# Patient Record
Sex: Female | Born: 1935 | Race: White | Hispanic: No | Marital: Married | Smoking: Never smoker
Health system: Southern US, Community
[De-identification: ages and names within clinical notes are randomized; demographics above are authoritative.]

## PROBLEM LIST (undated history)

## (undated) DIAGNOSIS — I350 Nonrheumatic aortic (valve) stenosis: Secondary | ICD-10-CM

## (undated) DIAGNOSIS — M1990 Unspecified osteoarthritis, unspecified site: Secondary | ICD-10-CM

## (undated) DIAGNOSIS — R519 Headache, unspecified: Secondary | ICD-10-CM

## (undated) DIAGNOSIS — C679 Malignant neoplasm of bladder, unspecified: Secondary | ICD-10-CM

## (undated) DIAGNOSIS — G479 Sleep disorder, unspecified: Secondary | ICD-10-CM

## (undated) DIAGNOSIS — R51 Headache: Secondary | ICD-10-CM

## (undated) DIAGNOSIS — E785 Hyperlipidemia, unspecified: Secondary | ICD-10-CM

## (undated) DIAGNOSIS — I4891 Unspecified atrial fibrillation: Secondary | ICD-10-CM

## (undated) DIAGNOSIS — I5030 Unspecified diastolic (congestive) heart failure: Secondary | ICD-10-CM

## (undated) DIAGNOSIS — Z9289 Personal history of other medical treatment: Secondary | ICD-10-CM

## (undated) DIAGNOSIS — I872 Venous insufficiency (chronic) (peripheral): Secondary | ICD-10-CM

## (undated) DIAGNOSIS — I341 Nonrheumatic mitral (valve) prolapse: Secondary | ICD-10-CM

## (undated) DIAGNOSIS — I2510 Atherosclerotic heart disease of native coronary artery without angina pectoris: Secondary | ICD-10-CM

## (undated) DIAGNOSIS — M25511 Pain in right shoulder: Secondary | ICD-10-CM

## (undated) DIAGNOSIS — K219 Gastro-esophageal reflux disease without esophagitis: Secondary | ICD-10-CM

## (undated) HISTORY — DX: Atherosclerotic heart disease of native coronary artery without angina pectoris: I25.10

## (undated) HISTORY — DX: Venous insufficiency (chronic) (peripheral): I87.2

## (undated) HISTORY — PX: CARDIOVERSION: SHX1299

## (undated) HISTORY — PX: CATARACT EXTRACTION: SUR2

## (undated) HISTORY — DX: Hyperlipidemia, unspecified: E78.5

## (undated) HISTORY — PX: BACK SURGERY: SHX140

---

## 1999-03-07 ENCOUNTER — Ambulatory Visit (HOSPITAL_COMMUNITY): Admission: RE | Admit: 1999-03-07 | Discharge: 1999-03-07 | Payer: Self-pay | Admitting: Cardiology

## 1999-09-04 ENCOUNTER — Encounter: Payer: Self-pay | Admitting: Cardiology

## 1999-09-04 ENCOUNTER — Ambulatory Visit (HOSPITAL_COMMUNITY): Admission: RE | Admit: 1999-09-04 | Discharge: 1999-09-04 | Payer: Self-pay | Admitting: Cardiology

## 2000-11-04 ENCOUNTER — Encounter: Payer: Self-pay | Admitting: Family Medicine

## 2000-11-04 ENCOUNTER — Ambulatory Visit (HOSPITAL_COMMUNITY): Admission: RE | Admit: 2000-11-04 | Discharge: 2000-11-04 | Payer: Self-pay | Admitting: Family Medicine

## 2001-02-22 ENCOUNTER — Other Ambulatory Visit: Admission: RE | Admit: 2001-02-22 | Discharge: 2001-02-22 | Payer: Self-pay | Admitting: Family Medicine

## 2001-04-15 ENCOUNTER — Ambulatory Visit (HOSPITAL_COMMUNITY): Admission: RE | Admit: 2001-04-15 | Discharge: 2001-04-15 | Payer: Self-pay | Admitting: Family Medicine

## 2001-04-15 ENCOUNTER — Encounter: Payer: Self-pay | Admitting: Family Medicine

## 2001-04-22 ENCOUNTER — Ambulatory Visit (HOSPITAL_COMMUNITY): Admission: RE | Admit: 2001-04-22 | Discharge: 2001-04-22 | Payer: Self-pay | Admitting: Cardiology

## 2001-04-22 ENCOUNTER — Encounter: Payer: Self-pay | Admitting: Cardiology

## 2001-11-08 ENCOUNTER — Ambulatory Visit (HOSPITAL_COMMUNITY): Admission: RE | Admit: 2001-11-08 | Discharge: 2001-11-08 | Payer: Self-pay | Admitting: Family Medicine

## 2001-11-08 ENCOUNTER — Encounter: Payer: Self-pay | Admitting: Family Medicine

## 2002-02-21 ENCOUNTER — Ambulatory Visit (HOSPITAL_COMMUNITY): Admission: RE | Admit: 2002-02-21 | Discharge: 2002-02-21 | Payer: Self-pay | Admitting: Family Medicine

## 2002-02-21 ENCOUNTER — Encounter: Payer: Self-pay | Admitting: Family Medicine

## 2002-05-06 ENCOUNTER — Encounter: Payer: Self-pay | Admitting: Internal Medicine

## 2002-05-06 ENCOUNTER — Observation Stay (HOSPITAL_COMMUNITY): Admission: EM | Admit: 2002-05-06 | Discharge: 2002-05-07 | Payer: Self-pay | Admitting: Cardiology

## 2002-09-18 ENCOUNTER — Observation Stay (HOSPITAL_COMMUNITY): Admission: EM | Admit: 2002-09-18 | Discharge: 2002-09-20 | Payer: Self-pay | Admitting: Emergency Medicine

## 2002-09-18 ENCOUNTER — Encounter: Payer: Self-pay | Admitting: Emergency Medicine

## 2002-10-26 ENCOUNTER — Ambulatory Visit (HOSPITAL_COMMUNITY): Admission: RE | Admit: 2002-10-26 | Discharge: 2002-10-26 | Payer: Self-pay | Admitting: Family Medicine

## 2002-10-26 ENCOUNTER — Encounter: Payer: Self-pay | Admitting: Family Medicine

## 2002-10-31 ENCOUNTER — Ambulatory Visit (HOSPITAL_COMMUNITY): Admission: RE | Admit: 2002-10-31 | Discharge: 2002-10-31 | Payer: Self-pay | Admitting: Family Medicine

## 2002-10-31 ENCOUNTER — Encounter: Payer: Self-pay | Admitting: Family Medicine

## 2003-03-27 ENCOUNTER — Emergency Department (HOSPITAL_COMMUNITY): Admission: EM | Admit: 2003-03-27 | Discharge: 2003-03-27 | Payer: Self-pay | Admitting: *Deleted

## 2003-03-27 ENCOUNTER — Encounter: Payer: Self-pay | Admitting: *Deleted

## 2003-05-19 ENCOUNTER — Encounter: Payer: Self-pay | Admitting: Cardiology

## 2003-10-11 ENCOUNTER — Ambulatory Visit (HOSPITAL_COMMUNITY): Admission: RE | Admit: 2003-10-11 | Discharge: 2003-10-11 | Payer: Self-pay | Admitting: Specialist

## 2003-10-18 ENCOUNTER — Ambulatory Visit (HOSPITAL_COMMUNITY): Admission: RE | Admit: 2003-10-18 | Discharge: 2003-10-18 | Payer: Self-pay | Admitting: Family Medicine

## 2003-10-30 ENCOUNTER — Ambulatory Visit (HOSPITAL_COMMUNITY): Admission: RE | Admit: 2003-10-30 | Discharge: 2003-10-30 | Payer: Self-pay | Admitting: Family Medicine

## 2003-11-15 ENCOUNTER — Ambulatory Visit (HOSPITAL_COMMUNITY): Admission: RE | Admit: 2003-11-15 | Discharge: 2003-11-15 | Payer: Self-pay | Admitting: Specialist

## 2004-05-24 ENCOUNTER — Ambulatory Visit: Payer: Self-pay | Admitting: Cardiovascular Disease

## 2004-06-18 ENCOUNTER — Ambulatory Visit: Payer: Self-pay | Admitting: Cardiology

## 2004-06-27 ENCOUNTER — Ambulatory Visit: Payer: Self-pay | Admitting: Cardiology

## 2004-07-02 ENCOUNTER — Ambulatory Visit: Payer: Self-pay | Admitting: Cardiology

## 2004-07-23 ENCOUNTER — Ambulatory Visit: Payer: Self-pay | Admitting: *Deleted

## 2004-08-01 ENCOUNTER — Ambulatory Visit: Payer: Self-pay | Admitting: Internal Medicine

## 2004-08-15 ENCOUNTER — Ambulatory Visit: Payer: Self-pay | Admitting: Internal Medicine

## 2004-08-29 ENCOUNTER — Ambulatory Visit: Payer: Self-pay | Admitting: Cardiology

## 2004-09-19 ENCOUNTER — Ambulatory Visit: Payer: Self-pay | Admitting: Cardiology

## 2004-10-03 ENCOUNTER — Ambulatory Visit: Payer: Self-pay | Admitting: Cardiology

## 2004-10-15 ENCOUNTER — Ambulatory Visit: Payer: Self-pay

## 2004-10-17 ENCOUNTER — Ambulatory Visit: Payer: Self-pay | Admitting: Internal Medicine

## 2004-10-23 ENCOUNTER — Ambulatory Visit: Payer: Self-pay | Admitting: Cardiology

## 2004-10-31 ENCOUNTER — Ambulatory Visit: Payer: Self-pay

## 2004-11-07 ENCOUNTER — Ambulatory Visit (HOSPITAL_COMMUNITY): Admission: RE | Admit: 2004-11-07 | Discharge: 2004-11-07 | Payer: Self-pay | Admitting: Family Medicine

## 2004-11-14 ENCOUNTER — Ambulatory Visit: Payer: Self-pay | Admitting: Cardiology

## 2004-11-19 ENCOUNTER — Ambulatory Visit: Payer: Self-pay | Admitting: Cardiology

## 2004-11-20 ENCOUNTER — Ambulatory Visit: Payer: Self-pay | Admitting: Orthopedic Surgery

## 2004-12-12 ENCOUNTER — Ambulatory Visit: Payer: Self-pay | Admitting: Cardiology

## 2005-01-09 ENCOUNTER — Ambulatory Visit: Payer: Self-pay | Admitting: Cardiology

## 2005-01-23 ENCOUNTER — Ambulatory Visit: Payer: Self-pay | Admitting: Internal Medicine

## 2005-02-20 ENCOUNTER — Ambulatory Visit: Payer: Self-pay | Admitting: Internal Medicine

## 2005-03-13 ENCOUNTER — Ambulatory Visit: Payer: Self-pay | Admitting: Cardiology

## 2005-03-20 ENCOUNTER — Ambulatory Visit: Payer: Self-pay | Admitting: Cardiology

## 2005-03-27 ENCOUNTER — Ambulatory Visit: Payer: Self-pay | Admitting: Cardiology

## 2005-04-02 ENCOUNTER — Ambulatory Visit (HOSPITAL_COMMUNITY): Admission: RE | Admit: 2005-04-02 | Discharge: 2005-04-02 | Payer: Self-pay | Admitting: Family Medicine

## 2005-04-10 ENCOUNTER — Ambulatory Visit: Payer: Self-pay | Admitting: Cardiology

## 2005-05-01 ENCOUNTER — Ambulatory Visit: Payer: Self-pay | Admitting: Cardiology

## 2005-05-29 ENCOUNTER — Ambulatory Visit: Payer: Self-pay | Admitting: Cardiology

## 2005-06-26 ENCOUNTER — Ambulatory Visit: Payer: Self-pay | Admitting: Cardiology

## 2005-07-09 ENCOUNTER — Ambulatory Visit: Payer: Self-pay | Admitting: Internal Medicine

## 2005-07-24 ENCOUNTER — Ambulatory Visit: Payer: Self-pay | Admitting: Cardiology

## 2005-08-06 ENCOUNTER — Ambulatory Visit: Payer: Self-pay | Admitting: Cardiology

## 2005-09-03 ENCOUNTER — Ambulatory Visit: Payer: Self-pay | Admitting: Internal Medicine

## 2005-10-01 ENCOUNTER — Ambulatory Visit: Payer: Self-pay | Admitting: Cardiology

## 2005-10-22 ENCOUNTER — Ambulatory Visit: Payer: Self-pay | Admitting: Cardiology

## 2005-11-05 ENCOUNTER — Ambulatory Visit: Payer: Self-pay | Admitting: *Deleted

## 2005-11-26 ENCOUNTER — Ambulatory Visit: Payer: Self-pay | Admitting: Cardiology

## 2005-12-24 ENCOUNTER — Ambulatory Visit: Payer: Self-pay | Admitting: Cardiology

## 2006-01-20 ENCOUNTER — Ambulatory Visit: Payer: Self-pay | Admitting: Internal Medicine

## 2006-01-20 ENCOUNTER — Ambulatory Visit: Payer: Self-pay

## 2006-02-09 ENCOUNTER — Ambulatory Visit: Payer: Self-pay | Admitting: Cardiology

## 2006-03-10 ENCOUNTER — Ambulatory Visit: Payer: Self-pay | Admitting: Cardiology

## 2006-04-06 ENCOUNTER — Ambulatory Visit (HOSPITAL_COMMUNITY): Admission: RE | Admit: 2006-04-06 | Discharge: 2006-04-06 | Payer: Self-pay | Admitting: Family Medicine

## 2006-04-07 ENCOUNTER — Ambulatory Visit: Payer: Self-pay | Admitting: Cardiology

## 2006-04-24 ENCOUNTER — Ambulatory Visit: Payer: Self-pay | Admitting: Cardiovascular Disease

## 2006-05-15 ENCOUNTER — Ambulatory Visit: Payer: Self-pay | Admitting: Cardiology

## 2006-05-20 ENCOUNTER — Ambulatory Visit: Payer: Self-pay | Admitting: Cardiology

## 2006-06-17 ENCOUNTER — Ambulatory Visit: Payer: Self-pay | Admitting: Cardiovascular Disease

## 2006-07-17 ENCOUNTER — Ambulatory Visit: Payer: Self-pay | Admitting: Cardiology

## 2006-08-14 ENCOUNTER — Ambulatory Visit: Payer: Self-pay | Admitting: Internal Medicine

## 2006-09-04 ENCOUNTER — Ambulatory Visit: Payer: Self-pay | Admitting: Cardiology

## 2006-10-02 ENCOUNTER — Ambulatory Visit: Payer: Self-pay | Admitting: Cardiovascular Disease

## 2006-11-02 ENCOUNTER — Ambulatory Visit: Payer: Self-pay | Admitting: Cardiology

## 2006-11-02 LAB — CONVERTED CEMR LAB
BUN: 25 mg/dL — ABNORMAL HIGH (ref 6–23)
Basophils Relative: 0.6 % (ref 0.0–1.0)
CO2: 31 meq/L (ref 19–32)
Creatinine, Ser: 1 mg/dL (ref 0.4–1.2)
Eosinophils Relative: 1.9 % (ref 0.0–5.0)
GFR calc Af Amer: 69 mL/min
GFR calc non Af Amer: 57 mL/min
Glucose, Bld: 70 mg/dL (ref 70–99)
MCV: 87.4 fL (ref 78.0–100.0)
Monocytes Absolute: 0.6 10*3/uL (ref 0.2–0.7)
Neutrophils Relative %: 56.9 % (ref 43.0–77.0)
Platelets: 218 10*3/uL (ref 150–400)
Potassium: 4.7 meq/L (ref 3.5–5.1)
RBC: 3.98 M/uL (ref 3.87–5.11)
RDW: 14.9 % — ABNORMAL HIGH (ref 11.5–14.6)
Sodium: 143 meq/L (ref 135–145)

## 2006-11-16 ENCOUNTER — Encounter: Payer: Self-pay | Admitting: Cardiology

## 2006-11-16 ENCOUNTER — Ambulatory Visit: Payer: Self-pay

## 2006-11-16 ENCOUNTER — Ambulatory Visit: Payer: Self-pay | Admitting: Cardiology

## 2006-11-16 LAB — CONVERTED CEMR LAB
BUN: 37 mg/dL — ABNORMAL HIGH (ref 6–23)
CO2: 33 meq/L — ABNORMAL HIGH (ref 19–32)
Creatinine, Ser: 1.2 mg/dL (ref 0.4–1.2)
GFR calc Af Amer: 56 mL/min
Glucose, Bld: 77 mg/dL (ref 70–99)

## 2006-12-01 ENCOUNTER — Ambulatory Visit: Payer: Self-pay | Admitting: Cardiology

## 2006-12-28 ENCOUNTER — Ambulatory Visit: Payer: Self-pay | Admitting: Internal Medicine

## 2007-01-19 ENCOUNTER — Ambulatory Visit: Payer: Self-pay | Admitting: Cardiology

## 2007-02-09 ENCOUNTER — Ambulatory Visit: Payer: Self-pay | Admitting: Internal Medicine

## 2007-02-23 ENCOUNTER — Ambulatory Visit: Payer: Self-pay | Admitting: Cardiology

## 2007-03-16 ENCOUNTER — Ambulatory Visit: Payer: Self-pay | Admitting: Cardiology

## 2007-04-06 ENCOUNTER — Ambulatory Visit: Payer: Self-pay | Admitting: Cardiology

## 2007-04-08 ENCOUNTER — Ambulatory Visit (HOSPITAL_COMMUNITY): Admission: RE | Admit: 2007-04-08 | Discharge: 2007-04-08 | Payer: Self-pay | Admitting: Family Medicine

## 2007-04-21 ENCOUNTER — Ambulatory Visit: Payer: Self-pay | Admitting: Cardiology

## 2007-04-21 LAB — CONVERTED CEMR LAB
Basophils Absolute: 0 10*3/uL (ref 0.0–0.1)
Basophils Relative: 0.1 % (ref 0.0–1.0)
Calcium: 8.9 mg/dL (ref 8.4–10.5)
Chloride: 102 meq/L (ref 96–112)
Creatinine, Ser: 1.1 mg/dL (ref 0.4–1.2)
Eosinophils Relative: 1.1 % (ref 0.0–5.0)
GFR calc Af Amer: 62 mL/min
HCT: 38.6 % (ref 36.0–46.0)
Monocytes Absolute: 0.7 10*3/uL (ref 0.2–0.7)
Monocytes Relative: 9 % (ref 3.0–11.0)
Neutro Abs: 5.2 10*3/uL (ref 1.4–7.7)
Potassium: 4.4 meq/L (ref 3.5–5.1)
RBC: 4.22 M/uL (ref 3.87–5.11)
WBC: 7.9 10*3/uL (ref 4.5–10.5)

## 2007-04-28 ENCOUNTER — Ambulatory Visit: Payer: Self-pay | Admitting: Internal Medicine

## 2007-05-12 ENCOUNTER — Ambulatory Visit: Payer: Self-pay | Admitting: Cardiology

## 2007-05-26 ENCOUNTER — Ambulatory Visit: Payer: Self-pay | Admitting: Cardiology

## 2007-06-23 ENCOUNTER — Ambulatory Visit: Payer: Self-pay | Admitting: Cardiology

## 2007-07-06 ENCOUNTER — Ambulatory Visit: Payer: Self-pay | Admitting: Internal Medicine

## 2007-07-28 ENCOUNTER — Ambulatory Visit: Payer: Self-pay | Admitting: Cardiovascular Disease

## 2007-08-12 ENCOUNTER — Ambulatory Visit: Payer: Self-pay | Admitting: Cardiology

## 2007-09-02 ENCOUNTER — Ambulatory Visit: Payer: Self-pay | Admitting: Cardiology

## 2007-09-30 ENCOUNTER — Ambulatory Visit: Payer: Self-pay | Admitting: Cardiology

## 2007-10-28 ENCOUNTER — Ambulatory Visit: Payer: Self-pay | Admitting: Internal Medicine

## 2007-10-28 ENCOUNTER — Ambulatory Visit: Payer: Self-pay | Admitting: Cardiology

## 2007-10-28 LAB — CONVERTED CEMR LAB
Basophils Relative: 0 % (ref 0.0–1.0)
Calcium: 9.2 mg/dL (ref 8.4–10.5)
Chloride: 102 meq/L (ref 96–112)
Creatinine, Ser: 1.5 mg/dL — ABNORMAL HIGH (ref 0.4–1.2)
Eosinophils Relative: 1.8 % (ref 0.0–5.0)
GFR calc Af Amer: 43 mL/min
GFR calc non Af Amer: 36 mL/min
Glucose, Bld: 103 mg/dL — ABNORMAL HIGH (ref 70–99)
HCT: 36.3 % (ref 36.0–46.0)
Hemoglobin: 11.8 g/dL — ABNORMAL LOW (ref 12.0–15.0)
MCHC: 32.5 g/dL (ref 30.0–36.0)
MCV: 89.7 fL (ref 78.0–100.0)
Monocytes Relative: 9.9 % (ref 3.0–12.0)
Neutro Abs: 2.7 10*3/uL (ref 1.4–7.7)
Neutrophils Relative %: 52.1 % (ref 43.0–77.0)
RBC: 4.05 M/uL (ref 3.87–5.11)
RDW: 13.2 % (ref 11.5–14.6)

## 2007-11-11 ENCOUNTER — Ambulatory Visit: Payer: Self-pay | Admitting: Internal Medicine

## 2007-11-25 ENCOUNTER — Ambulatory Visit: Payer: Self-pay | Admitting: Internal Medicine

## 2007-12-16 ENCOUNTER — Ambulatory Visit: Payer: Self-pay | Admitting: Cardiology

## 2008-01-13 ENCOUNTER — Ambulatory Visit: Payer: Self-pay | Admitting: Cardiology

## 2008-02-10 ENCOUNTER — Ambulatory Visit: Payer: Self-pay | Admitting: Internal Medicine

## 2008-03-02 ENCOUNTER — Ambulatory Visit: Payer: Self-pay | Admitting: Internal Medicine

## 2008-03-16 ENCOUNTER — Ambulatory Visit: Payer: Self-pay | Admitting: Cardiology

## 2008-03-31 ENCOUNTER — Ambulatory Visit (HOSPITAL_COMMUNITY): Admission: RE | Admit: 2008-03-31 | Discharge: 2008-03-31 | Payer: Self-pay | Admitting: Family Medicine

## 2008-04-06 ENCOUNTER — Ambulatory Visit: Payer: Self-pay | Admitting: Cardiology

## 2008-04-11 ENCOUNTER — Ambulatory Visit (HOSPITAL_COMMUNITY): Admission: RE | Admit: 2008-04-11 | Discharge: 2008-04-11 | Payer: Self-pay | Admitting: Family Medicine

## 2008-04-27 ENCOUNTER — Ambulatory Visit: Payer: Self-pay | Admitting: Cardiology

## 2008-05-25 ENCOUNTER — Ambulatory Visit: Payer: Self-pay | Admitting: Cardiology

## 2008-06-16 ENCOUNTER — Ambulatory Visit: Payer: Self-pay | Admitting: Internal Medicine

## 2008-07-17 ENCOUNTER — Ambulatory Visit: Payer: Self-pay | Admitting: Cardiology

## 2008-08-08 ENCOUNTER — Ambulatory Visit (HOSPITAL_COMMUNITY): Admission: RE | Admit: 2008-08-08 | Discharge: 2008-08-08 | Payer: Self-pay | Admitting: Family Medicine

## 2008-08-14 ENCOUNTER — Ambulatory Visit: Payer: Self-pay | Admitting: Cardiology

## 2008-09-20 ENCOUNTER — Encounter (INDEPENDENT_AMBULATORY_CARE_PROVIDER_SITE_OTHER): Payer: Self-pay | Admitting: *Deleted

## 2008-09-21 ENCOUNTER — Encounter: Payer: Self-pay | Admitting: Cardiology

## 2008-09-21 ENCOUNTER — Ambulatory Visit: Payer: Self-pay | Admitting: Cardiovascular Disease

## 2008-09-21 ENCOUNTER — Ambulatory Visit: Payer: Self-pay | Admitting: Cardiology

## 2008-10-12 ENCOUNTER — Ambulatory Visit: Payer: Self-pay | Admitting: Cardiology

## 2008-10-12 LAB — CONVERTED CEMR LAB
ALT: 14 units/L (ref 0–35)
AST: 19 units/L (ref 0–37)
Albumin: 3.5 g/dL (ref 3.5–5.2)
Alkaline Phosphatase: 75 units/L (ref 39–117)
BUN: 41 mg/dL — ABNORMAL HIGH (ref 6–23)
Basophils Absolute: 0 10*3/uL (ref 0.0–0.1)
Basophils Relative: 0.1 % (ref 0.0–3.0)
CO2: 32 meq/L (ref 19–32)
Calcium: 9.2 mg/dL (ref 8.4–10.5)
Chloride: 102 meq/L (ref 96–112)
GFR calc non Af Amer: 38.63 mL/min (ref >60)
Glucose, Bld: 106 mg/dL — ABNORMAL HIGH (ref 70–99)
HDL: 56.5 mg/dL (ref >39.00)
Monocytes Relative: 13.4 % — ABNORMAL HIGH (ref 3.0–12.0)
Neutro Abs: 2.3 10*3/uL (ref 1.4–7.7)
Platelets: 162 10*3/uL (ref 150.0–400.0)
Triglycerides: 78 mg/dL (ref 0.0–149.0)
WBC: 4.5 10*3/uL (ref 4.5–10.5)

## 2008-11-06 ENCOUNTER — Ambulatory Visit: Payer: Self-pay | Admitting: Cardiovascular Disease

## 2008-11-20 ENCOUNTER — Ambulatory Visit: Payer: Self-pay | Admitting: Cardiology

## 2008-12-19 ENCOUNTER — Encounter: Payer: Self-pay | Admitting: *Deleted

## 2008-12-20 ENCOUNTER — Ambulatory Visit: Payer: Self-pay | Admitting: Cardiology

## 2008-12-20 LAB — CONVERTED CEMR LAB
POC INR: 1.9
Protime: 1.9

## 2009-01-10 ENCOUNTER — Ambulatory Visit: Payer: Self-pay | Admitting: Internal Medicine

## 2009-01-24 ENCOUNTER — Encounter: Payer: Self-pay | Admitting: *Deleted

## 2009-01-31 ENCOUNTER — Ambulatory Visit: Payer: Self-pay | Admitting: Internal Medicine

## 2009-01-31 LAB — CONVERTED CEMR LAB: POC INR: 1.7

## 2009-02-14 ENCOUNTER — Ambulatory Visit: Payer: Self-pay | Admitting: Cardiology

## 2009-02-14 LAB — CONVERTED CEMR LAB: Prothrombin Time: 18.1 s

## 2009-03-14 ENCOUNTER — Ambulatory Visit: Payer: Self-pay | Admitting: Internal Medicine

## 2009-04-11 ENCOUNTER — Ambulatory Visit: Payer: Self-pay | Admitting: Internal Medicine

## 2009-04-11 LAB — CONVERTED CEMR LAB: POC INR: 2.2

## 2009-04-13 ENCOUNTER — Ambulatory Visit (HOSPITAL_COMMUNITY): Admission: RE | Admit: 2009-04-13 | Discharge: 2009-04-13 | Payer: Self-pay | Admitting: Family Medicine

## 2009-05-09 ENCOUNTER — Ambulatory Visit: Payer: Self-pay | Admitting: Internal Medicine

## 2009-05-29 ENCOUNTER — Telehealth: Payer: Self-pay | Admitting: Cardiology

## 2009-06-06 ENCOUNTER — Ambulatory Visit: Payer: Self-pay | Admitting: Cardiovascular Disease

## 2009-06-06 LAB — CONVERTED CEMR LAB: POC INR: 1.8

## 2009-06-08 ENCOUNTER — Telehealth: Payer: Self-pay | Admitting: Cardiology

## 2009-06-26 ENCOUNTER — Telehealth (INDEPENDENT_AMBULATORY_CARE_PROVIDER_SITE_OTHER): Payer: Self-pay | Admitting: *Deleted

## 2009-07-04 ENCOUNTER — Encounter (INDEPENDENT_AMBULATORY_CARE_PROVIDER_SITE_OTHER): Payer: Self-pay | Admitting: Cardiology

## 2009-07-04 ENCOUNTER — Ambulatory Visit: Payer: Self-pay | Admitting: Cardiology

## 2009-07-04 LAB — CONVERTED CEMR LAB: POC INR: 1.8

## 2009-07-25 ENCOUNTER — Encounter (INDEPENDENT_AMBULATORY_CARE_PROVIDER_SITE_OTHER): Payer: Self-pay | Admitting: Cardiology

## 2009-07-25 ENCOUNTER — Ambulatory Visit: Payer: Self-pay | Admitting: Cardiovascular Disease

## 2009-07-25 LAB — CONVERTED CEMR LAB: POC INR: 2.1

## 2009-08-22 ENCOUNTER — Ambulatory Visit: Payer: Self-pay | Admitting: Internal Medicine

## 2009-08-22 LAB — CONVERTED CEMR LAB: POC INR: 2.3

## 2009-09-20 DIAGNOSIS — I5031 Acute diastolic (congestive) heart failure: Secondary | ICD-10-CM | POA: Insufficient documentation

## 2009-09-20 DIAGNOSIS — I2510 Atherosclerotic heart disease of native coronary artery without angina pectoris: Secondary | ICD-10-CM | POA: Insufficient documentation

## 2009-09-20 DIAGNOSIS — E785 Hyperlipidemia, unspecified: Secondary | ICD-10-CM

## 2009-09-20 DIAGNOSIS — I10 Essential (primary) hypertension: Secondary | ICD-10-CM

## 2009-09-20 DIAGNOSIS — I4891 Unspecified atrial fibrillation: Secondary | ICD-10-CM

## 2009-09-20 DIAGNOSIS — I872 Venous insufficiency (chronic) (peripheral): Secondary | ICD-10-CM | POA: Insufficient documentation

## 2009-09-21 ENCOUNTER — Ambulatory Visit: Payer: Self-pay | Admitting: Cardiology

## 2009-10-19 ENCOUNTER — Ambulatory Visit: Payer: Self-pay | Admitting: Internal Medicine

## 2009-10-19 LAB — CONVERTED CEMR LAB: POC INR: 2

## 2009-10-22 ENCOUNTER — Ambulatory Visit: Payer: Self-pay | Admitting: Cardiology

## 2009-10-26 LAB — CONVERTED CEMR LAB
Albumin: 3.7 g/dL (ref 3.5–5.2)
Calcium: 9.2 mg/dL (ref 8.4–10.5)
Cholesterol: 123 mg/dL (ref 0–200)
Eosinophils Relative: 2.7 % (ref 0.0–5.0)
GFR calc non Af Amer: 46.03 mL/min (ref >60)
Glucose, Bld: 109 mg/dL — ABNORMAL HIGH (ref 70–99)
HCT: 35 % — ABNORMAL LOW (ref 36.0–46.0)
HDL: 61.7 mg/dL (ref >39.00)
Hemoglobin: 12 g/dL (ref 12.0–15.0)
LDL Cholesterol: 50 mg/dL (ref 0–99)
Monocytes Absolute: 0.4 10*3/uL (ref 0.1–1.0)
Monocytes Relative: 10.6 % (ref 3.0–12.0)
Neutrophils Relative %: 50.8 % (ref 43.0–77.0)
Platelets: 163 10*3/uL (ref 150.0–400.0)
Potassium: 4 meq/L (ref 3.5–5.1)
RBC: 3.42 M/uL — ABNORMAL LOW (ref 3.87–5.11)
RDW: 14.6 % (ref 11.5–14.6)
Triglycerides: 58 mg/dL (ref 0.0–149.0)
VLDL: 11.6 mg/dL (ref 0.0–40.0)

## 2009-11-16 ENCOUNTER — Ambulatory Visit: Payer: Self-pay | Admitting: Cardiology

## 2009-11-16 LAB — CONVERTED CEMR LAB: POC INR: 2.6

## 2009-11-26 ENCOUNTER — Telehealth: Payer: Self-pay | Admitting: Cardiology

## 2009-12-14 ENCOUNTER — Ambulatory Visit: Payer: Self-pay | Admitting: Cardiology

## 2009-12-14 LAB — CONVERTED CEMR LAB: POC INR: 2.7

## 2010-01-11 ENCOUNTER — Ambulatory Visit: Payer: Self-pay | Admitting: Cardiology

## 2010-01-11 LAB — CONVERTED CEMR LAB: POC INR: 2.4

## 2010-02-08 ENCOUNTER — Ambulatory Visit: Payer: Self-pay | Admitting: Cardiology

## 2010-02-08 LAB — CONVERTED CEMR LAB: POC INR: 2.2

## 2010-03-08 ENCOUNTER — Ambulatory Visit: Payer: Self-pay | Admitting: Cardiology

## 2010-03-26 ENCOUNTER — Encounter: Payer: Self-pay | Admitting: Cardiology

## 2010-04-08 ENCOUNTER — Encounter: Payer: Self-pay | Admitting: Cardiology

## 2010-04-10 ENCOUNTER — Ambulatory Visit: Payer: Self-pay | Admitting: Cardiology

## 2010-04-11 LAB — CONVERTED CEMR LAB: TSH: 2.53 microintl units/mL (ref 0.35–5.50)

## 2010-04-15 ENCOUNTER — Ambulatory Visit (HOSPITAL_COMMUNITY): Admission: RE | Admit: 2010-04-15 | Discharge: 2010-04-15 | Payer: Self-pay | Admitting: Family Medicine

## 2010-04-23 ENCOUNTER — Ambulatory Visit (HOSPITAL_COMMUNITY): Admission: RE | Admit: 2010-04-23 | Discharge: 2010-04-23 | Payer: Self-pay | Admitting: Family Medicine

## 2010-04-29 ENCOUNTER — Ambulatory Visit (HOSPITAL_COMMUNITY): Admission: RE | Admit: 2010-04-29 | Discharge: 2010-04-29 | Payer: Self-pay | Admitting: Family Medicine

## 2010-05-10 ENCOUNTER — Ambulatory Visit: Payer: Self-pay | Admitting: Cardiovascular Disease

## 2010-05-10 ENCOUNTER — Encounter: Payer: Self-pay | Admitting: Cardiology

## 2010-05-10 ENCOUNTER — Ambulatory Visit: Payer: Self-pay

## 2010-05-10 ENCOUNTER — Ambulatory Visit: Payer: Self-pay | Admitting: Internal Medicine

## 2010-05-10 ENCOUNTER — Ambulatory Visit (HOSPITAL_COMMUNITY): Admission: RE | Admit: 2010-05-10 | Discharge: 2010-05-10 | Payer: Self-pay | Admitting: Cardiology

## 2010-05-10 LAB — CONVERTED CEMR LAB: POC INR: 2.4

## 2010-06-07 ENCOUNTER — Ambulatory Visit: Payer: Self-pay | Admitting: Cardiology

## 2010-07-05 ENCOUNTER — Ambulatory Visit: Payer: Self-pay | Admitting: Cardiology

## 2010-07-25 ENCOUNTER — Telehealth: Payer: Self-pay | Admitting: Cardiovascular Disease

## 2010-08-02 ENCOUNTER — Ambulatory Visit: Admission: RE | Admit: 2010-08-02 | Discharge: 2010-08-02 | Payer: Self-pay | Source: Home / Self Care

## 2010-08-18 LAB — CONVERTED CEMR LAB: POC INR: 2.4

## 2010-08-22 NOTE — Letter (Signed)
Summary: Handout Printed  Printed Handout:  - Coumadin Instructions-w/out Meds 

## 2010-08-22 NOTE — Progress Notes (Signed)
Summary: PT CALLING ABOUT LAB WORK   Phone Note Call from Patient Call back at Home Phone [phone]   Caller: Patient Summary of Call: Pt calling regarding lab work. Initial call taken by: Judie Grieve,  Nov 26, 2009 1:13 PM  Follow-up for Phone Call        I spoke with patient and she would like her recent lab work faxed to Dr. Megan Mans.  I will fax that toda

## 2010-08-22 NOTE — Medication Information (Signed)
Summary: [email or web address]  Anticoagulant Therapy  Managed by: Shelby Dubin, PharmD, BCPS, CPP Referring MD: Charlies Constable MD Supervising MD: Clifton James MD, Cristal Deer Indication 1: Atrial Fibrillation (ICD-427.31) Lab Used: [facility] Site: [facility] INR POC 2.1 INR RANGE 2 - 3  Dietary changes: no    Health status changes: no    Bleeding/hemorrhagic complications: no    Recent/future hospitalizations: no    Any changes in medication regimen? no    Recent/future dental: no  Any missed doses?: no       Is patient compliant with meds? yes       Current Medications (verified): 1)  Metoprolol Succinate 50 Mg Xr24h-Tab (Metoprolol Succinate) .... Take 1  Tab Daily and An Extra Half At Night If Needed 2)  Diltiazem Hcl 120 Mg Tabs (Diltiazem Hcl) .... One Tab A Day 3)  Furosemide 80 Mg Tabs (Furosemide) .... Take One Tablet By Mouth Daily. 4)  Aspirin 81 Mg Tbec (Aspirin) .... Take One Tablet By Mouth Daily 5)  Enalapril Maleate 10 Mg Tabs (Enalapril Maleate) 6)  Crestor 20 Mg Tabs (Rosuvastatin Calcium) .... Take One Tablet By Mouth Daily. 7)  Niaspan 500 Mg Cr-Tabs (Niacin (Antihyperlipidemic)) .Marland Kitchen.. 1 Tab Daily 8)  Allopurinol 100 Mg Tabs (Allopurinol) .Marland Kitchen.. 1 By Mouth Daily 9)  Calcium With Vitamin D 600mg  .... One By Mouth Daily 10)  Coumadin 5 Mg Tabs (Warfarin Sodium) .... Take As Directed By Coumadin Clinic.  Allergies (verified): 1)  ! Pcn 2)  ! Amoxicillin 3)  ! Sulfa  Anticoagulation Management History:      The patient is taking warfarin and comes in today for a routine follow up visit.  Positive risk factors for bleeding include an age of 61 years or older.  The bleeding index is 'intermediate risk'.  Positive CHADS2 values include Age > 25 years old.  The start date was 06/03/2002.  Anticoagulation responsible provider: Clifton James MD, Cristal Deer.  INR POC: 2.1.  Cuvette Lot#: [id number].  Exp: 08/2010.    Anticoagulation Management Assessment/Plan:      The patient's  current anticoagulation dose is Coumadin 5 mg tabs: Take as directed by coumadin clinic..  The target INR is 2.0-3.0.  The next INR is due 08/22/2009.  Anticoagulation instructions were given to patient.  Results were reviewed/authorized by Shelby Dubin, PharmD, BCPS, CPP.  She was notified by Shelby Dubin PharmD, BCPS, CPP.         Prior Anticoagulation Instructions: INR 1.8  Take 1 tab today, then resume normal dosing of 0.5 tab daily except on Tuesdays and Saturdays.  Recheck in 3 weeks.    Current Anticoagulation Instructions: INR 2.1  Continue taking 0.5 tab daily except 1 tab each Tuesday and Saturday.  Recheck in 4 weeks.

## 2010-08-22 NOTE — Medication Information (Signed)
Summary: rov/cb  Anticoagulant Therapy  Managed by: Bethena Midget, RN, BSN Referring MD: Charlies Constable MD PCP: Soyla Murphy Supervising MD: Juanda Chance MD, Elayna Tobler Indication 1: Atrial Fibrillation (ICD-427.31) Lab Used: [facility] Site: [facility] INR POC 2.6 INR RANGE 2 - 3  Dietary changes: no    Health status changes: no    Bleeding/hemorrhagic complications: no    Recent/future hospitalizations: no    Any changes in medication regimen? no    Recent/future dental: no  Any missed doses?: no       Is patient compliant with meds? yes       Allergies: 1)  ! Pcn 2)  ! Amoxicillin 3)  ! Sulfa  Anticoagulation Management History:      The patient is taking warfarin and comes in today for a routine follow up visit.  Positive risk factors for bleeding include an age of 75 years or older.  The bleeding index is 'intermediate risk'.  Positive CHADS2 values include History of CHF, History of HTN, and Age > 76 years old.  The start date was 06/03/2002.  Anticoagulation responsible provider: Juanda Chance MD, Smitty Cords.  INR POC: 2.6.  Cuvette Lot#: [id number].  Exp: 12/2010.    Anticoagulation Management Assessment/Plan:      The patient's current anticoagulation dose is Coumadin 5 mg tabs: Take as directed by coumadin clinic..  The target INR is 2.0-3.0.  The next INR is due 12/14/2009.  Anticoagulation instructions were given to patient.  Results were reviewed/authorized by Bethena Midget, RN, BSN.  She was notified by Bethena Midget, RN, BSN.         Prior Anticoagulation Instructions: INR 2.0. Take 0.5 tablet daily except 1 tablet on Tues and Sat.  Current Anticoagulation Instructions: INR 2.6 Continue 2.5mg s daily except 5mg s on Tuesdays and Saturdays. Reheck in 4 weeks.

## 2010-08-22 NOTE — Assessment & Plan Note (Signed)
Summary: f70m  Medications Added ATENOLOL 100 MG TABS (ATENOLOL) Take one tablet by mouth daily CALCIUM 600+D PLUS MINERALS 600-400 MG-UNIT TABS (CALCIUM CARBONATE-VIT D-MIN)  CALCIUM 600+D PLUS MINERALS 600-400 MG-UNIT TABS (CALCIUM CARBONATE-VIT D-MIN) Take 1 tablet by mouth once a day        Visit Type:  6 months follow up Primary Provider:  Butch Penny  CC:  palpitations and chest pains.  History of Present Illness: The patient is 77 years and returned for management of atrial fibrillation. She has chronic atrial fibrillation which is managed with rate control medications and Coumadin. She also has a history of diastolic CHF. She's had nonobstructive CAD a catheterization in the past.  She says she's been doing fairly well although she does say she has some feeling of palpitations and rapid heartbeat from time to time. Her other problems include hypertension and hyperlipidemia.  Current Medications (verified): 1)  Metoprolol Succinate 50 Mg Xr24h-Tab (Metoprolol Succinate) .... Take 1  Tab Daily and An Extra Half At Night If Needed 2)  Diltiazem Hcl 120 Mg Tabs (Diltiazem Hcl) .... One Tab A Day 3)  Furosemide 80 Mg Tabs (Furosemide) .... Take One Tablet By Mouth Daily. 4)  Atenolol 100 Mg Tabs (Atenolol) .... Take One Tablet By Mouth Daily 5)  Enalapril Maleate 10 Mg Tabs (Enalapril Maleate) 6)  Crestor 20 Mg Tabs (Rosuvastatin Calcium) .... Take One Tablet By Mouth Daily. 7)  Niaspan 500 Mg Cr-Tabs (Niacin (Antihyperlipidemic)) .Marland Kitchen.. 1 Tab Daily 8)  Allopurinol 100 Mg Tabs (Allopurinol) .Marland Kitchen.. 1 By Mouth Daily 9)  Calcium 600+d Plus Minerals 600-400 Mg-Unit Tabs (Calcium Carbonate-Vit D-Min) 10)  Coumadin 5 Mg Tabs (Warfarin Sodium) .... Take As Directed By Coumadin Clinic. 11)  Calcium 600+d Plus Minerals 600-400 Mg-Unit Tabs (Calcium Carbonate-Vit D-Min) .... Take 1 Tablet By Mouth Once A Day  Allergies: 1)  ! Pcn 2)  ! Amoxicillin 3)  ! Sulfa  Past History:  Past  Medical History: Reviewed history from 09/20/2009 and no changes required. Current Problems:  HYPERLIPIDEMIA (ICD-272.4) HYPERTENSION (ICD-401.9) CAD (ICD-414.00) VENOUS INSUFFICIENCY, LEGS (ICD-459.81) ACUTE DIASTOLIC HEART FAILURE (ICD-428.31) ATRIAL FIBRILLATION, CHRONIC (ICD-427.31)  Review of Systems       ROS is negative except as outlined in HPI.   Vital Signs:  Patient profile:   77 year old female Height:      63 inches Weight:      149 pounds BMI:     26.49 Pulse rate:   69 / minute Pulse rhythm:   irregular Resp:     18 per minute BP sitting:   130 / 70  (right arm) Cuff size:   large  Vitals Entered By: Vikki Ports (April 10, 2010 10:53 AM)  Physical Exam  Additional Exam:  Gen. Well-nourished, in no distress   Neck: No JVD, thyroid not enlarged, no carotid bruits Lungs: No tachypnea, clear without rales, rhonchi or wheezes Cardiovascular: Rhythm irregular, PMI not displaced,  heart sounds  normal, no murmurs or gallops, trace to 1+  peripheral edema, pulses normal in all 4 extremities. Abdomen: BS normal, abdomen soft and non-tender without masses or organomegaly, no hepatosplenomegaly. MS: No deformities, no cyanosis or clubbing   Neuro:  No focal sns   Skin:  no lesions    Impression & Recommendations:  Problem # 1:  ATRIAL FIBRILLATION, CHRONIC (ICD-427.31)  She has chronic atrial fibrillation managed with rate control and Coumadin. She gets her Coumadin checks here. His wound appears stable. Her updated medication list  for this problem includes:    Metoprolol Succinate 50 Mg Xr24h-tab (Metoprolol succinate) .Marland Kitchen... Take 1  tab daily and an extra half at night if needed    Atenolol 100 Mg Tabs (Atenolol) .Marland Kitchen... Take one tablet by mouth daily    Coumadin 5 Mg Tabs (Warfarin sodium) .Marland Kitchen... Take as directed by coumadin clinic.  Orders: TLB-TSH (Thyroid Stimulating Hormone) (84443-TSH) Echocardiogram (Echo)  Problem # 2:  HYPERTENSION  (ICD-401.9)  Her blood pressure is well controlled on current medications. Her updated medication list for this problem includes:    Metoprolol Succinate 50 Mg Xr24h-tab (Metoprolol succinate) .Marland Kitchen... Take 1  tab daily and an extra half at night if needed    Diltiazem Hcl 120 Mg Tabs (Diltiazem hcl) ..... One tab a day    Furosemide 80 Mg Tabs (Furosemide) .Marland Kitchen... Take one tablet by mouth daily.    Atenolol 100 Mg Tabs (Atenolol) .Marland Kitchen... Take one tablet by mouth daily    Enalapril Maleate 10 Mg Tabs (Enalapril maleate)  Orders: TLB-TSH (Thyroid Stimulating Hormone) (84443-TSH) EKG w/ Interpretation (93000)  Problem # 3:  ACUTE DIASTOLIC HEART FAILURE (ICD-428.31)  She appears euvolemic today. We will continue current therapy.   We will get laboratory studies that have been obtained recently by her primary care physician. Her updated medication list for this problem includes:    Metoprolol Succinate 50 Mg Xr24h-tab (Metoprolol succinate) .Marland Kitchen... Take 1  tab daily and an extra half at night if needed    Diltiazem Hcl 120 Mg Tabs (Diltiazem hcl) ..... One tab a day    Furosemide 80 Mg Tabs (Furosemide) .Marland Kitchen... Take one tablet by mouth daily.    Atenolol 100 Mg Tabs (Atenolol) .Marland Kitchen... Take one tablet by mouth daily    Enalapril Maleate 10 Mg Tabs (Enalapril maleate)    Coumadin 5 Mg Tabs (Warfarin sodium) .Marland Kitchen... Take as directed by coumadin clinic.  Orders: Echocardiogram (Echo)  Patient Instructions: 1)  Your physician recommends that you have lab work today: tsh (428.0;427.31) 2)  Your physician wants you to follow-up in: 6 months with Dr. Clifton James.   You will receive a reminder letter in the mail two months in advance. If you don't receive a letter, please call our office to schedule the follow-up appointment. 3)  Your physician has requested that you have an echocardiogram.  Echocardiography is a painless test that uses sound waves to create images of your heart. It provides your doctor with  information about the size and shape of your heart and how well your heart's chambers and valves are working.  This procedure takes approximately one hour. There are no restrictions for this procedure. 4)  Your physician recommends that you continue on your current medications as directed. Please refer to the Current Medication list given to you today.

## 2010-08-22 NOTE — Medication Information (Signed)
Summary: rov/tm  Anticoagulant Therapy  Managed by: Eda Keys, PharmD Referring MD: Charlies Constable MD PCP: Soyla Murphy Supervising MD: Myrtis Ser MD, Tinnie Gens Indication 1: Atrial Fibrillation (ICD-427.31) Lab Used: [facility] Site: [facility] INR POC 2.7 INR RANGE 2 - 3  Dietary changes: yes       Details: less salads than normal, but pt has continued eating plenty of cabbage, slaw, etc.  Health status changes: no    Bleeding/hemorrhagic complications: no    Recent/future hospitalizations: no    Any changes in medication regimen? no    Recent/future dental: no  Any missed doses?: no       Is patient compliant with meds? yes       Allergies: 1)  ! Pcn 2)  ! Amoxicillin 3)  ! Sulfa  Anticoagulation Management History:      The patient is taking warfarin and comes in today for a routine follow up visit.  Positive risk factors for bleeding include an age of 75 years or older.  The bleeding index is 'intermediate risk'.  Positive CHADS2 values include History of CHF, History of HTN, and Age > 29 years old.  The start date was 06/03/2002.  Anticoagulation responsible provider: Myrtis Ser MD, Tinnie Gens.  INR POC: 2.7.  Cuvette Lot#: [id number].  Exp: 02/2011.    Anticoagulation Management Assessment/Plan:      The patient's current anticoagulation dose is Coumadin 5 mg tabs: Take as directed by coumadin clinic..  The target INR is 2.0-3.0.  The next INR is due 01/11/2010.  Anticoagulation instructions were given to patient.  Results were reviewed/authorized by Eda Keys, PharmD.  She was notified by Eda Keys.         Prior Anticoagulation Instructions: INR 2.6 Continue 2.5mg s daily except 5mg s on Tuesdays and Saturdays. Reheck in 4 weeks.   Current Anticoagulation Instructions: INR 2.7  Continue taking 1 tablet on Tuesday and Saturday and 1/2 tablet all other days.  Return to clinic in 4 weeks.

## 2010-08-22 NOTE — Medication Information (Signed)
Summary: Tracy [name]  Anticoagulant Therapy  Managed by: Lyna Poser, PharmD Referring MD: Charlies Constable MD PCP: Soyla Murphy Supervising MD: Juanda Chance MD,Bruce Indication 1: Atrial Fibrillation (ICD-427.31) Lab Used: [facility] Site: [address] INR POC 2.4 INR RANGE 2 - 3  Dietary changes: no    Health status changes: no    Bleeding/hemorrhagic complications: no    Recent/future hospitalizations: no    Any changes in medication regimen? no    Recent/future dental: no  Any missed doses?: no         Allergies: 1)  ! Pcn 2)  ! Amoxicillin 3)  ! Sulfa  Anticoagulation Management History:      The patient is taking warfarin and comes in today for a routine follow up visit.  Positive risk factors for bleeding include an age of 75 years or older.  The bleeding index is 'intermediate risk'.  Positive CHADS2 values include History of CHF, History of HTN, and Age > 20 years old.  The start date was 06/03/2002.  Anticoagulation responsible provider: Juanda Chance MD,Bruce.  INR POC: 2.4.  Cuvette Lot#: [id number].  Exp: 04/2011.    Anticoagulation Management Assessment/Plan:      The patient's current anticoagulation dose is Coumadin 5 mg tabs: Take as directed by coumadin clinic..  The target INR is 2.0-3.0.  The next INR is due 05/10/2010.  Anticoagulation instructions were given to patient.  Results were reviewed/authorized by Lyna Poser, PharmD.         Prior Anticoagulation Instructions: INR 2.2  Continue taking 1/2 tablet (2.5mg ) every day except take 1 tablet (5mg ) on Tuesdays and Saturday.  Recheck in 4 weeks.   Current Anticoagulation Instructions: INR 2.4 No changes today. Keep taking a half tablet everyday except on tuesday and saturday. On tuesday and saturday take a whole tablet. Recheck in 4 weeks.

## 2010-08-22 NOTE — Medication Information (Signed)
Summary: rov/eac  Anticoagulant Therapy  Managed by: Cloyde Reams, RN, BSN Referring MD: Charlies Constable MD PCP: Soyla Murphy Supervising MD: Riley Kill MD, Maisie Fus Indication 1: Atrial Fibrillation (ICD-427.31) Lab Used: [facility] Site: [facility] INR POC 2.4 INR RANGE 2 - 3  Dietary changes: no    Health status changes: no    Bleeding/hemorrhagic complications: no    Recent/future hospitalizations: no    Any changes in medication regimen? no    Recent/future dental: no  Any missed doses?: no       Is patient compliant with meds? yes       Allergies: 1)  ! Pcn 2)  ! Amoxicillin 3)  ! Sulfa  Anticoagulation Management History:      The patient is taking warfarin and comes in today for a routine follow up visit.  Positive risk factors for bleeding include an age of 75 years or older.  The bleeding index is 'intermediate risk'.  Positive CHADS2 values include History of CHF, History of HTN, and Age > 64 years old.  The start date was 06/03/2002.  Anticoagulation responsible provider: Riley Kill MD, Maisie Fus.  INR POC: 2.4.  Cuvette Lot#: [id number].  Exp: 03/2011.    Anticoagulation Management Assessment/Plan:      The patient's current anticoagulation dose is Coumadin 5 mg tabs: Take as directed by coumadin clinic..  The target INR is 2.0-3.0.  The next INR is due 02/08/2010.  Anticoagulation instructions were given to patient.  Results were reviewed/authorized by Cloyde Reams, RN, BSN.  She was notified by Cloyde Reams RN.         Prior Anticoagulation Instructions: INR 2.7  Continue taking 1 tablet on Tuesday and Saturday and 1/2 tablet all other days.  Return to clinic in 4 weeks.    Current Anticoagulation Instructions: INR 2.4  Continue on same dosage 1/2 tablet daily except 1 tablet on Tuesdays and Saturdays.  Recheck in 4 weeks.

## 2010-08-22 NOTE — Progress Notes (Signed)
Summary: RX SENT IN TODAY  Phone Note Refill Request Message from:  Pharmacy on July 25, 2010 8:35 AM  Refills Requested: Medication #1:  ENALAPRIL MALEATE 10 MG TABS [place] [phone]  Initial call taken by: Judie Grieve,  July 25, 2010 8:36 AM    Prescriptions: ENALAPRIL MALEATE 10 MG TABS (ENALAPRIL MALEATE)   #30 x 11   Entered by:   Danielle Rankin, CMA   Authorized by:   Verne Carrow, MD   Signed by:   Danielle Rankin, CMA on 07/25/2010   Method used:   [facility]ed to ...       [facility]       [address]       [place], [place]  [address]       Ph: [phone] or [phone]       Fax: [phone]   RxID:   [id number]

## 2010-08-22 NOTE — Medication Information (Signed)
Summary: rov/mw  Anticoagulant Therapy  Managed by: Bethena Midget, RN, BSN Referring MD: Charlies Constable MD PCP: Butch Penny Supervising MD: Daleen Squibb MD, Maisie Fus Indication 1: Atrial Fibrillation (ICD-427.31) Lab Used: [facility] Site: [facility] INR POC 2.9 INR RANGE 2 - 3  Dietary changes: no    Health status changes: no    Bleeding/hemorrhagic complications: no    Recent/future hospitalizations: no    Any changes in medication regimen? no    Recent/future dental: no  Any missed doses?: no       Is patient compliant with meds? yes       Allergies: 1)  ! Pcn 2)  ! Amoxicillin 3)  ! Sulfa  Anticoagulation Management History:      The patient is taking warfarin and comes in today for a routine follow up visit.  Positive risk factors for bleeding include an age of 71 years or older.  The bleeding index is 'intermediate risk'.  Positive CHADS2 values include History of CHF, History of HTN, and Age > 39 years old.  The start date was 06/03/2002.  Anticoagulation responsible provider: Daleen Squibb MD, Maisie Fus.  INR POC: 2.9.  Cuvette Lot#: [id number].  Exp: 08/2011.    Anticoagulation Management Assessment/Plan:      The patient's current anticoagulation dose is Coumadin 5 mg tabs: Take as directed by coumadin clinic..  The target INR is 2.0-3.0.  The next INR is due 08/02/2010.  Anticoagulation instructions were given to patient.  Results were reviewed/authorized by Bethena Midget, RN, BSN.  She was notified by Bethena Midget, RN, BSN.         Prior Anticoagulation Instructions: INR 2.2 Continue taking 1 tablet on tuesday and saturday. And a half tablet all other days. Recheck in 4 weeks.  Current Anticoagulation Instructions: INR 2.9 Continue 2.5mg s everyday except 5mg s on Tuesdays and Saturdays. Recheck in 4 weeks.

## 2010-08-22 NOTE — Medication Information (Signed)
Summary: rov.mp  Anticoagulant Therapy  Managed by: Cloyde Reams, RN, BSN Referring MD: Charlies Constable MD Supervising MD: Gala Romney MD, Reuel Boom Indication 1: Atrial Fibrillation (ICD-427.31) Lab Used: [facility] Site: [facility] INR POC 2.3 INR RANGE 2 - 3  Dietary changes: no    Health status changes: no    Bleeding/hemorrhagic complications: no    Recent/future hospitalizations: no    Any changes in medication regimen? no    Recent/future dental: no  Any missed doses?: no       Is patient compliant with meds? yes       Allergies (verified): 1)  ! Pcn 2)  ! Amoxicillin 3)  ! Sulfa  Anticoagulation Management History:      The patient is taking warfarin and comes in today for a routine follow up visit.  Positive risk factors for bleeding include an age of 75 years or older.  The bleeding index is 'intermediate risk'.  Positive CHADS2 values include Age > 47 years old.  The start date was 06/03/2002.  Anticoagulation responsible provider: Bensimhon MD, Reuel Boom.  INR POC: 2.3.  Cuvette Lot#: [id number].  Exp: 10/2010.    Anticoagulation Management Assessment/Plan:      The patient's current anticoagulation dose is Coumadin 5 mg tabs: Take as directed by coumadin clinic..  The target INR is 2.0-3.0.  The next INR is due 09/19/2009.  Anticoagulation instructions were given to patient.  Results were reviewed/authorized by Cloyde Reams, RN, BSN.  She was notified by Cloyde Reams RN.         Prior Anticoagulation Instructions: INR 2.1  Continue taking 0.5 tab daily except 1 tab each Tuesday and Saturday.  Recheck in 4 weeks.    Current Anticoagulation Instructions: INR 2.3  Continue on same dosage 1/2 tablet daily except 1 tablet on Tuesdays and Saturdays.  Recheck in 4 weeks.

## 2010-08-22 NOTE — Medication Information (Signed)
Summary: rov/tm  Anticoagulant Therapy  Managed by: Bethena Midget, RN, BSN Referring MD: Charlies Constable MD PCP: Butch Penny Supervising MD: Antoine Poche MD, Fayrene Fearing Indication 1: Atrial Fibrillation (ICD-427.31) Lab Used: [facility] Site: [facility] INR POC 2.2 INR RANGE 2 - 3  Dietary changes: no    Health status changes: no    Bleeding/hemorrhagic complications: no    Recent/future hospitalizations: no    Any changes in medication regimen? no    Recent/future dental: no  Any missed doses?: no       Is patient compliant with meds? yes       Allergies: 1)  ! Pcn 2)  ! Amoxicillin 3)  ! Sulfa  Anticoagulation Management History:      The patient is taking warfarin and comes in today for a routine follow up visit.  Positive risk factors for bleeding include an age of 75 years or older.  The bleeding index is 'intermediate risk'.  Positive CHADS2 values include History of CHF, History of HTN, and Age > 35 years old.  The start date was 06/03/2002.  Anticoagulation responsible provider: Antoine Poche MD, Fayrene Fearing.  INR POC: 2.2.  Cuvette Lot#: [id number].  Exp: 08/2011.    Anticoagulation Management Assessment/Plan:      The patient's current anticoagulation dose is Coumadin 5 mg tabs: Take as directed by coumadin clinic..  The target INR is 2.0-3.0.  The next INR is due 08/30/2010.  Anticoagulation instructions were given to patient.  Results were reviewed/authorized by Bethena Midget, RN, BSN.  She was notified by Linward Headland, PharmD candidate.         Prior Anticoagulation Instructions: INR 2.9 Continue 2.5mg s everyday except 5mg s on Tuesdays and Saturdays. Recheck in 4 weeks.   Current Anticoagulation Instructions: INR 2.2 (INR goal:  2-3)  Take 1/2 tablet everyday except 1 tablet on Tuesdays and Fridays.

## 2010-08-22 NOTE — Medication Information (Signed)
Summary: rov/ewj  Anticoagulant Therapy  Managed by: Elaina Pattee, PharmD Referring MD: Charlies Constable MD PCP: Soyla Murphy Supervising MD: Tenny Craw MD, Gunnar Fusi Indication 1: Atrial Fibrillation (ICD-427.31) Lab Used: [facility] Site: [facility] INR POC 2.0 INR RANGE 2 - 3  Dietary changes: no    Health status changes: no    Bleeding/hemorrhagic complications: no    Recent/future hospitalizations: no    Any changes in medication regimen? no    Recent/future dental: no  Any missed doses?: no       Is patient compliant with meds? yes       Allergies: 1)  ! Pcn 2)  ! Amoxicillin 3)  ! Sulfa  Anticoagulation Management History:      The patient is taking warfarin and comes in today for a routine follow up visit.  Positive risk factors for bleeding include an age of 17 years or older.  The bleeding index is 'intermediate risk'.  Positive CHADS2 values include History of CHF, History of HTN, and Age > 58 years old.  The start date was 06/03/2002.  Anticoagulation responsible provider: Tenny Craw MD, Gunnar Fusi.  INR POC: 2.0.  Cuvette Lot#: [id number].  Exp: 11/2010.    Anticoagulation Management Assessment/Plan:      The patient's current anticoagulation dose is Coumadin 5 mg tabs: Take as directed by coumadin clinic..  The target INR is 2.0-3.0.  The next INR is due 11/16/2009.  Anticoagulation instructions were given to patient.  Results were reviewed/authorized by Elaina Pattee, PharmD.  She was notified by Elaina Pattee, PharmD.         Prior Anticoagulation Instructions: INR 2.9  Continue on same dosage 1/2 tablet daily except 1 tablet on Tuesdays and Saturdays.  Recheck in 4 weeks.    Current Anticoagulation Instructions: INR 2.0. Take 0.5 tablet daily except 1 tablet on Tues and Sat.

## 2010-08-22 NOTE — Medication Information (Signed)
Summary: ccr/saf  Anticoagulant Therapy  Managed by: Weston Brass, PharmD Referring MD: Charlies Constable MD PCP: Soyla Murphy Supervising MD: Riley Kill MD, Maisie Fus Indication 1: Atrial Fibrillation (ICD-427.31) Lab Used: [facility] Site: [facility] INR POC 2.2 INR RANGE 2 - 3  Dietary changes: no    Health status changes: yes       Details: Pt fell out of her door and landed on her driveway.  She did not hit her head.  Both ankles were badly bruised, sore, and had some swelling.  XRays showed no breaks or cracks.  She is able to walk but says they are sore and ache.  Bleeding/hemorrhagic complications: no    Recent/future hospitalizations: no    Any changes in medication regimen? yes       Details: Pt takes a Tylenol every few days to help with her ankle pain.   Recent/future dental: no  Any missed doses?: no       Is patient compliant with meds? yes       Allergies: 1)  ! Pcn 2)  ! Amoxicillin 3)  ! Sulfa  Anticoagulation Management History:      The patient is taking warfarin and comes in today for a routine follow up visit.  Positive risk factors for bleeding include an age of 75 years or older.  The bleeding index is 'intermediate risk'.  Positive CHADS2 values include History of CHF, History of HTN, and Age > 42 years old.  The start date was 06/03/2002.  Anticoagulation responsible Niya Behler: Riley Kill MD, Maisie Fus.  INR POC: 2.2.  Cuvette Lot#: [id number].  Exp: 04/2011.    Anticoagulation Management Assessment/Plan:      The patient's current anticoagulation dose is Coumadin 5 mg tabs: Take as directed by coumadin clinic..  The target INR is 2.0-3.0.  The next INR is due 04/05/2010.  Anticoagulation instructions were given to patient.  Results were reviewed/authorized by Weston Brass, PharmD.  She was notified by Gweneth Fritter, PharmD Candidate.         Prior Anticoagulation Instructions: INR 2.2 Continue 2.5mg s everyday except 5mg s on Tuesdays and Saturdays. Recheck in 4 weeks.    Current Anticoagulation Instructions: INR 2.2  Continue taking 1/2 tablet (2.5mg ) every day except take 1 tablet (5mg ) on Tuesdays and Saturday.  Recheck in 4 weeks.

## 2010-08-22 NOTE — Medication Information (Signed)
Summary: rov/ewj  Anticoagulant Therapy  Managed by: Bethena Midget, RN, BSN Referring MD: Charlies Constable MD PCP: Soyla Murphy Supervising MD: Daleen Squibb MD, Maisie Fus Indication 1: Atrial Fibrillation (ICD-427.31) Lab Used: [facility] Site: [facility] INR POC 2.2 INR RANGE 2 - 3  Dietary changes: no    Health status changes: no    Bleeding/hemorrhagic complications: no    Recent/future hospitalizations: no    Any changes in medication regimen? no    Recent/future dental: no  Any missed doses?: no       Is patient compliant with meds? yes       Allergies: 1)  ! Pcn 2)  ! Amoxicillin 3)  ! Sulfa  Anticoagulation Management History:      The patient is taking warfarin and comes in today for a routine follow up visit.  Positive risk factors for bleeding include an age of 75 years or older.  The bleeding index is 'intermediate risk'.  Positive CHADS2 values include History of CHF, History of HTN, and Age > 60 years old.  The start date was 06/03/2002.  Anticoagulation responsible provider: Daleen Squibb MD, Maisie Fus.  INR POC: 2.2.  Cuvette Lot#: [id number].  Exp: 04/2011.    Anticoagulation Management Assessment/Plan:      The patient's current anticoagulation dose is Coumadin 5 mg tabs: Take as directed by coumadin clinic..  The target INR is 2.0-3.0.  The next INR is due 03/08/2010.  Anticoagulation instructions were given to patient.  Results were reviewed/authorized by Bethena Midget, RN, BSN.  She was notified by Bethena Midget, RN, BSN.         Prior Anticoagulation Instructions: INR 2.4  Continue on same dosage 1/2 tablet daily except 1 tablet on Tuesdays and Saturdays.  Recheck in 4 weeks.    Current Anticoagulation Instructions: INR 2.2 Continue 2.5mg s everyday except 5mg s on Tuesdays and Saturdays. Recheck in 4 weeks.

## 2010-08-22 NOTE — Medication Information (Signed)
Summary: rov/mw  Anticoagulant Therapy  Managed by: Cloyde Reams, RN, BSN Referring MD: Charlies Constable MD PCP: Butch Penny Supervising MD: Juanda Chance MD,Bruce Indication 1: Atrial Fibrillation (ICD-427.31) Lab Used: [facility] Site: [address] INR POC 2.4 INR RANGE 2 - 3  Dietary changes: no    Health status changes: no    Bleeding/hemorrhagic complications: no    Recent/future hospitalizations: no    Any changes in medication regimen? no    Recent/future dental: no  Any missed doses?: no       Is patient compliant with meds? yes       Allergies: 1)  ! Pcn 2)  ! Amoxicillin 3)  ! Sulfa  Anticoagulation Management History:      The patient is taking warfarin and comes in today for a routine follow up visit.  Positive risk factors for bleeding include an age of 11 years or older.  The bleeding index is 'intermediate risk'.  Positive CHADS2 values include History of CHF, History of HTN, and Age > 68 years old.  The start date was 06/03/2002.  Anticoagulation responsible Mayerly Kaman: Juanda Chance MD,Bruce.  INR POC: 2.4.  Cuvette Lot#: [id number].  Exp: 06/2011.    Anticoagulation Management Assessment/Plan:      The patient's current anticoagulation dose is Coumadin 5 mg tabs: Take as directed by coumadin clinic..  The target INR is 2.0-3.0.  The next INR is due 06/07/2010.  Anticoagulation instructions were given to patient.  Results were reviewed/authorized by Cloyde Reams, RN, BSN.  She was notified by Cloyde Reams RN.         Prior Anticoagulation Instructions: INR 2.4 No changes today. Keep taking a half tablet everyday except on tuesday and saturday. On tuesday and saturday take a whole tablet. Recheck in 4 weeks.   Current Anticoagulation Instructions: INR 2.4  Continue on same dosage 1/2 tablet daily except 1 tablet on Tuesdays and Saturdays.  Recheck in 4 weeks.

## 2010-08-22 NOTE — Medication Information (Signed)
Summary: coumadin f/u sl  Anticoagulant Therapy  Managed by: Cloyde Reams, RN, BSN Referring MD: Charlies Constable MD Supervising MD: Gala Romney MD, Reuel Boom Indication 1: Atrial Fibrillation (ICD-427.31) Lab Used: [facility] Site: [facility] INR POC 2.9 INR RANGE 2 - 3  Dietary changes: no    Health status changes: no    Bleeding/hemorrhagic complications: no    Recent/future hospitalizations: no    Any changes in medication regimen? no    Recent/future dental: no  Any missed doses?: no       Is patient compliant with meds? yes       Allergies (verified): 1)  ! Pcn 2)  ! Amoxicillin 3)  ! Sulfa  Anticoagulation Management History:      The patient is taking warfarin and comes in today for a routine follow up visit.  Positive risk factors for bleeding include an age of 31 years or older.  The bleeding index is 'intermediate risk'.  Positive CHADS2 values include History of CHF, History of HTN, and Age > 86 years old.  The start date was 06/03/2002.  Anticoagulation responsible provider: Bensimhon MD, Reuel Boom.  INR POC: 2.9.  Cuvette Lot#: [id number].  Exp: 10/2010.    Anticoagulation Management Assessment/Plan:      The patient's current anticoagulation dose is Coumadin 5 mg tabs: Take as directed by coumadin clinic..  The target INR is 2.0-3.0.  The next INR is due 10/19/2009.  Anticoagulation instructions were given to patient.  Results were reviewed/authorized by Cloyde Reams, RN, BSN.  She was notified by Cloyde Reams RN.         Prior Anticoagulation Instructions: INR 2.3  Continue on same dosage 1/2 tablet daily except 1 tablet on Tuesdays and Saturdays.  Recheck in 4 weeks.    Current Anticoagulation Instructions: INR 2.9  Continue on same dosage 1/2 tablet daily except 1 tablet on Tuesdays and Saturdays.  Recheck in 4 weeks.

## 2010-08-22 NOTE — Miscellaneous (Signed)
  Clinical Lists Changes 

## 2010-08-22 NOTE — Medication Information (Signed)
Summary: rov/ewj  Anticoagulant Therapy  Managed by: Lyna Poser, PharmD Referring MD: Charlies Constable MD PCP: Butch Penny Supervising MD: Antoine Poche MD, Fayrene Fearing Indication 1: Atrial Fibrillation (ICD-427.31) Lab [facility]  Site: [facility] INR POC 2.2 INR RANGE 2 - 3  Dietary changes: no    Health status changes: no    Bleeding/hemorrhagic complications: no    Recent/future hospitalizations: no    Any changes in medication regimen? no    Recent/future dental: no  Any missed doses?: no       Is patient compliant with meds? yes       Allergies: 1)  ! Pcn 2)  ! Amoxicillin 3)  ! Sulfa  Anticoagulation Management History:      The patient is taking warfarin and comes in today for a routine follow up visit.  Positive risk factors for bleeding include an age of 50 years or older.  The bleeding index is 'intermediate risk'.  Positive CHADS2 values include History of CHF, History of HTN, and Age > 87 years old.  The start date was 06/03/2002.  Anticoagulation responsible provider: Antoine Poche MD, Fayrene Fearing.  INR POC: 2.2.  Cuvette Lot#: [id number].  Exp: 06/2011.    Anticoagulation Management Assessment/Plan:      The patient's current anticoagulation dose is Coumadin 5 mg tabs: Take as directed by coumadin clinic..  The target INR is 2.0-3.0.  The next INR is due 07/05/2010.  Anticoagulation instructions were given to patient.  Results were reviewed/authorized by Lyna Poser, PharmD.         Prior Anticoagulation Instructions: INR 2.4  Continue on same dosage 1/2 tablet daily except 1 tablet on Tuesdays and Saturdays.  Recheck in 4 weeks.    Current Anticoagulation Instructions: INR 2.2 Continue taking 1 tablet on tuesday and saturday. And a half tablet all other days. Recheck in 4 weeks.

## 2010-08-22 NOTE — Assessment & Plan Note (Signed)
Summary: 6 month rov  Medications Added ASPIRIN EC 325 MG TBEC (ASPIRIN) Take one tablet by mouth daily      Allergies Added:   Visit Type:  Follow-up Primary Provider:  [facility]  CC:  Chest pain / palpatations.  History of Present Illness: The patient is 77 years old and return for management of atrial fibrillation and diastolic heart failure. She has chronic atrial fibrillation. His nonobstructive coronary disease and she has had diastolic heart failure. She's been doing fairly well but says he has some symptoms of palpitations and some edema.  Her other problems include hypertension hyperlipidemia and GERD.  She does indicate that she is under a great deal of stress at home.  Current Medications (verified): 1)  Metoprolol Succinate 50 Mg Xr24h-Tab (Metoprolol Succinate) .... Take 1  Tab Daily and An Extra Half At Night If Needed 2)  Diltiazem Hcl 120 Mg Tabs (Diltiazem Hcl) .... One Tab A Day 3)  Furosemide 80 Mg Tabs (Furosemide) .... Take One Tablet By Mouth Daily. 4)  Aspirin Ec 325 Mg Tbec (Aspirin) .... Take One Tablet By Mouth Daily 5)  Enalapril Maleate 10 Mg Tabs (Enalapril Maleate) 6)  Crestor 20 Mg Tabs (Rosuvastatin Calcium) .... Take One Tablet By Mouth Daily. 7)  Niaspan 500 Mg Cr-Tabs (Niacin (Antihyperlipidemic)) .Marland Kitchen.. 1 Tab Daily 8)  Allopurinol 100 Mg Tabs (Allopurinol) .Marland Kitchen.. 1 By Mouth Daily 9)  Calcium With Vitamin D 600mg  .... One By Mouth Daily 10)  Coumadin 5 Mg Tabs (Warfarin Sodium) .... Take As Directed By Coumadin Clinic.  Allergies (verified): 1)  ! Pcn 2)  ! Amoxicillin 3)  ! Sulfa  Past History:  Past Medical History: Reviewed history from 09/20/2009 and no changes required. Current Problems:  HYPERLIPIDEMIA (ICD-272.4) HYPERTENSION (ICD-401.9) CAD (ICD-414.00) VENOUS INSUFFICIENCY, LEGS (ICD-459.81) ACUTE DIASTOLIC HEART FAILURE (ICD-428.31) ATRIAL FIBRILLATION, CHRONIC (ICD-427.31)  Review of Systems       ROS is negative except as  outlined in HPI.   Vital Signs:  Patient profile:   77 year old female Height:      63 inches Weight:      153 pounds BMI:     27.20 Pulse rate:   54 / minute BP sitting:   124 / 52  (left arm) Cuff size:   regular  Vitals Entered By: Hardin Negus, RMA (September 21, 2009 4:11 PM)  Physical Exam  Additional Exam:  Gen. Well-nourished, in no distress   Neck: JVD 1-2 cm above the clavicle, thyroid not enlarged, no carotid bruits Lungs: No tachypnea, clear without rales, rhonchi or wheezes Cardiovascular: Rhythm irregular, PMI not displaced,  heart sounds  normal, no murmurs or gallops, trace to 1+ bilateral peripheral edema, pulses normal in all 4 extremities. Abdomen: BS normal, abdomen soft and non-tender without masses or organomegaly, no hepatosplenomegaly. MS: No deformities, no cyanosis or clubbing   Neuro:  No focal sns   Skin:  no lesions    Impression & Recommendations:  Problem # 1:  ATRIAL FIBRILLATION, CHRONIC (ICD-427.31) Her atrial fibrillation is chronic. Her rate control appears good and we will continue her current medications plus Coumadin. Her updated medication list for this problem includes:    Metoprolol Succinate 50 Mg Xr24h-tab (Metoprolol succinate) .Marland Kitchen... Take 1  tab daily and an extra half at night if needed    Aspirin Ec 325 Mg Tbec (Aspirin) .Marland Kitchen... Take one tablet by mouth daily    Coumadin 5 Mg Tabs (Warfarin sodium) .Marland Kitchen... Take as directed by coumadin  clinic.  Orders: EKG w/ Interpretation ([id number])  Problem # 2:  ACUTE DIASTOLIC HEART FAILURE (ICD-428.31) She has diastolic heart failure. Her jugular venous pulses just at the clavicle and she has trace edema. I think she is fairly well compensated and we will leave her medications the same. Her updated medication list for this problem includes:    Metoprolol Succinate 50 Mg Xr24h-tab (Metoprolol succinate) .Marland Kitchen... Take 1  tab daily and an extra half at night if needed    Diltiazem Hcl 120 Mg Tabs  (Diltiazem hcl) ..... One tab a day    Furosemide 80 Mg Tabs (Furosemide) .Marland Kitchen... Take one tablet by mouth daily.    Aspirin Ec 325 Mg Tbec (Aspirin) .Marland Kitchen... Take one tablet by mouth daily    Enalapril Maleate 10 Mg Tabs (Enalapril maleate)    Coumadin 5 Mg Tabs (Warfarin sodium) .Marland Kitchen... Take as directed by coumadin clinic.  Problem # 3:  HYPERTENSION (ICD-401.9) This appears well controlled on current medications. Her updated medication list for this problem includes:    Metoprolol Succinate 50 Mg Xr24h-tab (Metoprolol succinate) .Marland Kitchen... Take 1  tab daily and an extra half at night if needed    Diltiazem Hcl 120 Mg Tabs (Diltiazem hcl) ..... One tab a day    Furosemide 80 Mg Tabs (Furosemide) .Marland Kitchen... Take one tablet by mouth daily.    Aspirin Ec 325 Mg Tbec (Aspirin) .Marland Kitchen... Take one tablet by mouth daily    Enalapril Maleate 10 Mg Tabs (Enalapril maleate)  Patient Instructions: 1)  Your physician wants you to follow-up in: 6 months.  You will receive a reminder letter in the mail two months in advance. If you don't receive a letter, please call our office to schedule the follow-up appointment. 2)  Your physician recommends that you return for FASTING lab work at your next coumadin check: lipid/liver/cbc/bmet (427.31;428.33;402.10)

## 2010-08-28 NOTE — Consult Note (Signed)
Summary: Card Office Note  Card Office Note   Imported By: Marylou Mccoy 08/22/2010 15:39:06  _____________________________________________________________________  External Attachment:    Type:   Image     Comment:   External Document

## 2010-08-30 ENCOUNTER — Encounter (INDEPENDENT_AMBULATORY_CARE_PROVIDER_SITE_OTHER): Payer: Medicare Other

## 2010-08-30 ENCOUNTER — Encounter: Payer: Self-pay | Admitting: Cardiology

## 2010-08-30 DIAGNOSIS — Z7901 Long term (current) use of anticoagulants: Secondary | ICD-10-CM

## 2010-08-30 DIAGNOSIS — I4891 Unspecified atrial fibrillation: Secondary | ICD-10-CM

## 2010-08-30 LAB — CONVERTED CEMR LAB: POC INR: 2.3

## 2010-09-05 NOTE — Medication Information (Signed)
Summary: Coumadin Clinic  Anticoagulant Therapy  Managed by: Georgina Pillion, PharmD Referring MD: Charlies Constable MD PCP: Butch Penny Supervising MD: Daleen Squibb MD, Maisie Fus Indication 1: Atrial Fibrillation (ICD-427.31) Lab Used: [facility] Site: [facility] INR POC 2.3 INR RANGE 2 - 3  Dietary changes: no    Health status changes: no    Bleeding/hemorrhagic complications: no    Recent/future hospitalizations: no    Any changes in medication regimen? no    Recent/future dental: no  Any missed doses?: no       Is patient compliant with meds? yes       Allergies: 1)  ! Pcn 2)  ! Amoxicillin 3)  ! Sulfa  Anticoagulation Management History:      Positive risk factors for bleeding include an age of 75 years or older.  The bleeding index is 'intermediate risk'.  Positive CHADS2 values include History of CHF, History of HTN, and Age > 67 years old.  The start date was 06/03/2002.  Anticoagulation responsible provider: Daleen Squibb MD, Maisie Fus.  INR POC: 2.3.  Cuvette Lot#: [id number].  Exp: 07/2011.    Anticoagulation Management Assessment/Plan:      The patient's current anticoagulation dose is Coumadin 5 mg tabs: Take as directed by coumadin clinic..  The target INR is 2.0-3.0.  The next INR is due 09/27/2010.  Anticoagulation instructions were given to patient.  Results were reviewed/authorized by Georgina Pillion, PharmD.  She was notified by Georgina Pillion PharmD.         Prior Anticoagulation Instructions: INR 2.2 (INR goal:  2-3)  Take 1/2 tablet everyday except 1 tablet on Tuesdays and Fridays.    Current Anticoagulation Instructions: Continue current regimen of 1/2 tablet (2.5 mg) daily except for 1 tablet (5 mg) on Tuesdays and Saturdays  INR 2.3

## 2010-09-17 ENCOUNTER — Encounter: Payer: Self-pay | Admitting: Cardiovascular Disease

## 2010-09-17 DIAGNOSIS — I4891 Unspecified atrial fibrillation: Secondary | ICD-10-CM

## 2010-10-04 ENCOUNTER — Ambulatory Visit (INDEPENDENT_AMBULATORY_CARE_PROVIDER_SITE_OTHER): Payer: Medicare Other | Admitting: Cardiovascular Disease

## 2010-10-04 ENCOUNTER — Encounter (INDEPENDENT_AMBULATORY_CARE_PROVIDER_SITE_OTHER): Payer: Medicare Other

## 2010-10-04 ENCOUNTER — Encounter: Payer: Self-pay | Admitting: Cardiology

## 2010-10-04 ENCOUNTER — Encounter: Payer: Self-pay | Admitting: Cardiovascular Disease

## 2010-10-04 DIAGNOSIS — I4891 Unspecified atrial fibrillation: Secondary | ICD-10-CM

## 2010-10-04 DIAGNOSIS — E782 Mixed hyperlipidemia: Secondary | ICD-10-CM | POA: Insufficient documentation

## 2010-10-04 DIAGNOSIS — I2510 Atherosclerotic heart disease of native coronary artery without angina pectoris: Secondary | ICD-10-CM

## 2010-10-04 DIAGNOSIS — I10 Essential (primary) hypertension: Secondary | ICD-10-CM

## 2010-10-04 DIAGNOSIS — I080 Rheumatic disorders of both mitral and aortic valves: Secondary | ICD-10-CM

## 2010-10-04 DIAGNOSIS — Z7901 Long term (current) use of anticoagulants: Secondary | ICD-10-CM

## 2010-10-04 LAB — CONVERTED CEMR LAB: POC INR: 2.4

## 2010-10-08 NOTE — Medication Information (Signed)
Summary: Coumadin Clinic  Anticoagulant Therapy  Managed by: Cloyde Reams, RN, BSN Referring MD: Charlies Constable MD PCP: Butch Penny Supervising MD: Daleen Squibb MD, Maisie Fus Indication 1: Atrial Fibrillation (ICD-427.31) Lab Used: [facility] Site: [facility] INR POC 2.4 INR RANGE 2 - 3  Dietary changes: no    Health status changes: no    Bleeding/hemorrhagic complications: no    Recent/future hospitalizations: no    Any changes in medication regimen? no    Recent/future dental: no  Any missed doses?: no       Is patient compliant with meds? yes       Allergies: 1)  ! Pcn 2)  ! Amoxicillin 3)  ! Sulfa  Anticoagulation Management History:      The patient is taking warfarin and comes in today for a routine follow up visit.  Positive risk factors for bleeding include an age of 54 years or older.  The bleeding index is 'intermediate risk'.  Positive CHADS2 values include History of CHF, History of HTN, and Age > 3 years old.  The start date was 06/03/2002.  Anticoagulation responsible provider: Daleen Squibb MD, Maisie Fus.  INR POC: 2.4.  Cuvette Lot#: [id number].  Exp: 09/2011.    Anticoagulation Management Assessment/Plan:      The patient's current anticoagulation dose is Coumadin 5 mg tabs: Take as directed by coumadin clinic..  The target INR is 2.0-3.0.  The next INR is due 11/01/2010.  Anticoagulation instructions were given to patient.  Results were reviewed/authorized by Cloyde Reams, RN, BSN.  She was notified by Cloyde Reams RN.         Prior Anticoagulation Instructions: Continue current regimen of 1/2 tablet (2.5 mg) daily except for 1 tablet (5 mg) on Tuesdays and Saturdays  INR 2.3  Current Anticoagulation Instructions: INR 2.4  Continue on same dosage 1/2 tablet daily except 1 tablet on Tuesdays and Saturdays.  Recheck in 4 weeks.

## 2010-10-08 NOTE — Assessment & Plan Note (Signed)
Summary: f66m per check out 04/10/10 former pt of brodie   Visit Type:  Follow-up Primary Provider:  Butch Penny  CC:  dizziness.  History of Present Illness: 77 yo WF with history of CAD, chronic atrial fibrillation managed with rate control medications and Coumadin, diastolic CHF, HTN and hyperlipidemia here today for planned cardiac follow up. She's had non-obstructive CAD by catheterization in the past.  She is here today for follow up. She has been doing well. She has had no chest pain or SOB. She tells me that she has "dizzy spells" but no syncope. These occur several times per week with standing. She is on chronic coumadin therapy and was in the coumadin clinic today. She has some LE edema, better on Lasix. Echo October 2011 with normal LV function, moderate AI, MR, TR.     Problems Prior to Update: 1)  Hyperlipidemia  (ICD-272.4) 2)  Hypertension  (ICD-401.9) 3)  Cad  (ICD-414.00) 4)  Venous Insufficiency, Legs  (ICD-459.81) 5)  Acute Diastolic Heart Failure  (ICD-428.31) 6)  Atrial Fibrillation, Chronic  (ICD-427.31)  Current Medications (verified): 1)  Metoprolol Tartrate 50 Mg Tabs (Metoprolol Tartrate) .... Take One Tablet By Mouth in The Morning and 1/2 Tablet in The Evening If Needed 2)  Diltiazem Hcl 120 Mg Tabs (Diltiazem Hcl) .... One Tab A Day 3)  Furosemide 80 Mg Tabs (Furosemide) .... Take One Tablet By Mouth Daily. 4)  Enalapril Maleate 10 Mg Tabs (Enalapril Maleate) 5)  Crestor 20 Mg Tabs (Rosuvastatin Calcium) .... Take One Tablet By Mouth Daily. 6)  Niaspan 500 Mg Cr-Tabs (Niacin (Antihyperlipidemic)) .Marland Kitchen.. 1 Tab Daily 7)  Allopurinol 100 Mg Tabs (Allopurinol) .Marland Kitchen.. 1 By Mouth Daily 8)  Coumadin 5 Mg Tabs (Warfarin Sodium) .... Take As Directed By Coumadin Clinic. 9)  Calcium 600+d Plus Minerals 600-400 Mg-Unit Tabs (Calcium Carbonate-Vit D-Min) .... Take 1 Tablet By Mouth Once A Day 10)  Aspirin 81 Mg Tbec (Aspirin) .... Take One Tablet By Mouth  Daily  Allergies: 1)  ! Pcn 2)  ! Amoxicillin 3)  ! Sulfa  Past History:  Past Medical History: Reviewed history from 09/20/2009 and no changes required. Current Problems:  HYPERLIPIDEMIA (ICD-272.4) HYPERTENSION (ICD-401.9) CAD (ICD-414.00) VENOUS INSUFFICIENCY, LEGS (ICD-459.81) ACUTE DIASTOLIC HEART FAILURE (ICD-428.31) ATRIAL FIBRILLATION, CHRONIC (ICD-427.31)  Past Surgical History: Back surgery x 2 Hernia repair  Family History: Mother-deceased, natural causes Father killed at age 28.  Social History: The patient does not smoke, she does not drink any alcohol. Married, 2 children 1 grandchild Worked in Personnel officer, retired.  Review of Systems       The patient complains of dizziness.  The patient denies fatigue, malaise, fever, weight gain/loss, vision loss, decreased hearing, hoarseness, chest pain, palpitations, shortness of breath, prolonged cough, wheezing, sleep apnea, coughing up blood, abdominal pain, blood in stool, nausea, vomiting, diarrhea, heartburn, incontinence, blood in urine, muscle weakness, joint pain, leg swelling, rash, skin lesions, headache, fainting, depression, anxiety, enlarged lymph nodes, easy bruising or bleeding, and environmental allergies.    Vital Signs:  Patient profile:   77 year old female Height:      63 inches Weight:      152 pounds Pulse rate:   62 / minute Resp:     14 per minute BP sitting:   110 / 52  (left arm)  Vitals Entered By: Ellender Hose RN (October 04, 2010 11:28 AM)  Physical Exam  General:  General: Well developed, well nourished, NAD HEENT: OP clear,  mucus membranes moist SKIN: warm, dry Neuro: No focal deficits Musculoskeletal: Muscle strength 5/5 all ext Psychiatric: Mood and affect normal Neck: No JVD, no carotid bruits, no thyromegaly, no lymphadenopathy. Lungs:Clear bilaterally, no wheezes, rhonci, crackles CV: Irregular. Soft systolic murmur. No  gallops rubs Abdomen: soft, NT, ND, BS  present Extremities: Trace lower ext  edema, pulses 1-2+.    EKG  Procedure date:  10/04/2010  Findings:      Atrial fibrillation, rate 69 bpm.   Impression & Recommendations:  Problem # 1:  ATRIAL FIBRILLATION, CHRONIC (ICD-427.31) Rate well controlled. She is on chronic anticoagulation with coumadin. No changes today.   The following medications were removed from the medication list:    Atenolol 100 Mg Tabs (Atenolol) .Marland Kitchen... Take one tablet by mouth daily Her updated medication list for this problem includes:    Metoprolol Tartrate 50 Mg Tabs (Metoprolol tartrate) .Marland Kitchen... Take one tablet by mouth in the morning and 1/2 tablet in the evening if needed    Coumadin 5 Mg Tabs (Warfarin sodium) .Marland Kitchen... Take as directed by coumadin clinic.    Aspirin 81 Mg Tbec (Aspirin) .Marland Kitchen... Take one tablet by mouth daily  Problem # 2:  CAD (ICD-414.00) Stable. No changes.   The following medications were removed from the medication list:    Atenolol 100 Mg Tabs (Atenolol) .Marland Kitchen... Take one tablet by mouth daily Her updated medication list for this problem includes:    Metoprolol Tartrate 50 Mg Tabs (Metoprolol tartrate) .Marland Kitchen... Take one tablet by mouth in the morning and 1/2 tablet in the evening if needed    Diltiazem Hcl 120 Mg Tabs (Diltiazem hcl) ..... One tab a day    Enalapril Maleate 10 Mg Tabs (Enalapril maleate)    Coumadin 5 Mg Tabs (Warfarin sodium) .Marland Kitchen... Take as directed by coumadin clinic.    Aspirin 81 Mg Tbec (Aspirin) .Marland Kitchen... Take one tablet by mouth daily  Problem # 3:  HYPERTENSION (ICD-401.9) BP well controlled. No changes.   The following medications were removed from the medication list:    Atenolol 100 Mg Tabs (Atenolol) .Marland Kitchen... Take one tablet by mouth daily Her updated medication list for this problem includes:    Metoprolol Tartrate 50 Mg Tabs (Metoprolol tartrate) .Marland Kitchen... Take one tablet by mouth in the morning and 1/2 tablet in the evening if needed    Diltiazem Hcl 120 Mg Tabs  (Diltiazem hcl) ..... One tab a day    Furosemide 80 Mg Tabs (Furosemide) .Marland Kitchen... Take one tablet by mouth daily.    Enalapril Maleate 10 Mg Tabs (Enalapril maleate)    Aspirin 81 Mg Tbec (Aspirin) .Marland Kitchen... Take one tablet by mouth daily  Problem # 4:  MITRAL REGURG W/ AORTIC INSUFF, RHEUM/NON-RHEUM (ICD-396.3) Moderate MR/AI/TR by echo 10/11. Will need repeat echo one year.   Patient Instructions: 1)  Your physician recommends that you schedule a follow-up appointment in: 6 months.

## 2010-10-14 ENCOUNTER — Other Ambulatory Visit (HOSPITAL_COMMUNITY): Payer: Self-pay | Admitting: Family Medicine

## 2010-10-14 ENCOUNTER — Ambulatory Visit (HOSPITAL_COMMUNITY)
Admission: RE | Admit: 2010-10-14 | Discharge: 2010-10-14 | Disposition: A | Discharge status: Home / Self Care | Payer: Medicare Other | Source: Ambulatory Visit | Attending: Family Medicine | Admitting: Family Medicine

## 2010-10-14 DIAGNOSIS — M79609 Pain in unspecified limb: Secondary | ICD-10-CM | POA: Insufficient documentation

## 2010-10-14 DIAGNOSIS — M545 Low back pain, unspecified: Secondary | ICD-10-CM | POA: Insufficient documentation

## 2010-10-14 DIAGNOSIS — M5137 Other intervertebral disc degeneration, lumbosacral region: Secondary | ICD-10-CM | POA: Insufficient documentation

## 2010-10-14 DIAGNOSIS — M79604 Pain in right leg: Secondary | ICD-10-CM

## 2010-10-14 DIAGNOSIS — M51379 Other intervertebral disc degeneration, lumbosacral region without mention of lumbar back pain or lower extremity pain: Secondary | ICD-10-CM | POA: Insufficient documentation

## 2010-10-23 ENCOUNTER — Other Ambulatory Visit: Payer: Self-pay | Admitting: Pharmacist

## 2010-10-23 ENCOUNTER — Telehealth: Payer: Self-pay | Admitting: Cardiology

## 2010-10-23 MED ORDER — WARFARIN SODIUM 5 MG PO TABS: ORAL_TABLET | ORAL | Status: DC

## 2010-10-23 NOTE — Telephone Encounter (Signed)
Pt needs refill

## 2010-11-01 ENCOUNTER — Other Ambulatory Visit: Payer: Self-pay | Admitting: *Deleted

## 2010-11-01 ENCOUNTER — Ambulatory Visit (INDEPENDENT_AMBULATORY_CARE_PROVIDER_SITE_OTHER): Payer: Medicare Other | Admitting: *Deleted

## 2010-11-01 DIAGNOSIS — Z7901 Long term (current) use of anticoagulants: Secondary | ICD-10-CM | POA: Insufficient documentation

## 2010-11-01 DIAGNOSIS — I4891 Unspecified atrial fibrillation: Secondary | ICD-10-CM

## 2010-11-01 MED ORDER — DILTIAZEM HCL ER COATED BEADS 120 MG PO CP24: 120.0000 mg | ORAL_CAPSULE | Freq: Every day | ORAL | Status: DC

## 2010-11-13 ENCOUNTER — Telehealth: Payer: Self-pay | Admitting: Cardiovascular Disease

## 2010-11-13 NOTE — Telephone Encounter (Signed)
FAXED PRES X 2 FOR GENERIC LASIX 80 MG NO RESPONSE.

## 2010-11-14 MED ORDER — FUROSEMIDE 80 MG PO TABS: 80.0000 mg | ORAL_TABLET | Freq: Every day | ORAL | Status: DC

## 2010-11-19 ENCOUNTER — Ambulatory Visit (INDEPENDENT_AMBULATORY_CARE_PROVIDER_SITE_OTHER): Payer: Medicare Other | Admitting: *Deleted

## 2010-11-19 DIAGNOSIS — I4891 Unspecified atrial fibrillation: Secondary | ICD-10-CM

## 2010-11-22 ENCOUNTER — Encounter: Payer: Medicare Other | Admitting: *Deleted

## 2010-12-03 NOTE — Assessment & Plan Note (Signed)
[name]                            CARDIOLOGY OFFICE NOTE   Tracy [name], Tracy [name]                    MRN:          [id number]  DATE:09/21/2008                            DOB:          [birth year]    PRIMARY CARE PHYSICIAN:  Angus G. McInnis, MD   CLINICAL HISTORY:  Tracy [name] is 77 years old and returned for  followup management of atrial fibrillation and diastolic heart failure.  She goes by Tracy [name].  She had been doing well.  She does have occasional  palpitations.  She did not have much swelling.  He has had some slight  dizziness.   Her past medical history is significant for hypertension,  hyperlipidemia, and GERD.  She has had nonobstructive disease at  catheterization.   Her current medication include:  1. Nexium.  2. Diltiazem 120 mg daily.  3. Metoprolol 50 mg one-half tablet daily.  4. Furosemide 80 mg daily.  5. Aspirin.  6. Enalapril 10 mg daily.  7. Crestor 20 mg daily.  8. Warfarin.  9. Niaspan.  10.Allopurinol.   On examination, the blood pressure is 93/51, the pulse 56 and irregular.  Venous pulsation was visible just below the clavicle.  The carotid  pulses were full.  The chest was clear.  The cardiac rhythm is  irregular.  He had no murmurs or gallops.  The abdomen was soft without  organomegaly.  Peripheral pulses were full.  There was trace peripheral  edema.   Electrocardiogram showed atrial fibrillation and was otherwise normal.   IMPRESSION:  1. Chronic atrial fibrillation.  2. Diastolic heart failure, now euvolemic.  3. Venous insufficiency of the lower extremities.  4. Nonobstructive coronary disease at catheterization.  5. Hypertension.  6. Hyperlipidemia.   RECOMMENDATIONS:  I think Tracy [name] is doing fairly well.  We had our  nurse talk to her today about the engage trial to see if she would be  interested.  We will have her come back in 3 weeks for the next Coumadin  check, for a lipid, liver,  BMP, and CBC.  Blood pressure is low, so  we will cut back on her Lotensin today from 10 to 5.  We will plan to  see her back in followup in 6 months.     Bruce Elvera Lennox Juanda Chance, MD, [name]  Electronically Signed    BRB/MedQ  DD: 09/21/2008  DT: 09/22/2008  Job #: [id number]

## 2010-12-03 NOTE — Assessment & Plan Note (Signed)
[facility]                            CARDIOLOGY OFFICE NOTE   Tracy [name], Tracy [name]                    MRN:          [id number]  DATE:10/28/2007                            DOB:          [birth year]    PRIMARY CARE PHYSICIAN:  Angus G. McInnis, MD.   CLINICAL HISTORY:  Tracy [name] is 77 years old and returns for followup  and management of her atrial fibrillation and diastolic heart failure.  She says she has been doing reasonably well. She has not had any  increase in swelling recently. She does state she had some increase in  palpitations that she notices when she lies down. She has been under a  great deal of stress because her husband has dementia and he is not  willing to have it evaluated and she has a difficult time managing him.  She has 2 children, one of whom works in the [employer] here in  [place] and another who works in the school system in [place].   PAST MEDICAL HISTORY:  Significant for hypertension, hyperlipidemia and  GERD. She also has documented nonobstructive disease at catheterization.   CURRENT MEDICATIONS:  1. Nexium.  2. Diltiazem 120 mg daily.  3. Metoprolol 50 mg 1-1/2 tablets daily.  4. Furosemide 80 mg daily.  5. Aspirin 81 mg daily.  6. Enalapril 10 mg daily.  7. Crestor 20 mg daily.  8. Warfarin as directed.   PHYSICAL EXAMINATION:  VITAL SIGNS:  The blood pressure is 114/59.  NECK:  There was no vein distention. Carotid pulses were full without  bruits.  CHEST:  Clear.  HEART:  Rhythm was irregular. There was a short systolic murmur at the  left sternal edge.  ABDOMEN:  Soft with normal bowel sounds. There was no  hepatosplenomegaly.  EXTREMITIES:  There was trace to 1+ edema bilaterally and there were  varicosities bilaterally.   IMPRESSION:  1. Chronic atrial fibrillation with some increased symptoms of      palpitations.  2. Diastolic heart failure but pretty well compensated.  3. Venous  insufficiency of the lower extremities.  4. Nonobstructive coronary disease at catheterization.  5. Hypertension.  6. Hyperlipidemia.   RECOMMENDATIONS:  Overall, I think Tracy [name] is fairly well. She does  have edema in the lower extremities but I do not see any neck vein  distention so I think that much of this is related to venous  insufficiency. This has not gotten any worse. She was having some  increased palpitations and she was taking metoprolol 50 mg 1-1/2 tablets  daily. This is not the long acting form so will plan to adjust this and  have her take 50 mg in the morning and 1/2 of 50 mg in the afternoon or  evening and hopefully this will improve her palpitations. Will get a BNP  and a CBC as well as an INR today and I will see her back in 6 months.     Bruce Elvera Lennox Juanda Chance, MD, [facility]  Electronically Signed    BRB/MedQ  DD: 10/28/2007  DT: 10/28/2007  Job #: [id number]

## 2010-12-03 NOTE — Assessment & Plan Note (Signed)
[facility]                            CARDIOLOGY OFFICE NOTE   TKEYA, STENCIL                    MRN:          [id number]  DATE:04/06/2008                            DOB:          02-02-1928    PRIMARY CARE PHYSICIAN:  Angus G. McInnis, MD   CLINICAL HISTORY:  Ms. Chunn is 77 years old and returned for  followup management of her atrial fibrillation and diastolic heart  failure.  She asked that I call her Corrie Dandy.  She said she has been doing  fairly well.  She does have intermittent palpitations, but she says she  has not had much in the way of swelling recently.  She has had no chest  pain.   PAST MEDICAL HISTORY:  Significant for hypertension, hyperlipidemia, and  GERD.  She has had nonobstructive disease with catheterization.   CURRENT MEDICATIONS:  1. Nexium.  2. Diltiazem 120 mg daily.  3. Metoprolol 50 mg one half tablet daily.  4. Furosemide 80 mg daily.  5. Aspirin.  6. Enalapril 10 mg daily.  7. Crestor 20 mg daily.  8. Warfarin.  9. Niaspan 500 mg daily.  10.Allopurinol, which was recently started by Dr. Renard Matter.   SOCIAL HISTORY:  She lives with her husband who has problems with early  dementia.  She has a daughter who works in the dialysis center here in  [place] and another daughter who works in the school system in  [place].   PHYSICAL EXAMINATION:  VITAL SIGNS:  The blood pressure is 120/61 and  pulse 60 and irregular.  NECK:  There was no venous distention.  The carotid pulses were full  without bruits.  CHEST:  Clear.  CARDIAC:  Rhythm was regular.  I could hear no murmurs or gallops.  ABDOMEN:  Soft with normal bowel sounds.  No hepatosplenomegaly.  EXTREMITIES:  Peripheral pulses were full, has no peripheral edema.  Trace edema in the lower extremities.   An EKG showed atrial fibrillation with a rate of 60.  There were mild  nonspecific ST-T changes.   IMPRESSION:  1. Chronic atrial fibrillation.  2.  Diastolic heart failure, now appears to be euvolemic.  3. Venous insufficiency of lower extremities.  4. Nonobstructive coronary artery disease at catheterization.  5. Hypertension.  6. Hyperlipidemia.   RECOMMENDATIONS:  I think Ms. Bearse is doing well.  She brought a  cholesterol panel in which was quite good.  She did have a BMP recently  by Dr. Renard Matter, and we will get that today.  I will see her back in 6  months.      Bruce Elvera Lennox Juanda Chance, MD, [facility]  Electronically Signed    BRB/MedQ  DD: 04/06/2008  DT: 04/06/2008  Job #: [id number]   cc:   Angus G. Renard Matter, MD

## 2010-12-03 NOTE — Assessment & Plan Note (Signed)
[facility]                            CARDIOLOGY OFFICE NOTE   Tracy [name], Tracy [name]                    MRN:          [id number]  DATE:04/21/2007                            DOB:          11/09/1927    PRIMARY CARE PHYSICIAN:  Dr. Ishmael Holter. [name].   CLINICAL HISTORY:  Tracy [name] is 77 years old and has chronic atrial  fibrillation, nonobstructive coronary artery disease, and congestive  heart failure related to diastolic dysfunction.  She has been doing  reasonably well over the past year, although she does have daily  palpitations.  She says she has not had much swelling, and not had too  much shortness of breath.   She is under a great deal of stress because her husband has severe  memory problems, and she has to deal with helping to take care of him.  She does have 2 daughters; one of whom works in [employer] in dialysis  unit.   PAST MEDICAL HISTORY:  1. Hypertension.  2. Hyperlipidemia.  3. Gastroesophageal reflux disease.   CURRENT MEDICATIONS:  1. Nexium.  2. Diltiazem.  3. Metoprolol.  4. Furosemide.  5. Aspirin.  6. Enalapril.  7. Lescol.  8. Coumadin.   PHYSICAL EXAMINATION:  The blood pressure was 133/73 and the pulse 74  and irregular.  There venous pulsation was visible distally at the clavicle.  The  carotid pulses were full and there were no bruits.  The chest was clear without rales or rhonchi.  Cardiac rhythm was irregular.  I could hear no murmurs or gallops.  The  heart sounds were normal.  The abdomen was soft with normal bowel sounds.  There was no  hepatosplenomegaly.  Peripheral pulses were full and there was no peripheral edema.   An electrocardiogram showed atrial fibrillation with a rate of 58.  The  ECG was otherwise normal.   IMPRESSION:  1. Chronic atrial fibrillation.  2. Congestive heart failure related to diastolic dysfunction, now      fairly well compensated.  3. Nonobstructive coronary  disease.  4. Hypertension.  5. Hyperlipidemia.  6. Venous insufficiency of the lower extremities.  7. Situational stress.   RECOMMENDATIONS:  I think Tracy [name] is doing reasonably well.  We  will get a BMP and CBC and BNP on her today.  If these look all right,  we will not change her therapy.  I will see her back in followup in 6  months.     Bruce Elvera Lennox Juanda Chance, MD, [facility]  Electronically Signed    BRB/MedQ  DD: 04/21/2007  DT: 04/21/2007  Job #: [id number]

## 2010-12-06 ENCOUNTER — Ambulatory Visit (INDEPENDENT_AMBULATORY_CARE_PROVIDER_SITE_OTHER): Payer: Medicare Other | Admitting: *Deleted

## 2010-12-06 DIAGNOSIS — I4891 Unspecified atrial fibrillation: Secondary | ICD-10-CM

## 2010-12-06 NOTE — Group Therapy Note (Signed)
   NAME:  Tracy [name], Tracy [name]                        ACCOUNT NO.:  [id number]   MEDICAL RECORD NO.:  [id number]                   PATIENT TYPE:  OBV   LOCATION:  A223                                 FACILITY:  [facility]   PHYSICIAN:  Angus G. Renard Matter, M.D.              DATE OF BIRTH:  Apr 13, 1928   DATE OF PROCEDURE:  DATE OF DISCHARGE:                                   PROGRESS NOTE   SUBJECTIVE:  This patient was admitted with anterior chest pain.  Electrocardiogram showed sinus rhythm with a first degree AV block.  Her  cardiac enzymes remained normal and she is relatively free of pain.   OBJECTIVE:  Blood pressure 132/51, respirations 20, pulse 68, temperature  97.2.  HEART:  Regular rhythm.  LUNGS:  Clear to P&A.  ABDOMEN:  No palpable  organs or masses.   ASSESSMENT:  The patient was admitted with precordial chest pain.   PLAN:  Plan to continue current regimen.  Will obtain a cardiology consult.                                               Angus G. Renard Matter, M.D.    AGM/MEDQ  D:  09/19/2002  T:  09/19/2002  Job:  [id number]

## 2010-12-06 NOTE — Cardiovascular Report (Signed)
NAME:  Tracy [name], Tracy [name]                          ACCOUNT NO.:  [id number]   MEDICAL RECORD NO.:  [id number]                   PATIENT TYPE:  INP   LOCATION:  3737                                 FACILITY:  [facility]   PHYSICIAN:  Jonelle Sidle, M.D. [facility]        DATE OF BIRTH:  1928-06-23   DATE OF PROCEDURE:  05/06/2002  DATE OF DISCHARGE:  05/07/2002                              CARDIAC CATHETERIZATION   INDICATION:  The patient is a 77 year old woman with a history of  hypertension, dyslipidemia, and atrial fibrillation status post previous  cardioversion who presents now with chest discomfort.  She underwent cardiac  catheterization approximately 10 years ago which revealed no significant  flow-limiting coronary atherosclerosis and has no subsequently history of  myocardial infarction.  She is referred now for repeat coronary angiography  to define the coronary anatomy.   PROCEDURES:  1. Left heart catheterization.  2. Selective coronary angiography.  3. Left ventriculography.   CARDIOLOGIST:  Jonelle Sidle, M.D.   ACCESS AND EQUIPMENT:  The area about the right femoral artery was  anesthetized with 1% lidocaine, and a 6-French sheath was placed in the  right femoral artery with use of modified Seldinger technique. Standard  preformed 6-French JL4 and JR4 catheters were used for selective coronary  angiography, and a 6-French angled pigtail catheter was used for left heart  catheterization and left ventriculography.  All exchanges were made over a  wire, and the patient tolerated the procedure well without any immediate  complications.   HEMODYNAMICS:  Left ventricle 170/17 mmHg, aorta 170/69 mmHg.   ANGIOGRAPHIC FINDINGS:  1. Left main coronary artery  was free of significant flow-limiting coronary     atherosclerosis.  There is some calcification distally.  2. Left anterior descending is a large caliber vessel with two diagonal     branches and a large septal  perforator.  There is proximal calcification     noted with only 20% stenosis followed by a 40% proximal stenosis just     after the takeoff of the septal perforator.  In the mid and distal     vessel, there are scattered 20% stenoses.  3. The circumflex coronary artery is a large vessel that is essentially a     large obtuse marginal branch with a small A-V groove portion of the     circumflex.  There are minor luminal irregularities noted within this     system.  4. The right coronary artery is a dominant vessel that has 20 to 30%     stenoses in the proximal to mid portion.   LEFT VENTRICULOGRAPHY:  Left ventriculography was performed in the RAO  projection and revealed an ejection fraction estimated at 60 to 65% with no  focal wall motion abnormalities and no significant mitral regurgitation.   DIAGNOSES:  1. Minor coronary atherosclerosis without low-limiting lesions.  2. Left ventricular ejection fraction of 60 to 65%.  RECOMMENDATIONS:  Continue aggressive risk factor modification.  Will change  Zantac to a proton pump inhibitor and observe the patient overnight.                                               Jonelle Sidle, M.D. [facility]    SGM/MEDQ  D:  05/06/2002  T:  05/08/2002  Job:  [id number]   cc:   Angus G. Renard Matter, M.D.   Thomas C. Wall, M.D. [facility]

## 2010-12-06 NOTE — H&P (Signed)
   NAME:  Tracy [name], Tracy [name]                        ACCOUNT NO.:  [id number]   MEDICAL RECORD NO.:  [id number]                   PATIENT TYPE:  OBV   LOCATION:  A223                                 FACILITY:  [facility]   PHYSICIAN:  Edward L. Juanetta Gosling, M.D.             DATE OF BIRTH:  September 03, 1927   DATE OF ADMISSION:  09/18/2002  DATE OF DISCHARGE:                                HISTORY & PHYSICAL   CONTINUATION:   SOCIAL HISTORY:  The patient does not smoke, she does not drink any alcohol.   FAMILY HISTORY:  There is apparently a family history of coronary disease,  although I do not know all of the details of that.   PHYSICAL EXAMINATION:  VITAL SIGNS:  Blood pressure 170/56, temperature  97.4, respirations 18, heart rate 76.  HEENT:  Her pupils are reactive to light and accommodation.  Nose and throat  are clear.  Mucous membranes are slightly dry.  CHEST:  Her chest is clear.  She does not have any chest wall tenderness.  HEART:  Regular and without gallop.  ABDOMEN:  Soft without masses.  EXTREMITIES:  Chronic venous changes and varicose veins.  Good pulses in the  extremities.   LABORATORY DATA:  EKG does not show acute changes.  Her white blood count  6900, hemoglobin 13.3, platelets 237.  Her prothrombin time is 27.8 seconds  with an INR of 3.2.  Her chemistry profile is normal.  Glucose 133, BUN 24  which is slightly high, creatinine 0.9.  Her cardiac enzymes show a CK of  57, MB of 1.8, troponin of 0.01.   ASSESSMENT:  She has chest pain which is atypical, but she is going to be  observed.  Will have serial cardiac enzymes and EKGs.  Follow up per Dr.  Renard Matter who is her regular physician.                                               Edward L. Juanetta Gosling, M.D.    ELH/MEDQ  D:  09/18/2002  T:  09/18/2002  Job:  [id number]

## 2010-12-06 NOTE — Procedures (Signed)
   NAME:  Tracy [name], Tracy [name]                        ACCOUNT NO.:  [id number]   MEDICAL RECORD NO.:  [id number]                   PATIENT TYPE:  OBV   LOCATION:  A223                                 FACILITY:  [facility]   PHYSICIAN:  Edward L. Juanetta Gosling, M.D.             DATE OF BIRTH:  Jun 12, 1928   DATE OF PROCEDURE:  09/18/2002  DATE OF DISCHARGE:                                EKG INTERPRETATION   EKG INTERPRETATION:  The rhythm is sinus rhythm with a rate in the 70s.  There is first-degree AV block.  PVCs are seen.  There are nonspecific ST-T  wave changes.  Abnormal electrocardiogram.                                               Oneal Deputy. Juanetta Gosling, M.D.    ELH/MEDQ  D:  09/18/2002  T:  09/18/2002  Job:  [id number]

## 2010-12-06 NOTE — Procedures (Signed)
   NAME:  VAUDIE, ENGEBRETSEN                          ACCOUNT NO.:  [id number]   MEDICAL RECORD NO.:  [id number]                   PATIENT TYPE:  INP   LOCATION:  3737                                 FACILITY:  [facility]   PHYSICIAN:  Edward L. Juanetta Gosling, M.D.             DATE OF BIRTH:  09-05-1927   DATE OF PROCEDURE:  05/06/2002  DATE OF DISCHARGE:  05/07/2002                                EKG INTERPRETATION   TIME/DATE:  1610, May 06, 2002.   DESCRIPTION OF PROCEDURE:  The rhythm is a sinus rhythm with a rate of 70.  There is a PVC.  There is slow R-wave progression across the precordium, and  this may indicate previous anterior infarction.  There are ST and T-wave  changes diffusely but most point lateral, which may indicate ischemia.   IMPRESSION:  Abnormal electrocardiogram.                                               Edward L. Juanetta Gosling, M.D.    ELH/MEDQ  D:  05/09/2002  T:  05/09/2002  Job:  [id number]

## 2010-12-06 NOTE — Consult Note (Signed)
NAME:  Tracy [name], Tracy [name]                        ACCOUNT NO.:  [id number]   MEDICAL RECORD NO.:  [id number]                   PATIENT TYPE:  OBV   LOCATION:  A223                                 FACILITY:  [facility]   PHYSICIAN:  Vida Roller, M.D.                DATE OF BIRTH:  1928-06-25   DATE OF CONSULTATION:  09/19/2002  DATE OF DISCHARGE:                                   CONSULTATION   REFERRING PHYSICIAN:  Oneal Deputy. Juanetta Gosling, M.D.   HISTORY OF PRESENT ILLNESS:  The patient is a 77 year old white female with  a history of hypertension, dyslipidemia, and atrial fibrillation who  presented for evaluation of substernal chest discomfort.  She is followed by  my colleague, Charlies Constable, M.D. [facility], in the clinic.  Recently had a heart  catheterization in October of last year which showed non-obstructive disease  with normal left ventricular function.  She states at that time that she had  an episode of discomfort in her chest and that is why they did the heart  catheterization and this discomfort that she describes today is similar to  her previous discomfort.  She was evaluated in the emergency department.  Was given some sublingual nitroglycerin with resolution of the discomfort.  Had no EKG changes.  There was no evidence of myocardial infarction on  serial cardiac enzymes.  She is currently pain-free.   PAST MEDICAL HISTORY:  1. Atrial fibrillation status post cardioversion during the hospitalization     in which she had the heart catheterization but she is currently on     Coumadin for this.  2. She also has gastroesophageal reflux disease.  3. Hypertension.  4. Hyperlipidemia.  5. History of a mitral valve prolapse on echocardiogram.   MEDICATIONS:  1. Nexium.  2. Coumadin adjusted in the [facility] office.  3. Amiodarone 200 mg daily.  4. Hydrochlorothiazide 12.5 mg daily for her high blood pressure.  5. She is on no medication for her hyperlipidemia.   SOCIAL  HISTORY:  She does not smoke.  She does not drink alcohol.  She lives  alone in [place], [place].   FAMILY HISTORY:  There is no significant coronary disease history.  Her  mother and father are both passed away of unknown causes.  She has no  siblings.   REVIEW OF SYSTEMS:  Noncontributory.   PAST SURGICAL HISTORY:  None.   PHYSICAL EXAMINATION:  VITAL SIGNS:  Blood pressure 170/56, respiratory rate  18, heart rate 76 and sinus.  She is afebrile.  HEENT:  Unremarkable.  NECK:  Supple with no jugular venous distention or carotid bruits.  There is  no lymphadenopathy.  Thyroid is normal size in the midline.  CHEST:  Clear to auscultation.  HEART:  Nondisplaced point of maximal impulse with no lifts or thrills.  First and second heart sounds are normal.  There is no mid systolic click.  There is no murmur noted.  ABDOMEN:  Soft, nontender.  Normoactive bowel sounds.  No  hepatosplenomegaly.  EXTREMITIES:  Lower extremities are without clubbing, cyanosis, edema.  2+  pulses throughout without bruits.  There is no ecchymoses seen.  NEUROLOGIC:  Nonfocal.  MUSCULOSKELETAL:  Unremarkable.   LABORATORIES:  Her electrocardiogram is normal sinus rhythm at a rate of 61  with a first degree AV block with a PR interval of 218 milliseconds.  The  remainder of her electrocardiogram is within normal limits with no Q-waves  and no evidence of ischemia.  Her white blood cell count is 6.9 with an H&H  of 13 and 39, platelet count of 279,000.  Her PT is 28.8 with an INR of 3.2.  Her chemistry panel is completely normal except a glucose of 133.  Cardiac  enzymes x3 are negative with CKs that are not above 60.   ASSESSMENT:  1. Chest discomfort which is atypical for coronary disease.  She has had a     recent heart catheterization with non-obstructive disease.  Has not had a     myocardial infarction.  The likelihood that this chest discomfort is     related to coronary disease is low.   However, with her known coronary     disease it is reasonable to consider progressive increase in her coronary     artery stenosis and we will recommend an Adenosine Cardiolite to assess     whether or not there is flow limiting stenosis.  2. History of atrial fibrillation.  She is currently in sinus rhythm and     this is unlikely to be the cause of her symptoms.  She could have     paroxysmal atrial fibrillation causing pain.  She is on amiodarone and     anticoagulation.  3. She has a history of hyperlipidemia.  She was previously on Lescol and     now is no longer on an antilipid drug.  It might be reasonable to restart     this as she does have non-obstructive coronary disease on previous heart     catheterization.  4. The mitral valve prolapse appears to be not an issue.  Might have been a     history from a previous echocardiogram.                                               Vida Roller, M.D.    JH/MEDQ  D:  09/19/2002  T:  09/19/2002  Job:  [id number]

## 2010-12-06 NOTE — Procedures (Signed)
   NAME:  Tracy [name], Tracy [name]                        ACCOUNT NO.:  [id number]   MEDICAL RECORD NO.:  [id number]                   PATIENT TYPE:  OBV   LOCATION:  A223                                 FACILITY:  [facility]   PHYSICIAN:  Vida Roller, M.D.                DATE OF BIRTH:  March 25, 1928   DATE OF PROCEDURE:  09/20/2002  DATE OF DISCHARGE:                                    STRESS TEST   ADENOSINE CARDIOLITE STUDY:   INDICATIONS:  This patient is a 77 year old female with history of  hypertension, dyslipidemia, and atrial fibrillation who had a heart  catheterization showing nonobstructive coronary disease with normal LV  function.  She presents to the hospital with recurrent atypical chest  discomfort.  She has ruled out for acute myocardial infarction on serial  enzymes.   BASELINE DATA:  EKG: Sinus rhythm at 62 beats/minute with nonspecific ST  abnormalities. Blood pressure 158/l90.   DESCRIPTION OF PROCEDURE:  37 mg of adenosine was infused over 4-minute  protocol.  Cardiolite was injected at 3 minutes.  The patient reported  facial flushing and fullness in chest that resolved in recovery.  No  ischemic changes noted on EKG.  A few PVCs and ventricular trigeminy noted  on EKG.  The final images and results are pending MD review.     Amy Mercy Riding, P.A. [facility]                     Vida Roller, M.D.    AB/MEDQ  D:  09/20/2002  T:  09/20/2002  Job:  [id number]

## 2010-12-06 NOTE — Group Therapy Note (Signed)
NAME:  Tracy [name], Tracy [name]                        ACCOUNT NO.:  [id number]   MEDICAL RECORD NO.:  [id number]                   PATIENT TYPE:  EMS   LOCATION:  ED                                   FACILITY:  [facility]   PHYSICIAN:  Mila Homer. Sudie Bailey, M.D.           DATE OF BIRTH:  [birth year]   DATE OF PROCEDURE:  DATE OF DISCHARGE:                                   PROGRESS NOTE   SUBJECTIVE:  The patient developed fairly severe anterior chest pain last  night that persisted throughout the night. It affects an area of the  sternum, extending from the epigastrium, up about 2 inches from the sternum.  She noted no acid brash in the mouth. She is taking a number of medicines  including Nexium 40 mg q.d. Cardiac catheterization less than a year ago,  done May 06, 2002 through Dr. Diona Browner showed a 20% stenosis proximally  in the LAD, a 40% proximal stenosis, and several 20% mid and distal  stenoses. The right coronary artery had a 20% to 30% stenoses in the  proximal and mid portion. Her left ventricular ejection fraction was 60% to  65%. She had a Cardiolite stress in the spring of 2004, which was  essentially normal, low risk for coronary disease.   The patient was put on Nexium 40 mg q.d. after that chest pain. She is also  now taking Maalox when she has pains like and has helped in the past but she  was out of Maalox last night. This pain was also worse than most pain that  she has had.   OBJECTIVE:  VITAL SIGNS: Her pulse is 75, respiratory rate 23, blood  pressure 143/59. O2 sat 99% on room air.  GENERAL: She is oriented and alert. No acute distress that I saw. Well  developed but thin.  CARDIAC: The heart had a regular rhythm with a rate of about 70. Heart  sounds distant.  LUNGS: Clear throughout. Moving air well. She area of pain she describes,  she showed me is extending from the epigastrium, up a couple of inches.  Superiorly through the sternum.  ABDOMEN: Soft without  hepatosplenomegaly or mass. There is no tenderness  today.  EXTREMITIES: There is no edema of the ankles.   Her workup included EKG, CBC, B-met, cardiac enzymes, portable chest x-ray,  PT and INR.   EKG was essentially normal.   INR was 2.8. Cardiac enzymes were negative.   ASSESSMENT:  Reflux esophagitis.    PLAN:  Recommend that she be discharged from the emergency room and obtain a  bottle of Maalox to have at home and use a couple of tablespoons when she  has pains like this. If the pain does not clear up with Maalox, can always  come to the emergency room. Full discussion.  Mila Homer. Sudie Bailey, M.D.    SDK/MEDQ  D:  03/27/2003  T:  03/27/2003  Job:  [id number]   cc:   Angus G. Renard Matter, M.D.  [address]  [place]  [place] [address]  Fax: [phone]

## 2010-12-06 NOTE — Assessment & Plan Note (Signed)
[facility]                              CARDIOLOGY OFFICE NOTE   Tracy [name], Tracy [name]                    MRN:          [id number]  DATE:02/09/2006                            DOB:          1927/08/10    PRIMARY CARE PHYSICIAN:  Foster Simpson, M.D.   HISTORY:  Tracy [name] is a 77 year old and has chronic atrial fibrillation  and mild congestive heart failure related to diastolic dysfunction.  She has  been doing fair since her last visit.  She says she has dizziness every day,  which she describes as lightheadedness and she is still bothered by  palpitations and some swelling in her legs.  I think the swelling in her  legs might be slightly better.  She has had no chest pain.   PAST MEDICAL HISTORY:  Significant for nonobstructive coronary disease and  catheterization, hypertension, hyperlipidemia and GERD.   CURRENT MEDICATIONS:  Coumadin, Lescol, Nexium, aspirin, enalapril,  Diltiazem, metoprolol and Lasix.   PHYSICAL EXAMINATION:  VITAL SIGNS:  Blood pressure is 131/71.  Pulse 59 and  regular.  NECK:  There is no venous distention.  The venous pulsation is visible 3 cm  above the clavicle.  The carotid pulses are full without bruits.  CHEST:  Clear without rales or rhonchi.  CARDIAC:  Rhythm was irregular.  No significant murmur.  ABDOMEN:  Soft without hepatosplenomegaly.  There was 1+ edema of the lower  extremities and there were varicosities on both sides.   An electrocardiogram showed atrial fibrillation with a rate of 60.  The ECG  was otherwise normal.   IMPRESSION:  1.  Chronic atrial fibrillation.  2.  Congestive heart failure related to diastolic dysfunction.  Fairly well      compensated.  3.  Nonobstructive coronary disease with catheterization.  4.  Hypertension.  5.  Hyperlipidemia.  6.  Venous insufficiency of lower extremities.  7.  Dizziness.   RECOMMENDATIONS:  I think Tracy [name] is doing reasonably well.  She  has  only mild volume overload, and with her renal insufficiency, I think we will  leave her diuretic dose the same.  Her potassium on a recent BMP was 5.1 and  we will repeat that today and see if this is  something that would require her to restrict her diet.  I will plan to see  her back in followup in six months.                               Bruce Elvera Lennox Juanda Chance, MD, [facility]    BRB/MedQ  DD:  02/09/2006  DT:  02/10/2006  Job #:  [id number]

## 2010-12-06 NOTE — Discharge Summary (Signed)
NAME:  Tracy [name], Tracy [name]                          ACCOUNT NO.:  [id number]   MEDICAL RECORD NO.:  [id number]                   PATIENT TYPE:  INP   LOCATION:  3737                                 FACILITY:  [facility]   PHYSICIAN:  Jesse Sans. Wall, M.D. [facility]            DATE OF BIRTH:  08-31-1927   DATE OF ADMISSION:  05/06/2002  DATE OF DISCHARGE:  05/07/2002                           DISCHARGE SUMMARY - REFERRING   PROCEDURES:  1. Cardiac catheterization.  2. Coronary arteriogram.  3. Left ventriculogram.   HOSPITAL COURSE:  The patient is a 77 year old female with a history of  cardiac catheterization but no previous intervention.  She has a history of  paroxysmal atrial fibrillation and is status post a recurrent cardioversion  x2.  She had previously been on Coumadin but this was discontinued.  Additionally she has a history of mitral valve prolapse.  She had onset of  substernal chest pain on the night before admission at approximately 10 p.m.  It was up to a 5/10 and it was associated with shortness of breath and  nausea, but no vomiting or diaphoresis.  There was no presyncope as well.  She tried Zantac which was no help.  The pain continued all night and then  at approximately 7 a.m. she went to [facility] and received  nitroglycerin and a GI cocktail, both of which gave her some relief.  She  was still complaining of chest pain.  The pain was unchanged by walking or  position.  There was no cough.  The episodes of pain prior to the one for  which she came to the emergency room were not as severe and relieved with  Zantac.  She has no exertional symptoms.  There was concern for a cardiac  etiology of her pain and with ongoing discomfort, she was taken to the cath  lab for further evaluation and treatment.   The patient had a cardiac catheterization which showed a calcified left main  but no stenosis and an LAD with a proximal 20 and 40% stenosis and a 20% mid  lesion.   The circumflex had a 30% proximal stenosis and the RCA had a 20-30%  proximal stenosis as well.  Her EF was 60-65% with no wall motion  abnormalities and no MR.  The films were reviewed by Dr. Diona Browner, who felt  that risk factor modification was indicated and the symptoms were likely GI  etiology.  Her Zantac was changed to a proton pump inhibitor and she was  observed overnight.   The next day her right groin was without hematoma.  She had no further  symptoms.  She was ambulating without shortness of breath or chest pain.  She was considered stable for discharge on May 07, 2002.   LABORATORY DATA:  Hemoglobin 12.5, hematocrit 36.5, wbc's 4.8, platelets  226.  Sodium 138, potassium 3.1, chloride 101, CO2 30, BUN 14, creatinine  1.1, glucose 98.  Serial CK-MB negative for MI.  Digoxin level less than  0.2.   Chest x-ray:  Borderline cardiomegaly with stable changes of COPD.   CONDITION ON DISCHARGE:  Stable.   DISCHARGE DIAGNOSES:  1. Chest pain, no critical coronary artery disease by cath, probably     secondary to gastrointestinal etiology.  2. Hypertension.  3. Hyperlipidemia.  4. Noncritical coronary artery disease by cath this admission.  5. History of mitral valve prolapse.  6. History of atrial fibrillation, status post cardioversion x2.  7. Remote history of Coumadin use (discontinue).  8. History of varicose veins.  9. History of allergies to amoxicillin, codeine, penicillin, and Trovan.  10.      Status post back surgery x3 and hernia repair.   DISCHARGE INSTRUCTIONS:   ACTIVITY:  Her activity level is to include no driving, sexual, or strenuous  activities for two days.   FOLLOW UP:  She is to call the office for problems with the cath site.  She  is to follow up with Dr. Renard Matter and call for an appointment.  She is to  follow up with Dr. Juanda Chance and the office will call.   DIET:  She is to stick to a diet that is low in fat.   DISCHARGE MEDICATIONS:  1.  Amiodarone 200 mg q.d.  2. Lescol 20 mg q.d.  3. HCTZ 25 mg one half tab q.d.  4. Coated aspirin 325 mg q.d.  5. She is not to take Zantac for now.  6. Nexium 40 mg q.d.       Lavella Hammock, P.A. [facility]                  Thomas C. Daleen Squibb, M.D. [facility]    RG/MEDQ  D:  05/07/2002  T:  05/08/2002  Job:  [id number]   cc:   Angus G. Renard Matter, M.D.   Charlies Constable, MD [facility]  [address]  [place]  [place] [address]

## 2010-12-06 NOTE — Procedures (Signed)
   NAME:  Tracy [name], Tracy [name]                        ACCOUNT NO.:  [id number]   MEDICAL RECORD NO.:  [id number]                   PATIENT TYPE:  OBV   LOCATION:  A223                                 FACILITY:  [facility]   PHYSICIAN:  Edward L. Juanetta Gosling, M.D.             DATE OF BIRTH:  1928-05-07   DATE OF PROCEDURE:  09/18/2002  DATE OF DISCHARGE:                                EKG INTERPRETATION   EKG INTERPRETATION:  The rhythm is sinus rhythm with a rate in the 60s.  There are PVCs.  There is first-degree AV block.  T wave abnormalities are  seen which are nonspecific.                                               Edward L. Juanetta Gosling, M.D.    ELH/MEDQ  D:  09/18/2002  T:  09/18/2002  Job:  [id number]

## 2010-12-06 NOTE — H&P (Signed)
   NAME:  Tracy [name], Tracy [name]                        ACCOUNT NO.:  [id number]   MEDICAL RECORD NO.:  [id number]                   PATIENT TYPE:  OBV   LOCATION:  A223                                 FACILITY:  [facility]   PHYSICIAN:  Edward L. Juanetta Gosling, M.D.             DATE OF BIRTH:  1927/08/01   DATE OF ADMISSION:  09/18/2002  DATE OF DISCHARGE:                                HISTORY & PHYSICAL   INDICATIONS FOR OBSERVATION:  Chest pain.   HISTORY OF PRESENT ILLNESS:  This is a 77 year old who came in with chest  discomfort who is in the middle of her chest, starting in the night, and is  reminiscent of pain that she has had before. She has had a previous history  of mitral valve prolapse. The pain was described as a 5-7 out of 10, burning  in nature and resolved in the emergency room with Nitroglycerin times three.  Compounding her history is the fact that she also has a history of  gastroesophageal reflux disease with mitral valve prolapse. She had a  cardiac catheterization done in October of 2003 which showed a number of  lesions which were felt to be non-obstructive. She has been taking Nexium,  Coumadin (we do not know the dose of that), Amiodarone 200 mg daily, and  HCTZ 12.5 mg daily.   PAST MEDICAL HISTORY:  Positive for the history of chest pain in the past,  possible gastroesophageal reflux disease, hypertension, hyperlipidemia,  mitral valve prolapse, and atrial fibrillation times two for which she is  taking Amiodarone.   Dictation was cut off at this point...Marland KitchenMarland Kitchen                                               Edward L. Juanetta Gosling, M.D.    ELH/MEDQ  D:  09/18/2002  T:  09/18/2002  Job:  [id number]

## 2010-12-06 NOTE — Procedures (Signed)
   NAME:  Tracy [name], Tracy [name]                          ACCOUNT NO.:  [id number]   MEDICAL RECORD NO.:  [id number]                   PATIENT TYPE:  INP   LOCATION:  3737                                 FACILITY:  [facility]   PHYSICIAN:  Edward L. Juanetta Gosling, M.D.             DATE OF BIRTH:  1927-11-29   DATE OF PROCEDURE:  DATE OF DISCHARGE:  05/07/2002                                EKG INTERPRETATION   The rhythm is sinus rhythm with a rate in the 60s.  There is a single PVC  seen.  Slow R wave progression across the precordium is less noticeable than  on the previous EKG.  ST segments are unusual but not distinctly abnormal.   IMPRESSION:  Abnormal EKG.                                               Edward L. Juanetta Gosling, M.D.    ELH/MEDQ  D:  06/06/2002  T:  06/06/2002  Job:  [id number]

## 2010-12-06 NOTE — Group Therapy Note (Signed)
   NAME:  Tracy [name], Tracy [name]                        ACCOUNT NO.:  [id number]   MEDICAL RECORD NO.:  [id number]                   PATIENT TYPE:  OBV   LOCATION:  A223                                 FACILITY:  [facility]   PHYSICIAN:  Angus G. Renard Matter, M.D.              DATE OF BIRTH:  03/17/1928   DATE OF PROCEDURE:  09/20/2002  DATE OF DISCHARGE:                                   PROGRESS NOTE   SUBJECTIVE:  This patient was admitted with chest pain and is scheduled for  adenosine Cardiolite study today.  She remains relatively asymptomatic.   OBJECTIVE:  VITAL SIGNS:  Blood pressure 148/64, respirations 20, pulse 64,  temperature 98.3.  LUNGS:  Decreased breath sounds.  HEART:  Regular rhythm.  ABDOMEN:  No palpable organs or masses.   ASSESSMENT:  The patient was admitted with chest pain and is being studied  for coronary artery disease.   PLAN:  Plan to continue current regimen.                                               Angus G. Renard Matter, M.D.    AGM/MEDQ  D:  09/20/2002  T:  09/20/2002  Job:  [id number]

## 2010-12-06 NOTE — Assessment & Plan Note (Signed)
[facility]                              CARDIOLOGY OFFICE NOTE   Tracy [name], Tracy [name]                    MRN:          [id number]  DATE:05/15/2006                            DOB:          1928-03-02    PRIMARY CARE PHYSICIAN:  Dr. Butch Penny.   CLINICAL HISTORY:  Tracy [name] is 77 years old, and has chronic atrial  fibrillation and congestive heart failure related to diastolic dysfunction.  She also has nonobstructive pulmonary disease.   She has been doing fairly well since her last visit, but she does state she  has had some symptoms of palpitations and dizziness.  She does not think the  swelling has gotten any worse than since her last visit.   PAST MEDICAL HISTORY:  Significant for:  1. Hypertension.  2. Hyperlipidemia.  3. Gastroesophageal reflux disease.   CURRENT MEDICATIONS:  Coumadin, Lescol, Nexium, aspirin, enalapril,  diltiazem, metoprolol, Lasix.   PHYSICAL EXAMINATION:  The blood pressure is 134/59, with no change with  lying and sitting, and the pulse was 73.  NECK:  The venous pulsation was visible 1 cm above the clavicle.  CHEST:  Clear without rales or rhonchi.  CARDIAC:  Rhythm was irregular.  I can hear no murmurs or gallops.  ABDOMEN:  Soft with normal bowel sounds.  There was no hepatosplenomegaly.  There was trace to 1+ edema, and there were varicosities of the lower  extremities.   Electrocardiogram showed atrial fibrillation with a rate of 77 and frequent  PVCs.  ECG was otherwise normal.   IMPRESSION:  1. Chronic atrial fibrillation.  2. Congestive heart failure related to diastolic dysfunction, fairly well      compensated.  3. Nonobstructive pulmonary disease.  4. Hypertension.  5. Hyperlipidemia.  6. Venous insufficiency of the lower extremities.   RECOMMENDATIONS:  I think Tracy [name] is doing fairly well.  She does have  a lot of PVCs on her electrocardiogram, symptoms of palpitations.  We  will  get a BMP to check her  electrolytes.  Also, Gearldine Bienenstock plans to talk to her today about possibly  being a candidate for the ROCKET trial.    ______________________________  Everardo Beals. Juanda Chance, MD, [facility]    BRB/MedQ  DD: 05/15/2006  DT: 05/16/2006  Job #: [id number]

## 2010-12-06 NOTE — Assessment & Plan Note (Signed)
[facility]                            CARDIOLOGY OFFICE NOTE   Tracy [name], [name]                      MRN:          [id number]  DATE:11/02/2006                            DOB:          Mar 10, 1928    PRIMARY CARE PHYSICIAN:  Dr. Butch Penny.   CLINICAL HISTORY:  Tracy [name] [name] is 77 years old and has chronic  atrial fibrillation, congestive heart failure, diastolic dysfunction,  and non obstructive coronary disease. Since she was here last she does  have occasional symptoms of palpitations and shortness of breath, and  chest tightness. These are related mostly to stress. She says she has  been under a great deal of stress with her husband who has been  forgetful and is quite demanding. They have been married for 58 years.  She has had no change in exertionally related symptoms.   PAST MEDICAL HISTORY:  Significant for hypertension, hyperlipidemia, and  GERD.   CURRENT MEDICATIONS:  Include; Coumadin, Lescol, Nexium, aspirin,  enalapril, diltiazem, metoprolol, Lasix, and calcium.   On examination today the blood pressure was 130/60, and the pulse 70 and  irregular. The venous pulsation visible 1 cm above the clavicle. The  carotid pulses were full without bruits.  CHEST: Clear without rales or rhonchi. The cardiac rhythm was irregular.  There was a late systolic murmur at the apex. There was no gallop.  ABDOMEN: Soft with normal bowel sounds. There was no hepatosplenomegaly.  There was 1 + edema bilaterally much of which was nonpitting. There were  varicosities bilaterally.   Her electrocardiogram showed atrial fibrillation with a rate of 63 and  poor R wave progression.   IMPRESSION:  1. Chronic atrial fibrillation.  2. Congestive heart failure related to diastolic dysfunction with mild      volume overload.  3. Non obstructive coronary disease.  4. Hypertension.  5. Hyperlipidemia.  6. Venous insufficiency in lower extremities.  7.  Situational stress.   RECOMMENDATIONS:  Tracy [name] [name] appears to have some mild-to-moderate  volume overload with elevated neck veins and peripheral edema. We will  increase her Lasix from 40 mg one and a half tablets a day to 2 tablets  a day. We will get a BMP and we will have her come  back in a week or 2 for repeat BMP as well as an echocardiogram. We will  get a CBC today as well. I will plan to see her back in 6 months.     Bruce Elvera Lennox Juanda Chance, MD, [facility]  Electronically Signed    BRB/MedQ  DD: 11/02/2006  DT: 11/02/2006  Job #: [id number]   cc:   Angus G. Renard Matter, MD

## 2010-12-09 ENCOUNTER — Encounter: Payer: Medicare Other | Admitting: *Deleted

## 2011-01-03 ENCOUNTER — Ambulatory Visit (INDEPENDENT_AMBULATORY_CARE_PROVIDER_SITE_OTHER): Payer: Medicare Other | Admitting: *Deleted

## 2011-01-03 DIAGNOSIS — I4891 Unspecified atrial fibrillation: Secondary | ICD-10-CM

## 2011-01-31 ENCOUNTER — Ambulatory Visit (INDEPENDENT_AMBULATORY_CARE_PROVIDER_SITE_OTHER): Payer: Medicare Other | Admitting: *Deleted

## 2011-01-31 DIAGNOSIS — I4891 Unspecified atrial fibrillation: Secondary | ICD-10-CM

## 2011-01-31 LAB — POCT INR: INR: 2.1

## 2011-02-28 ENCOUNTER — Ambulatory Visit (INDEPENDENT_AMBULATORY_CARE_PROVIDER_SITE_OTHER): Payer: Medicare Other | Admitting: *Deleted

## 2011-02-28 DIAGNOSIS — I4891 Unspecified atrial fibrillation: Secondary | ICD-10-CM

## 2011-03-14 ENCOUNTER — Ambulatory Visit (INDEPENDENT_AMBULATORY_CARE_PROVIDER_SITE_OTHER): Payer: Medicare Other | Admitting: *Deleted

## 2011-03-14 DIAGNOSIS — I4891 Unspecified atrial fibrillation: Secondary | ICD-10-CM

## 2011-03-25 ENCOUNTER — Other Ambulatory Visit (HOSPITAL_COMMUNITY): Payer: Self-pay | Admitting: Family Medicine

## 2011-03-25 DIAGNOSIS — Z139 Encounter for screening, unspecified: Secondary | ICD-10-CM

## 2011-04-02 ENCOUNTER — Encounter: Payer: Self-pay | Admitting: *Deleted

## 2011-04-02 ENCOUNTER — Encounter: Payer: Self-pay | Admitting: Cardiovascular Disease

## 2011-04-03 ENCOUNTER — Ambulatory Visit (INDEPENDENT_AMBULATORY_CARE_PROVIDER_SITE_OTHER): Payer: Medicare Other | Admitting: Cardiovascular Disease

## 2011-04-03 ENCOUNTER — Ambulatory Visit (INDEPENDENT_AMBULATORY_CARE_PROVIDER_SITE_OTHER): Payer: Medicare Other | Admitting: *Deleted

## 2011-04-03 ENCOUNTER — Encounter: Payer: Self-pay | Admitting: Cardiovascular Disease

## 2011-04-03 VITALS — BP 131/73 | HR 82 | Resp 16 | Ht 63.0 in | Wt 149.0 lb

## 2011-04-03 DIAGNOSIS — I4891 Unspecified atrial fibrillation: Secondary | ICD-10-CM

## 2011-04-03 DIAGNOSIS — I2510 Atherosclerotic heart disease of native coronary artery without angina pectoris: Secondary | ICD-10-CM

## 2011-04-03 NOTE — Progress Notes (Signed)
History of Present Illness:77 yo WF with history of CAD, chronic atrial fibrillation managed with rate control medications and Coumadin, diastolic CHF, HTN and hyperlipidemia here today for planned cardiac follow up. She's had non-obstructive CAD by catheterization in the past.   She is here today for follow up. She has been doing well. She has had no chest pain or SOB.  She had some dizziness but better lately.  She is on chronic coumadin therapy and was in the coumadin clinic today. Echo October 2011 with normal LV function, moderate AI, MR, TR.   Primary care is Dr. Renard Matter in [place].   Past Medical History  Diagnosis Date  . Hyperlipidemia   . Hypertension   . Coronary artery disease   . Venous insufficiency   . CHF (congestive heart failure)     acute diastolic heart failure  . Arrhythmia     chronic afib    Past Surgical History  Procedure Date  . Back surgery   . Hernia repair     Current Outpatient Prescriptions  Medication Sig Dispense Refill  . allopurinol (ZYLOPRIM) 100 MG tablet Take 100 mg by mouth daily.        Marland Kitchen aspirin 81 MG tablet Take 81 mg by mouth daily.        . calcium carbonate (OS-CAL) 600 MG TABS Take 600 mg by mouth daily.        Marland Kitchen diltiazem (CARDIZEM CD) 120 MG 24 hr capsule Take 1 capsule (120 mg total) by mouth daily.  30 capsule  6  . enalapril (VASOTEC) 10 MG tablet Take 10 mg by mouth daily.        . furosemide (LASIX) 80 MG tablet Take 1 tablet (80 mg total) by mouth daily.  30 tablet  11  . metoprolol (LOPRESSOR) 50 MG tablet Take 50 mg by mouth as directed. 1 qam and 1/2 po every evening prn       . niacin (NIASPAN) 500 MG CR tablet Take 500 mg by mouth at bedtime.        . rosuvastatin (CRESTOR) 20 MG tablet Take 20 mg by mouth daily.        Marland Kitchen warfarin (COUMADIN) 5 MG tablet Take as directed by Anticoagulation clinic   30 tablet  3    Allergies  Allergen Reactions  . Amoxicillin   . Penicillins   . Sulfonamide Derivatives      History   Social History  . Marital Status: Married    Spouse Name: N/A    Number of Children: N/A  . Years of Education: N/A   Occupational History  . retired    Social History Main Topics  . Smoking status: Never Smoker   . Smokeless tobacco: Not on file  . Alcohol Use: No  . Drug Use: Not on file  . Sexually Active: Not on file   Other Topics Concern  . Not on file   Social History Narrative  . No narrative on file    No family history on file.  Review of Systems:  As stated in the HPI and otherwise negative.   BP 131/73  Pulse 82  Resp 16  Ht 5\' 3"  (1.6 m)  Wt 149 lb (67.586 kg)  BMI 26.39 kg/m2  Physical Examination: General: Well developed, well nourished, NAD HEENT: OP clear, mucus membranes moist SKIN: warm, dry. No rashes. Neuro: No focal deficits Musculoskeletal: Muscle strength 5/5 all ext Psychiatric: Mood and affect normal Neck: No JVD, no carotid  bruits, no thyromegaly, no lymphadenopathy. Lungs:Clear bilaterally, no wheezes, rhonci, crackles Cardiovascular: Regular rate and rhythm. No murmurs, gallops or rubs. Abdomen:Soft. Bowel sounds present. Non-tender.  Extremities: No lower extremity edema. Pulses are 2 + in the bilateral DP/PT.  NWG:NFAOZH fibrillation, rate 68 bpm. PVC.

## 2011-04-03 NOTE — Assessment & Plan Note (Signed)
Stable.  NO changes. 

## 2011-04-03 NOTE — Patient Instructions (Signed)
Your physician wants you to follow-up in: 6 months  You will receive a reminder letter in the mail two months in advance. If you don't receive a letter, please call our office to schedule the follow-up appointment.  Your physician recommends that you continue on your current medications as directed. Please refer to the Current Medication list given to you today.  

## 2011-04-03 NOTE — Assessment & Plan Note (Signed)
Rate controlled on beta blocker and Cardizem. She is anticoagulated on coumadin.

## 2011-04-18 ENCOUNTER — Ambulatory Visit (HOSPITAL_COMMUNITY)
Admission: RE | Admit: 2011-04-18 | Discharge: 2011-04-18 | Disposition: A | Discharge status: Home / Self Care | Payer: Medicare Other | Source: Ambulatory Visit | Attending: Family Medicine | Admitting: Family Medicine

## 2011-04-18 DIAGNOSIS — Z139 Encounter for screening, unspecified: Secondary | ICD-10-CM

## 2011-04-18 DIAGNOSIS — Z1231 Encounter for screening mammogram for malignant neoplasm of breast: Secondary | ICD-10-CM | POA: Insufficient documentation

## 2011-05-02 ENCOUNTER — Ambulatory Visit (INDEPENDENT_AMBULATORY_CARE_PROVIDER_SITE_OTHER): Payer: Medicare Other | Admitting: *Deleted

## 2011-05-02 DIAGNOSIS — I4891 Unspecified atrial fibrillation: Secondary | ICD-10-CM

## 2011-05-02 LAB — POCT INR: INR: 2.8

## 2011-05-08 ENCOUNTER — Other Ambulatory Visit: Payer: Self-pay | Admitting: Cardiovascular Disease

## 2011-05-08 MED ORDER — METOPROLOL TARTRATE 50 MG PO TABS: 50.0000 mg | ORAL_TABLET | ORAL | Status: DC

## 2011-05-19 ENCOUNTER — Other Ambulatory Visit: Payer: Self-pay | Admitting: Cardiovascular Disease

## 2011-05-27 ENCOUNTER — Other Ambulatory Visit: Payer: Self-pay | Admitting: Cardiovascular Disease

## 2011-05-30 ENCOUNTER — Ambulatory Visit (INDEPENDENT_AMBULATORY_CARE_PROVIDER_SITE_OTHER): Payer: Medicare Other | Admitting: *Deleted

## 2011-05-30 DIAGNOSIS — I4891 Unspecified atrial fibrillation: Secondary | ICD-10-CM

## 2011-05-30 DIAGNOSIS — Z7901 Long term (current) use of anticoagulants: Secondary | ICD-10-CM

## 2011-07-11 ENCOUNTER — Ambulatory Visit (INDEPENDENT_AMBULATORY_CARE_PROVIDER_SITE_OTHER): Payer: Medicare Other | Admitting: *Deleted

## 2011-07-11 DIAGNOSIS — Z7901 Long term (current) use of anticoagulants: Secondary | ICD-10-CM

## 2011-07-11 DIAGNOSIS — I4891 Unspecified atrial fibrillation: Secondary | ICD-10-CM

## 2011-08-14 ENCOUNTER — Telehealth: Payer: Self-pay | Admitting: Cardiovascular Disease

## 2011-08-14 NOTE — Telephone Encounter (Signed)
New msg: Pt daughter calling stating that pt went to PCP and has bronchitis and flu "stuff" pt now c/o dizzy spells and wants to know if MD would advise pt come in to be evaluated prior to pt March 2013 appt. Please return pt daughter call to discuss further.

## 2011-08-14 NOTE — Telephone Encounter (Signed)
Spoke with pt's daughter. She states pt had bronchitis and flu type symptoms which started 2 weeks ago. Saw primary MD week or so ago and started on antibiotic. Was having dizzy spells at that time.  Pt has improved some since antibiotic started but still with lingering cough.  Daughter describes dizzy spells which occur 6-7 times per day. Described as feeling flushed in the face and blurry vision.  No weakness, numbness, chest pain, feeling of rapid heart rate or shortness of breath.  No syncope.  Lasts about a minute.  Pt does not check heart rate or blood pressure.  I asked daughter to contact primary MD to have these symptoms evaluated. Pt's appt with Dr. Clifton James will be moved up from March to Feb. 12 at 10:15.  Daughter agreeable with this plan.

## 2011-08-22 ENCOUNTER — Ambulatory Visit (INDEPENDENT_AMBULATORY_CARE_PROVIDER_SITE_OTHER): Payer: Medicare Other | Admitting: Pharmacist

## 2011-08-22 DIAGNOSIS — Z7901 Long term (current) use of anticoagulants: Secondary | ICD-10-CM

## 2011-08-22 DIAGNOSIS — I4891 Unspecified atrial fibrillation: Secondary | ICD-10-CM

## 2011-08-22 LAB — POCT INR: INR: 3.5

## 2011-08-26 ENCOUNTER — Other Ambulatory Visit: Payer: Self-pay | Admitting: Cardiovascular Disease

## 2011-09-02 ENCOUNTER — Ambulatory Visit (INDEPENDENT_AMBULATORY_CARE_PROVIDER_SITE_OTHER): Payer: Medicare Other

## 2011-09-02 ENCOUNTER — Encounter: Payer: Self-pay | Admitting: Cardiovascular Disease

## 2011-09-02 ENCOUNTER — Ambulatory Visit (INDEPENDENT_AMBULATORY_CARE_PROVIDER_SITE_OTHER): Payer: Medicare Other | Admitting: Cardiovascular Disease

## 2011-09-02 VITALS — BP 137/72 | HR 84 | Ht 63.0 in | Wt 144.8 lb

## 2011-09-02 DIAGNOSIS — I4891 Unspecified atrial fibrillation: Secondary | ICD-10-CM

## 2011-09-02 DIAGNOSIS — E785 Hyperlipidemia, unspecified: Secondary | ICD-10-CM

## 2011-09-02 DIAGNOSIS — I10 Essential (primary) hypertension: Secondary | ICD-10-CM

## 2011-09-02 DIAGNOSIS — Z7901 Long term (current) use of anticoagulants: Secondary | ICD-10-CM

## 2011-09-02 DIAGNOSIS — I2510 Atherosclerotic heart disease of native coronary artery without angina pectoris: Secondary | ICD-10-CM

## 2011-09-02 LAB — LIPID PANEL
Cholesterol: 149 mg/dL (ref 0–200)
HDL: 80.2 mg/dL (ref >39.00)
LDL Cholesterol: 55 mg/dL (ref 0–99)
VLDL: 13.6 mg/dL (ref 0.0–40.0)

## 2011-09-02 LAB — HEPATIC FUNCTION PANEL
ALT: 19 U/L (ref 0–35)
Total Bilirubin: 1 mg/dL (ref 0.3–1.2)
Total Protein: 6.6 g/dL (ref 6.0–8.3)

## 2011-09-02 LAB — POCT INR: INR: 2.2

## 2011-09-02 NOTE — Patient Instructions (Signed)
Your physician wants you to follow-up in: 6 months  You will receive a reminder letter in the mail two months in advance. If you don't receive a letter, please call our office to schedule the follow-up appointment.  Your physician recommends that you continue on your current medications as directed. Please refer to the Current Medication list given to you today.  

## 2011-09-02 NOTE — Assessment & Plan Note (Signed)
Continue statin and will check lipids and LFTs today.

## 2011-09-02 NOTE — Assessment & Plan Note (Signed)
BP is well controlled. No changes.  

## 2011-09-02 NOTE — Progress Notes (Signed)
History of Present Illness: [name] with history of CAD, chronic atrial fibrillation managed with rate control medications and Coumadin, diastolic CHF, HTN and hyperlipidemia here today for planned cardiac follow up. She's had non-obstructive CAD by catheterization in the past. Echo October 2011 with normal LV function, moderate AI, MR, TR.   She is here today for follow up. She has had a recent episode of bronchitis. She has occasional "stinging" type chest pains that last for one second.  She has occasional palpitations but no prolonged episodes. She is on chronic coumadin therapy and was in the coumadin clinic last week. She is not having any bleeding issues   Primary care is Dr. Renard Matter in [place].    Past Medical History  Diagnosis Date  . Hyperlipidemia   . Hypertension   . Coronary artery disease   . Venous insufficiency   . CHF (congestive heart failure)     acute diastolic heart failure  . Arrhythmia     chronic afib    Past Surgical History  Procedure Date  . Back surgery   . Hernia repair     Current Outpatient Prescriptions  Medication Sig Dispense Refill  . allopurinol (ZYLOPRIM) 100 MG tablet Take 100 mg by mouth daily.        Marland Kitchen aspirin 81 MG tablet Take 81 mg by mouth daily.        . calcium carbonate (OS-CAL) 600 MG TABS Take 600 mg by mouth daily.        Marland Kitchen CARDIZEM CD 120 MG 24 hr capsule TAKE (1) CAPSULE DAILY  30 each  5  . COUMADIN 5 MG tablet TAKE AS DIRECTED.  30 each  3  . furosemide (LASIX) 80 MG tablet Take 1 tablet (80 mg total) by mouth daily.  30 tablet  11  . metoprolol (LOPRESSOR) 50 MG tablet Take 1 tablet (50 mg total) by mouth as directed. 1 qam and 1/2 po every evening prn  45 tablet  6  . niacin (NIASPAN) 500 MG CR tablet Take 500 mg by mouth at bedtime.        . rosuvastatin (CRESTOR) 20 MG tablet Take 20 mg by mouth daily.        Marland Kitchen VASOTEC 10 MG tablet TAKE 1 TABLET DAILY  30 each  6    Allergies  Allergen Reactions  . Amoxicillin    . Penicillins   . Sulfonamide Derivatives     History   Social History  . Marital Status: Married    Spouse Name: N/A    Number of Children: N/A  . Years of Education: N/A   Occupational History  . retired    Social History Main Topics  . Smoking status: Never Smoker   . Smokeless tobacco: Not on file  . Alcohol Use: No  . Drug Use: Not on file  . Sexually Active: Not on file   Other Topics Concern  . Not on file   Social History Narrative  . No narrative on file    No family history on file.  Review of Systems:  As stated in the HPI and otherwise negative.   BP 137/72  Pulse 84  Ht 5\' 3"  (1.6 m)  Wt 144 lb 12.8 oz (65.681 kg)  BMI 25.65 kg/m2  Physical Examination: General: Well developed, well nourished, NAD HEENT: OP clear, mucus membranes moist SKIN: warm, dry. No rashes. Neuro: No focal deficits Musculoskeletal: Muscle strength 5/5 all ext Psychiatric: Mood and affect normal  Neck: No JVD, no carotid bruits, no thyromegaly, no lymphadenopathy. Lungs:Clear bilaterally, no wheezes, rhonci, crackles Cardiovascular: Irregular rate and rhythm. No murmurs, gallops or rubs. Abdomen:Soft. Bowel sounds present. Non-tender.  Extremities: No lower extremity edema. Pulses are 2 + in the bilateral DP/PT.  NWG:NFAOZH fibrillation, rate 80 bpm. PACs. Non-specific ST changes.

## 2011-09-02 NOTE — Progress Notes (Signed)
Addended by: Burnett Kanaris A on: 09/02/2011 11:58 AM   Modules accepted: Orders

## 2011-09-02 NOTE — Assessment & Plan Note (Signed)
Chronic persistent atrial fibrillation. She is on coumadin therapy. Good rate control with beta blocker and Cardizem.

## 2011-09-02 NOTE — Assessment & Plan Note (Signed)
Stable. No changes. Continue current therapy.  

## 2011-09-26 ENCOUNTER — Ambulatory Visit: Payer: Medicare Other | Admitting: Cardiovascular Disease

## 2011-10-03 ENCOUNTER — Ambulatory Visit (INDEPENDENT_AMBULATORY_CARE_PROVIDER_SITE_OTHER): Payer: Medicare Other | Admitting: Pharmacist

## 2011-10-03 DIAGNOSIS — Z7901 Long term (current) use of anticoagulants: Secondary | ICD-10-CM

## 2011-10-03 DIAGNOSIS — I4891 Unspecified atrial fibrillation: Secondary | ICD-10-CM

## 2011-10-03 LAB — POCT INR: INR: 2.5

## 2011-11-14 ENCOUNTER — Ambulatory Visit (INDEPENDENT_AMBULATORY_CARE_PROVIDER_SITE_OTHER): Payer: Medicare Other | Admitting: Pharmacist

## 2011-11-14 DIAGNOSIS — I4891 Unspecified atrial fibrillation: Secondary | ICD-10-CM

## 2011-11-14 DIAGNOSIS — Z7901 Long term (current) use of anticoagulants: Secondary | ICD-10-CM

## 2011-11-14 LAB — POCT INR: INR: 2.6

## 2011-11-25 ENCOUNTER — Other Ambulatory Visit: Payer: Self-pay | Admitting: Cardiovascular Disease

## 2011-12-25 ENCOUNTER — Other Ambulatory Visit: Payer: Self-pay | Admitting: Cardiovascular Disease

## 2011-12-25 NOTE — Telephone Encounter (Signed)
..   Requested Prescriptions   Pending Prescriptions Disp Refills  . LASIX 80 MG tablet [Pharmacy Med Name: LASIX 80MG  TABLET] 30 each 5    Sig: TAKE 1 TABLET DAILY

## 2011-12-30 ENCOUNTER — Ambulatory Visit (INDEPENDENT_AMBULATORY_CARE_PROVIDER_SITE_OTHER): Payer: Medicare Other | Admitting: Pharmacist

## 2011-12-30 DIAGNOSIS — Z7901 Long term (current) use of anticoagulants: Secondary | ICD-10-CM

## 2011-12-30 DIAGNOSIS — I4891 Unspecified atrial fibrillation: Secondary | ICD-10-CM

## 2011-12-30 LAB — POCT INR: INR: 2.7

## 2012-02-06 ENCOUNTER — Ambulatory Visit (INDEPENDENT_AMBULATORY_CARE_PROVIDER_SITE_OTHER): Payer: Medicare Other | Admitting: *Deleted

## 2012-02-06 DIAGNOSIS — I4891 Unspecified atrial fibrillation: Secondary | ICD-10-CM

## 2012-02-06 DIAGNOSIS — Z7901 Long term (current) use of anticoagulants: Secondary | ICD-10-CM

## 2012-02-06 LAB — POCT INR: INR: 2.5

## 2012-03-05 ENCOUNTER — Ambulatory Visit (INDEPENDENT_AMBULATORY_CARE_PROVIDER_SITE_OTHER): Payer: Medicare Other | Admitting: Cardiovascular Disease

## 2012-03-05 ENCOUNTER — Ambulatory Visit (INDEPENDENT_AMBULATORY_CARE_PROVIDER_SITE_OTHER): Payer: Medicare Other | Admitting: Pharmacist

## 2012-03-05 ENCOUNTER — Encounter: Payer: Self-pay | Admitting: Cardiovascular Disease

## 2012-03-05 VITALS — BP 134/53 | HR 64 | Ht 63.0 in | Wt 147.0 lb

## 2012-03-05 DIAGNOSIS — I4891 Unspecified atrial fibrillation: Secondary | ICD-10-CM

## 2012-03-05 DIAGNOSIS — I2510 Atherosclerotic heart disease of native coronary artery without angina pectoris: Secondary | ICD-10-CM

## 2012-03-05 DIAGNOSIS — Z7901 Long term (current) use of anticoagulants: Secondary | ICD-10-CM

## 2012-03-05 DIAGNOSIS — I080 Rheumatic disorders of both mitral and aortic valves: Secondary | ICD-10-CM

## 2012-03-05 LAB — POCT INR: INR: 2.2

## 2012-03-05 NOTE — Assessment & Plan Note (Signed)
Mild CAD by cath 2003. No exertional chest pain. No ischemic w/u at this time.

## 2012-03-05 NOTE — Assessment & Plan Note (Signed)
Will repeat echo to assess.  

## 2012-03-05 NOTE — Patient Instructions (Addendum)

## 2012-03-05 NOTE — Progress Notes (Signed)
History of Present Illness: [name] with history of CAD, chronic atrial fibrillation managed with rate control medications and Coumadin, diastolic CHF, HTN and hyperlipidemia here today for planned cardiac follow up. She's had non-obstructive CAD by catheterization in 2003 with 40% mid LAD, plaque disease in other vessels.  Echo October 2011 with normal LV function, moderate AI, MR, TR.   She is here today for follow up. She is on chronic coumadin therapy and was in the coumadin clinic today with INR of 2.2. NO bleeding issues.  She has an occasional stinging type chest pain that occurs once per month. This is at rest. No exertional chest pain. Breathing ok. Still having some LE edema which resolves after Lasix each day.   Primary care is Dr. Renard [name] in [place].    Past Medical History  Diagnosis Date  . Hyperlipidemia   . Hypertension   . Coronary artery disease   . Venous insufficiency   . CHF (congestive heart failure)     acute diastolic heart failure  . Arrhythmia     chronic afib    Past Surgical History  Procedure Date  . Back surgery   . Hernia repair     Current Outpatient Prescriptions  Medication Sig Dispense Refill  . allopurinol (ZYLOPRIM) 100 MG tablet Take 100 mg by mouth daily.        Marland Kitchen aspirin 81 MG tablet Take 81 mg by mouth daily.        . calcium carbonate (OS-CAL) 600 MG TABS Take 600 mg by mouth daily.        Marland Kitchen CARDIZEM CD 120 MG 24 hr capsule TAKE (1) CAPSULE DAILY  30 each  6  . COUMADIN 5 MG tablet TAKE AS DIRECTED.  30 each  3  . LASIX 80 MG tablet TAKE 1 TABLET DAILY  30 each  5  . metoprolol (LOPRESSOR) 50 MG tablet Take 1 tablet (50 mg total) by mouth as directed. 1 qam and 1/2 po every evening prn  45 tablet  6  . niacin (NIASPAN) 500 MG CR tablet Take 500 mg by mouth at bedtime.        . rosuvastatin (CRESTOR) 20 MG tablet Take 20 mg by mouth daily.        Marland Kitchen VASOTEC 10 MG tablet TAKE 1 TABLET DAILY  30 each  6    Allergies  Allergen  Reactions  . Amoxicillin   . Penicillins   . Sulfonamide Derivatives     History   Social History  . Marital Status: Married    Spouse Name: N/A    Number of Children: N/A  . Years of Education: N/A   Occupational History  . retired    Social History Main Topics  . Smoking status: Never Smoker   . Smokeless tobacco: Not on file  . Alcohol Use: No  . Drug Use: Not on file  . Sexually Active: Not on file   Other Topics Concern  . Not on file   Social History Narrative  . No narrative on file    No family history on file.  Review of Systems:  As stated in the HPI and otherwise negative.   BP 134/53  Pulse 64  Ht 5\' 3"  (1.6 m)  Wt 147 lb (66.679 kg)  BMI 26.04 kg/m2  Physical Examination: General: Well developed, well nourished, NAD HEENT: OP clear, mucus membranes moist SKIN: warm, dry. No rashes. Neuro: No focal deficits Musculoskeletal: Muscle strength 5/5 all  ext Psychiatric: Mood and affect normal Neck: No JVD, no carotid bruits, no thyromegaly, no lymphadenopathy. Lungs:Clear bilaterally, no wheezes, rhonci, crackles Cardiovascular: Irregular rate and rhythm. Systolic murmur. No gallops or rubs. Abdomen:Soft. Bowel sounds present. Non-tender.  Extremities: No lower extremity edema. Pulses are 2 + in the bilateral DP/PT.

## 2012-03-05 NOTE — Assessment & Plan Note (Signed)
Rate controlled. Continue Cardizem. She is on coumadin for anti-coagulation and doing well. No changes.

## 2012-03-16 ENCOUNTER — Other Ambulatory Visit (HOSPITAL_COMMUNITY): Payer: Self-pay | Admitting: Family Medicine

## 2012-03-16 DIAGNOSIS — Z139 Encounter for screening, unspecified: Secondary | ICD-10-CM

## 2012-03-19 ENCOUNTER — Other Ambulatory Visit: Payer: Self-pay | Admitting: Cardiovascular Disease

## 2012-03-19 ENCOUNTER — Ambulatory Visit (HOSPITAL_COMMUNITY): Payer: Medicare Other | Attending: Cardiovascular Disease | Admitting: Radiology

## 2012-03-19 DIAGNOSIS — I079 Rheumatic tricuspid valve disease, unspecified: Secondary | ICD-10-CM | POA: Insufficient documentation

## 2012-03-19 DIAGNOSIS — I379 Nonrheumatic pulmonary valve disorder, unspecified: Secondary | ICD-10-CM | POA: Insufficient documentation

## 2012-03-19 DIAGNOSIS — I4891 Unspecified atrial fibrillation: Secondary | ICD-10-CM | POA: Insufficient documentation

## 2012-03-19 DIAGNOSIS — I509 Heart failure, unspecified: Secondary | ICD-10-CM | POA: Insufficient documentation

## 2012-03-19 DIAGNOSIS — I080 Rheumatic disorders of both mitral and aortic valves: Secondary | ICD-10-CM | POA: Insufficient documentation

## 2012-03-19 DIAGNOSIS — I2510 Atherosclerotic heart disease of native coronary artery without angina pectoris: Secondary | ICD-10-CM | POA: Insufficient documentation

## 2012-03-19 DIAGNOSIS — I10 Essential (primary) hypertension: Secondary | ICD-10-CM | POA: Insufficient documentation

## 2012-03-19 NOTE — Progress Notes (Signed)
Echocardiogram performed.  

## 2012-04-16 ENCOUNTER — Ambulatory Visit (INDEPENDENT_AMBULATORY_CARE_PROVIDER_SITE_OTHER): Payer: Medicare Other

## 2012-04-16 DIAGNOSIS — Z7901 Long term (current) use of anticoagulants: Secondary | ICD-10-CM

## 2012-04-16 DIAGNOSIS — I4891 Unspecified atrial fibrillation: Secondary | ICD-10-CM

## 2012-04-16 LAB — POCT INR: INR: 3.5

## 2012-04-26 ENCOUNTER — Ambulatory Visit (HOSPITAL_COMMUNITY): Payer: Medicare Other

## 2012-04-29 ENCOUNTER — Ambulatory Visit (HOSPITAL_COMMUNITY)
Admission: RE | Admit: 2012-04-29 | Discharge: 2012-04-29 | Disposition: A | Discharge status: Home / Self Care | Payer: Medicare Other | Source: Ambulatory Visit | Attending: Family Medicine | Admitting: Family Medicine

## 2012-04-29 DIAGNOSIS — Z1231 Encounter for screening mammogram for malignant neoplasm of breast: Secondary | ICD-10-CM | POA: Insufficient documentation

## 2012-04-29 DIAGNOSIS — Z139 Encounter for screening, unspecified: Secondary | ICD-10-CM

## 2012-05-14 ENCOUNTER — Ambulatory Visit (INDEPENDENT_AMBULATORY_CARE_PROVIDER_SITE_OTHER): Payer: Medicare Other

## 2012-05-14 DIAGNOSIS — Z7901 Long term (current) use of anticoagulants: Secondary | ICD-10-CM

## 2012-05-14 DIAGNOSIS — I4891 Unspecified atrial fibrillation: Secondary | ICD-10-CM

## 2012-06-01 ENCOUNTER — Other Ambulatory Visit: Payer: Self-pay | Admitting: Cardiovascular Disease

## 2012-06-11 ENCOUNTER — Ambulatory Visit (INDEPENDENT_AMBULATORY_CARE_PROVIDER_SITE_OTHER): Payer: Medicare Other | Admitting: *Deleted

## 2012-06-11 DIAGNOSIS — I4891 Unspecified atrial fibrillation: Secondary | ICD-10-CM

## 2012-06-11 DIAGNOSIS — Z7901 Long term (current) use of anticoagulants: Secondary | ICD-10-CM

## 2012-06-21 ENCOUNTER — Encounter (INDEPENDENT_AMBULATORY_CARE_PROVIDER_SITE_OTHER): Payer: Self-pay | Admitting: *Deleted

## 2012-06-22 ENCOUNTER — Other Ambulatory Visit: Payer: Self-pay | Admitting: Cardiovascular Disease

## 2012-06-22 ENCOUNTER — Encounter (INDEPENDENT_AMBULATORY_CARE_PROVIDER_SITE_OTHER): Payer: Self-pay

## 2012-07-09 ENCOUNTER — Ambulatory Visit (INDEPENDENT_AMBULATORY_CARE_PROVIDER_SITE_OTHER): Payer: Medicare Other | Admitting: Pharmacist

## 2012-07-09 DIAGNOSIS — I4891 Unspecified atrial fibrillation: Secondary | ICD-10-CM

## 2012-07-09 DIAGNOSIS — Z7901 Long term (current) use of anticoagulants: Secondary | ICD-10-CM

## 2012-08-16 ENCOUNTER — Telehealth (INDEPENDENT_AMBULATORY_CARE_PROVIDER_SITE_OTHER): Payer: Self-pay | Admitting: *Deleted

## 2012-08-16 ENCOUNTER — Other Ambulatory Visit (INDEPENDENT_AMBULATORY_CARE_PROVIDER_SITE_OTHER): Payer: Self-pay | Admitting: *Deleted

## 2012-08-16 DIAGNOSIS — Z86010 Personal history of colonic polyps: Secondary | ICD-10-CM

## 2012-08-16 DIAGNOSIS — Z1211 Encounter for screening for malignant neoplasm of colon: Secondary | ICD-10-CM

## 2012-08-16 MED ORDER — PEG-KCL-NACL-NASULF-NA ASC-C 100 G PO SOLR: 1.0000 | Freq: Once | ORAL | Status: DC

## 2012-08-16 NOTE — Telephone Encounter (Signed)
Patient needs movi prep 

## 2012-08-20 ENCOUNTER — Ambulatory Visit (INDEPENDENT_AMBULATORY_CARE_PROVIDER_SITE_OTHER): Payer: Medicare Other

## 2012-08-20 DIAGNOSIS — Z7901 Long term (current) use of anticoagulants: Secondary | ICD-10-CM

## 2012-08-20 DIAGNOSIS — I4891 Unspecified atrial fibrillation: Secondary | ICD-10-CM

## 2012-08-20 LAB — POCT INR: INR: 3.3

## 2012-09-01 ENCOUNTER — Telehealth (INDEPENDENT_AMBULATORY_CARE_PROVIDER_SITE_OTHER): Payer: Self-pay | Admitting: *Deleted

## 2012-09-01 NOTE — Telephone Encounter (Signed)
  Procedure: tcs  Reason/Indication:  Hx polyps  Has patient had this procedure before?  yes  If so, when, by whom and where?    Is there a family history of colon cancer?  , 2010 (scanned)  Who?  What age when diagnosed?    Is patient diabetic?   no      Does patient have prosthetic heart valve?  no  Do you have a pacemaker?  no  Has patient had joint replacement within last 12 months?  no  Is patient on Coumadin, Plavix and/or Aspirin? yes  Medications: asa 81 mg daily, warfarin 5 mg tues & sat and 1/2 tab the other days, allopurinol 100 mg daily, oscal 600 mg bid, diltiazem 120 mg daily, furosemide 80 mg daily, metoprolol 50 mg daily, niaspan 500 mg nightly, crestor 20 mg daily, enalapril 10 mg daily, meclizine 12.5 mg prn  Allergies: amoxicilin, pcn, sulfur  Medication Adjustment: asa 2 days, warfarin 5 days  Procedure date & time: 09/30/12 at 930

## 2012-09-01 NOTE — Telephone Encounter (Signed)
agree

## 2012-09-17 ENCOUNTER — Ambulatory Visit (INDEPENDENT_AMBULATORY_CARE_PROVIDER_SITE_OTHER): Payer: Medicare Other | Admitting: Cardiovascular Disease

## 2012-09-17 ENCOUNTER — Encounter: Payer: Self-pay | Admitting: Cardiovascular Disease

## 2012-09-17 ENCOUNTER — Ambulatory Visit (INDEPENDENT_AMBULATORY_CARE_PROVIDER_SITE_OTHER): Payer: Medicare Other | Admitting: *Deleted

## 2012-09-17 VITALS — BP 126/62 | HR 68 | Ht 63.0 in | Wt 145.0 lb

## 2012-09-17 DIAGNOSIS — I4891 Unspecified atrial fibrillation: Secondary | ICD-10-CM

## 2012-09-17 DIAGNOSIS — I2510 Atherosclerotic heart disease of native coronary artery without angina pectoris: Secondary | ICD-10-CM

## 2012-09-17 DIAGNOSIS — I350 Nonrheumatic aortic (valve) stenosis: Secondary | ICD-10-CM

## 2012-09-17 DIAGNOSIS — Z7901 Long term (current) use of anticoagulants: Secondary | ICD-10-CM

## 2012-09-17 DIAGNOSIS — I359 Nonrheumatic aortic valve disorder, unspecified: Secondary | ICD-10-CM

## 2012-09-17 LAB — LIPID PANEL
Cholesterol: 136 mg/dL (ref 0–200)
HDL: 65.4 mg/dL (ref >39.00)
LDL Cholesterol: 57 mg/dL (ref 0–99)
Total CHOL/HDL Ratio: 2
Triglycerides: 69 mg/dL (ref 0.0–149.0)

## 2012-09-17 LAB — HEPATIC FUNCTION PANEL
Albumin: 4 g/dL (ref 3.5–5.2)
Total Protein: 6.7 g/dL (ref 6.0–8.3)

## 2012-09-17 LAB — POCT INR: INR: 2.9

## 2012-09-17 NOTE — Progress Notes (Signed)
History of Present Illness: [name] with history of CAD, chronic atrial fibrillation managed with rate control medications and Coumadin, diastolic CHF, HTN and hyperlipidemia here today for planned cardiac follow up. She's had non-obstructive CAD by catheterization in 2003 with 40% mid LAD, plaque disease in other vessels. Echo August 2013 normal LV function, mild MR, Mild AI, mild AS, mild TR.    She is here today for follow up. She is on chronic coumadin therapy and is followed in our coumadin clinic.  No bleeding issues. No exertional chest pain. Breathing ok.   Primary Care Physician: Butch Penny  Last Lipid Profile:Lipid Panel     Component Value Date/Time   CHOL 149 09/02/2011 1047   TRIG 68.0 09/02/2011 1047   HDL 80.20 09/02/2011 1047   CHOLHDL 2 09/02/2011 1047   VLDL 13.6 09/02/2011 1047   LDLCALC 55 09/02/2011 1047     Past Medical History  Diagnosis Date  . Hyperlipidemia   . Hypertension   . Coronary artery disease   . Venous insufficiency   . CHF (congestive heart failure)     acute diastolic heart failure  . Arrhythmia     chronic afib    Past Surgical History  Procedure Laterality Date  . Back surgery    . Hernia repair      Current Outpatient Prescriptions  Medication Sig Dispense Refill  . allopurinol (ZYLOPRIM) 100 MG tablet Take 100 mg by mouth daily.        Marland Kitchen aspirin 81 MG tablet Take 81 mg by mouth daily.        . calcium carbonate (OS-CAL) 600 MG TABS Take 600 mg by mouth daily.        Marland Kitchen diltiazem (CARDIZEM CD) 120 MG 24 hr capsule TAKE (1) CAPSULE DAILY  30 capsule  6  . LASIX 80 MG tablet TAKE 1 TABLET DAILY  30 each  5  . LOPRESSOR 50 MG tablet TAKE 1 TABLET EVERY MORNING AND 1/2 TABLET IN THE EVENING AS NEEDED  45 each  6  . niacin (NIASPAN) 500 MG CR tablet Take 500 mg by mouth at bedtime.        . peg 3350 powder (MOVIPREP) 100 G SOLR Take 1 kit (100 g total) by mouth once.  1 kit  0  . rosuvastatin (CRESTOR) 20 MG tablet Take 20 mg by  mouth daily.        Marland Kitchen VASOTEC 10 MG tablet TAKE 1 TABLET DAILY  30 each  6  . warfarin (COUMADIN) 5 MG tablet TAKE AS DIRECTED.  30 tablet  3   No current facility-administered medications for this visit.    Allergies  Allergen Reactions  . Amoxicillin   . Penicillins   . Sulfonamide Derivatives     History   Social History  . Marital Status: Married    Spouse Name: N/A    Number of Children: N/A  . Years of Education: N/A   Occupational History  . retired    Social History Main Topics  . Smoking status: Never Smoker   . Smokeless tobacco: Not on file  . Alcohol Use: No  . Drug Use: Not on file  . Sexually Active: Not on file   Other Topics Concern  . Not on file   Social History Narrative  . No narrative on file    No family history on file.  Review of Systems:  As stated in the HPI and otherwise negative.   BP  126/62  Pulse 68  Ht 5\' 3"  (1.6 m)  Wt 145 lb (65.772 kg)  BMI 25.69 kg/m2  Physical Examination: General: Well developed, well nourished, NAD HEENT: OP clear, mucus membranes moist SKIN: warm, dry. No rashes. Neuro: No focal deficits Musculoskeletal: Muscle strength 5/5 all ext Psychiatric: Mood and affect normal Neck: No JVD, no carotid bruits, no thyromegaly, no lymphadenopathy. Lungs:Clear bilaterally, no wheezes, rhonci, crackles Cardiovascular: Regular rate and rhythm. Mild systolic murmur. No gallops or rubs. Abdomen:Soft. Bowel sounds present. Non-tender.  Extremities: No lower extremity edema. Pulses are 2 + in the bilateral DP/PT.    Echo 03/19/12: Left ventricle: The cavity size was normal. Wall thickness was normal. Systolic function was normal. The estimated ejection fraction was in the range of 55% to 60%. - Aortic valve: Calcified non coronary cusp There was mild stenosis. Mild regurgitation. Mean gradient: 13mm Hg (S). Peak gradient: 23mm Hg (S). - Mitral valve: Mild regurgitation. - Left atrium: The atrium was severely  dilated. - Right atrium: The atrium was moderately dilated. - Atrial septum: No defect or patent foramen ovale was identified. - Tricuspid valve: Moderate regurgitation. - Pulmonary arteries: PA peak pressure: 40mm Hg (S).   Assessment and Plan:   1. Atria fibrillation: Rate controlled. Continue Cardizem for rate control. Continue coumadin for anti-coagulation.   2. CAD: Mild by cath 2003. She has had no exertional chest pain. Continue current therapy. Will check lipids and LFTS today.   3. Mitral regurgitation: Mild by echo August 2013.   4. Aortic stenosis: Mild by echo August 2013. Will repeat echo in one year.

## 2012-09-17 NOTE — Patient Instructions (Addendum)
Your physician wants you to follow-up in:  6 months. You will receive a reminder letter in the mail two months in advance. If you don't receive a letter, please call our office to schedule the follow-up appointment.   

## 2012-09-20 ENCOUNTER — Encounter (HOSPITAL_COMMUNITY): Payer: Self-pay | Admitting: Pharmacy Technician

## 2012-09-30 ENCOUNTER — Encounter (HOSPITAL_COMMUNITY)
Admission: RE | Disposition: A | Discharge status: Home / Self Care | Payer: Self-pay | Source: Ambulatory Visit | Attending: Internal Medicine

## 2012-09-30 ENCOUNTER — Other Ambulatory Visit: Payer: Self-pay | Admitting: Cardiovascular Disease

## 2012-09-30 ENCOUNTER — Ambulatory Visit (HOSPITAL_COMMUNITY)
Admission: RE | Admit: 2012-09-30 | Discharge: 2012-09-30 | Disposition: A | Discharge status: Home / Self Care | Payer: Medicare Other | Source: Ambulatory Visit | Attending: Internal Medicine | Admitting: Internal Medicine

## 2012-09-30 ENCOUNTER — Encounter (HOSPITAL_COMMUNITY): Payer: Self-pay | Admitting: *Deleted

## 2012-09-30 DIAGNOSIS — K644 Residual hemorrhoidal skin tags: Secondary | ICD-10-CM

## 2012-09-30 DIAGNOSIS — K5730 Diverticulosis of large intestine without perforation or abscess without bleeding: Secondary | ICD-10-CM | POA: Insufficient documentation

## 2012-09-30 DIAGNOSIS — I4891 Unspecified atrial fibrillation: Secondary | ICD-10-CM | POA: Insufficient documentation

## 2012-09-30 DIAGNOSIS — Z860100 Personal history of colon polyps, unspecified: Secondary | ICD-10-CM | POA: Insufficient documentation

## 2012-09-30 DIAGNOSIS — Z79899 Other long term (current) drug therapy: Secondary | ICD-10-CM | POA: Insufficient documentation

## 2012-09-30 DIAGNOSIS — Z880 Allergy status to penicillin: Secondary | ICD-10-CM | POA: Insufficient documentation

## 2012-09-30 DIAGNOSIS — I872 Venous insufficiency (chronic) (peripheral): Secondary | ICD-10-CM | POA: Insufficient documentation

## 2012-09-30 DIAGNOSIS — Z7982 Long term (current) use of aspirin: Secondary | ICD-10-CM | POA: Insufficient documentation

## 2012-09-30 DIAGNOSIS — E785 Hyperlipidemia, unspecified: Secondary | ICD-10-CM | POA: Insufficient documentation

## 2012-09-30 DIAGNOSIS — I509 Heart failure, unspecified: Secondary | ICD-10-CM | POA: Insufficient documentation

## 2012-09-30 DIAGNOSIS — D126 Benign neoplasm of colon, unspecified: Secondary | ICD-10-CM

## 2012-09-30 DIAGNOSIS — Z1211 Encounter for screening for malignant neoplasm of colon: Secondary | ICD-10-CM | POA: Insufficient documentation

## 2012-09-30 DIAGNOSIS — K219 Gastro-esophageal reflux disease without esophagitis: Secondary | ICD-10-CM | POA: Insufficient documentation

## 2012-09-30 DIAGNOSIS — Z7901 Long term (current) use of anticoagulants: Secondary | ICD-10-CM | POA: Insufficient documentation

## 2012-09-30 DIAGNOSIS — I10 Essential (primary) hypertension: Secondary | ICD-10-CM | POA: Insufficient documentation

## 2012-09-30 DIAGNOSIS — Z882 Allergy status to sulfonamides status: Secondary | ICD-10-CM | POA: Insufficient documentation

## 2012-09-30 DIAGNOSIS — I2510 Atherosclerotic heart disease of native coronary artery without angina pectoris: Secondary | ICD-10-CM | POA: Insufficient documentation

## 2012-09-30 HISTORY — PX: COLONOSCOPY: SHX5424

## 2012-09-30 HISTORY — DX: Gastro-esophageal reflux disease without esophagitis: K21.9

## 2012-09-30 SURGERY — COLONOSCOPY
Anesthesia: Moderate Sedation

## 2012-09-30 MED ORDER — SODIUM CHLORIDE 0.45 % IV SOLN
INTRAVENOUS | Status: DC
  Administered 2012-09-30: 1000 mL via INTRAVENOUS

## 2012-09-30 MED ORDER — MIDAZOLAM HCL 5 MG/5ML IJ SOLN
INTRAMUSCULAR | Status: DC | PRN
  Administered 2012-09-30: 2 mg via INTRAVENOUS

## 2012-09-30 MED ORDER — MEPERIDINE HCL 50 MG/ML IJ SOLN
INTRAMUSCULAR | Status: AC
  Filled 2012-09-30: qty 1

## 2012-09-30 MED ORDER — MIDAZOLAM HCL 5 MG/5ML IJ SOLN
INTRAMUSCULAR | Status: AC
  Filled 2012-09-30: qty 10

## 2012-09-30 MED ORDER — STERILE WATER FOR IRRIGATION IR SOLN
Status: DC | PRN
  Administered 2012-09-30: 09:00:00

## 2012-09-30 MED ORDER — MEPERIDINE HCL 50 MG/ML IJ SOLN
INTRAMUSCULAR | Status: DC | PRN
  Administered 2012-09-30: 10 mg via INTRAVENOUS
  Administered 2012-09-30: 15 mg via INTRAVENOUS

## 2012-09-30 NOTE — Op Note (Signed)
COLONOSCOPY PROCEDURE REPORT  PATIENT:  Tracy [name]  MR#:  [id number] Birthdate:  [birth year], 79 y.o., female Endoscopist:  Dr. Malissa Hippo, MD Referred By:  Dr. Alice Reichert, MD Procedure Date: 09/30/2012  Procedure:   Colonoscopy  Indications:  Patient is 79 year old Caucasian female who is here for surveillance colonoscopy. Last exam was over 3 years ago with removal of 14 small polyps and they're all tubular adenomas. Family history is negative for colorectal carcinoma her sister has had multiple polyps removed as well.  Informed Consent:  The procedure and risks were reviewed with the patient and informed consent was obtained.  Medications:  Demerol 25 mg IV Versed 2 mg IV  Description of procedure:  After a digital rectal exam was performed, that colonoscope was advanced from the anus through the rectum and colon to the area of the cecum, ileocecal valve and appendiceal orifice. The cecum was deeply intubated. These structures were well-seen and photographed for the record. From the level of the cecum and ileocecal valve, the scope was slowly and cautiously withdrawn. The mucosal surfaces were carefully surveyed utilizing scope tip to flexion to facilitate fold flattening as needed. The scope was pulled down into the rectum where a thorough exam including retroflexion was performed.  Findings:   Prep satisfactory except she had take stool according mucosa of the ascending colon and cecum. Cecal landmarks were well-seen after washing with water. Small cecal polyp of there are cold biopsy. Scattered diverticula throughout the colon but most of these are located at cecum, ascending and sigmoid colon. Normal rectal mucosa. Small hemorrhoids below the dentate line.   Therapeutic/Diagnostic Maneuvers Performed:  See above  Complications:  None  Cecal Withdrawal Time:  13 minutes  Impression:  Examination performed to cecum. Small cecal polyp ablated via cold  biopsy. Pan  colonic diverticulosis. Most of the diverticula are at cecum, ascending and sigmoid colon. Small external hemorrhoids.  Recommendations:  Standard instructions given. Patient will resume usual medications including warfarin. I will contact patient with biopsy results and further recommendations.  Sixto Bowdish U  09/30/2012 10:18 AM  CC: Dr. Alice Reichert, MD & Dr. Bonnetta Barry ref. provider found

## 2012-09-30 NOTE — H&P (Signed)
Tracy [name] is an 79 y.o. female.   Chief Complaint: Patient is here for colonoscopy. HPI: Patient is 79 year old Caucasian female who underwent first screening exam in December 2009 14 small polyps were removed and all of these were tubular adenomas. She is therefore returning for surveillance colonoscopy. She denies abdominal pain rectal bleeding or change in her bowel habits. Patient has decided not to have anymore colonoscopies after this exam. Family history is positive for multiple colonic polyps in her sister. Him history is negative for CRC.  Past Medical History  Diagnosis Date  . Hyperlipidemia   . Hypertension   . Coronary artery disease   . Venous insufficiency   . CHF (congestive heart failure)     acute diastolic heart failure  . Arrhythmia     chronic afib  . GERD (gastroesophageal reflux disease)     Past Surgical History  Procedure Laterality Date  . Back surgery    . Hernia repair      Family History  Problem Relation Age of Onset  . Colon cancer Neg Hx    Social History:  reports that she has never smoked. She does not have any smokeless tobacco history on file. She reports that she does not drink alcohol or use illicit drugs.  Allergies:  Allergies  Allergen Reactions  . Amoxicillin   . Penicillins   . Sulfonamide Derivatives     Medications Prior to Admission  Medication Sig Dispense Refill  . acetaminophen (TYLENOL) 500 MG tablet Take 500 mg by mouth every 6 (six) hours as needed for pain.      Marland Kitchen allopurinol (ZYLOPRIM) 100 MG tablet Take 100 mg by mouth daily.        Marland Kitchen aspirin 81 MG tablet Take 81 mg by mouth daily.        . calcium carbonate (OS-CAL) 600 MG TABS Take 600 mg by mouth daily.        Marland Kitchen diltiazem (CARDIZEM CD) 120 MG 24 hr capsule TAKE (1) CAPSULE DAILY  30 capsule  6  . LASIX 80 MG tablet TAKE 1 TABLET DAILY  30 each  5  . LOPRESSOR 50 MG tablet TAKE 1 TABLET EVERY MORNING AND 1/2 TABLET IN THE EVENING AS NEEDED  45 each  6  .  meclizine (ANTIVERT) 12.5 MG tablet Take 12.5 mg by mouth 3 (three) times daily as needed.      . Multiple Vitamins-Minerals (MULTIVITAMINS THER. W/MINERALS) TABS Take 1 tablet by mouth daily.      . niacin (NIASPAN) 500 MG CR tablet Take 500 mg by mouth at bedtime.        . rosuvastatin (CRESTOR) 20 MG tablet Take 20 mg by mouth daily.        Marland Kitchen VASOTEC 10 MG tablet TAKE 1 TABLET DAILY  30 each  6  . warfarin (COUMADIN) 5 MG tablet Take 5 mg by mouth daily. Take 5 mg (1 tablet) on Tuesday and Saturday Take 2.5 mg (1/2 tablet) Sunday, Monday, Wednesday, Thursday, and Friday.        No results found for this or any previous visit (from the past 48 hour(s)). No results found.  ROS  Blood pressure 129/59, pulse 80, temperature 97.7 F (36.5 C), temperature source Oral, resp. rate 17, height 5\' 3"  (1.6 m), weight 145 lb (65.772 kg), SpO2 96.00%. Physical Exam  Constitutional: She appears well-developed and well-nourished.  HENT:  Mouth/Throat: Oropharynx is clear and moist.  Eyes: Conjunctivae are normal. No scleral icterus.  Neck: No thyromegaly present.  Cardiovascular:  Irregular rhythm normal S1 and S2. Faint SEM at LLSB  Respiratory: Effort normal and breath sounds normal.  GI: Soft. She exhibits no distension and no mass. There is no tenderness.  Musculoskeletal: She exhibits no edema.  Lymphadenopathy:    She has no cervical adenopathy.  Neurological: She is alert.  Skin: Skin is warm and dry.     Assessment/Plan History of colonic adenomas. Surveillance colonoscopy.  REHMAN,NAJEEB U 09/30/2012, 9:33 AM

## 2012-09-30 NOTE — Discharge Instructions (Signed)
Resume usual medications including warfarin. High fiber diet. No driving for 24 hours. Physician will contact you with biopsy results.  Colonoscopy Care After Read the instructions outlined below and refer to this sheet in the next few weeks. These discharge instructions provide you with general information on caring for yourself after you leave the hospital. Your doctor may also give you specific instructions. While your treatment has been planned according to the most current medical practices available, unavoidable complications occasionally occur. If you have any problems or questions after discharge, call your doctor. HOME CARE INSTRUCTIONS ACTIVITY:  You may resume your regular activity, but move at a slower pace for the next 24 hours.  Take frequent rest periods for the next 24 hours.  Walking will help get rid of the air and reduce the bloated feeling in your belly (abdomen).  No driving for 24 hours (because of the medicine (anesthesia) used during the test).  You may shower.  Do not sign any important legal documents or operate any machinery for 24 hours (because of the anesthesia used during the test). NUTRITION:  Drink plenty of fluids.  You may resume your normal diet as instructed by your doctor.  Begin with a light meal and progress to your normal diet. Heavy or fried foods are harder to digest and may make you feel sick to your stomach (nauseated).  Avoid alcoholic beverages for 24 hours or as instructed. MEDICATIONS:  You may resume your normal medications unless your doctor tells you otherwise. WHAT TO EXPECT TODAY:  Some feelings of bloating in the abdomen.  Passage of more gas than usual.  Spotting of blood in your stool or on the toilet paper. IF YOU HAD POLYPS REMOVED DURING THE COLONOSCOPY:  No aspirin products for 7 days or as instructed.  No alcohol for 7 days or as instructed.  Eat a soft diet for the next 24 hours. FINDING OUT THE RESULTS OF  YOUR TEST Not all test results are available during your visit. If your test results are not back during the visit, make an appointment with your caregiver to find out the results. Do not assume everything is normal if you have not heard from your caregiver or the medical facility. It is important for you to follow up on all of your test results.  SEEK IMMEDIATE MEDICAL CARE IF:  You have more than a spotting of blood in your stool.  Your belly is swollen (abdominal distention).  You are nauseated or vomiting.  You have a fever.  You have abdominal pain or discomfort that is severe or gets worse throughout the day. Document Released: 02/19/2004 Document Revised: 09/29/2011 Document Reviewed: 02/17/2008 [facility] Patient Information 2013 [place], [place].   Colon Polyps Polyps are lumps of extra tissue growing inside the body. Polyps can grow in the large intestine (colon). Most colon polyps are noncancerous (benign). However, some colon polyps can become cancerous over time. Polyps that are larger than a pea may be harmful. To be safe, caregivers remove and test all polyps. CAUSES  Polyps form when mutations in the genes cause your cells to grow and divide even though no more tissue is needed. RISK FACTORS There are a number of risk factors that can increase your chances of getting colon polyps. They include:  Being older than 50 years.  Family history of colon polyps or colon cancer.  Long-term colon diseases, such as colitis or Crohn disease.  Being overweight.  Smoking.  Being inactive.  Drinking too much alcohol.  SYMPTOMS  Most small polyps do not cause symptoms. If symptoms are present, they may include:  Blood in the stool. The stool may look dark red or black.  Constipation or diarrhea that lasts longer than 1 week. DIAGNOSIS People often do not know they have polyps until their caregiver finds them during a regular checkup. Your caregiver can use 4 tests to check for  polyps:  Digital rectal exam. The caregiver wears gloves and feels inside the rectum. This test would find polyps only in the rectum.  Barium enema. The caregiver puts a liquid called barium into your rectum before taking X-rays of your colon. Barium makes your colon look white. Polyps are dark, so they are easy to see in the X-ray pictures.  Sigmoidoscopy. A thin, flexible tube (sigmoidoscope) is placed into your rectum. The sigmoidoscope has a light and tiny camera in it. The caregiver uses the sigmoidoscope to look at the last third of your colon.  Colonoscopy. This test is like sigmoidoscopy, but the caregiver looks at the entire colon. This is the most common method for finding and removing polyps. TREATMENT  Any polyps will be removed during a sigmoidoscopy or colonoscopy. The polyps are then tested for cancer. PREVENTION  To help lower your risk of getting more colon polyps:  Eat plenty of fruits and vegetables. Avoid eating fatty foods.  Do not smoke.  Avoid drinking alcohol.  Exercise every day.  Lose weight if recommended by your caregiver.  Eat plenty of calcium and folate. Foods that are rich in calcium include milk, cheese, and broccoli. Foods that are rich in folate include chickpeas, kidney beans, and spinach. HOME CARE INSTRUCTIONS Keep all follow-up appointments as directed by your caregiver. You may need periodic exams to check for polyps. SEEK MEDICAL CARE IF: You notice bleeding during a bowel movement. Document Released: 04/02/2004 Document Revised: 09/29/2011 Document Reviewed: 09/16/2011 [facility] Patient Information 2013 [place], [place].

## 2012-10-05 ENCOUNTER — Encounter (INDEPENDENT_AMBULATORY_CARE_PROVIDER_SITE_OTHER): Payer: Self-pay | Admitting: Internal Medicine

## 2012-10-05 ENCOUNTER — Encounter (HOSPITAL_COMMUNITY): Payer: Self-pay | Admitting: Internal Medicine

## 2012-10-06 ENCOUNTER — Telehealth (INDEPENDENT_AMBULATORY_CARE_PROVIDER_SITE_OTHER): Payer: Self-pay | Admitting: *Deleted

## 2012-10-06 DIAGNOSIS — K635 Polyp of colon: Secondary | ICD-10-CM

## 2012-10-06 DIAGNOSIS — K5730 Diverticulosis of large intestine without perforation or abscess without bleeding: Secondary | ICD-10-CM

## 2012-10-06 NOTE — Addendum Note (Signed)
Addended by: Shona Needles on: 10/06/2012 11:00 AM   Modules accepted: Orders

## 2012-10-06 NOTE — Progress Notes (Signed)
Per Dr. Karilyn Cota, no office visit needed.

## 2012-10-06 NOTE — Progress Notes (Signed)
Per Dr. Karilyn Cota no office visit needed.

## 2012-10-06 NOTE — Telephone Encounter (Addendum)
Per Dr.Rehman the patient will need to have the above labs prior to office visit in 2  Months Dr.Rehman called the office and stated that patient did not need the above test, labs have been canceled

## 2012-10-08 ENCOUNTER — Other Ambulatory Visit: Payer: Self-pay | Admitting: Cardiovascular Disease

## 2012-10-11 ENCOUNTER — Ambulatory Visit (HOSPITAL_COMMUNITY)
Admission: RE | Admit: 2012-10-11 | Discharge: 2012-10-11 | Disposition: A | Discharge status: Home / Self Care | Payer: Medicare Other | Source: Ambulatory Visit | Attending: Family Medicine | Admitting: Family Medicine

## 2012-10-11 ENCOUNTER — Other Ambulatory Visit (HOSPITAL_COMMUNITY): Payer: Self-pay | Admitting: Family Medicine

## 2012-10-11 DIAGNOSIS — M542 Cervicalgia: Secondary | ICD-10-CM

## 2012-10-11 DIAGNOSIS — M25519 Pain in unspecified shoulder: Secondary | ICD-10-CM | POA: Insufficient documentation

## 2012-10-11 DIAGNOSIS — M25611 Stiffness of right shoulder, not elsewhere classified: Secondary | ICD-10-CM

## 2012-10-11 DIAGNOSIS — M5030 Other cervical disc degeneration, unspecified cervical region: Secondary | ICD-10-CM | POA: Insufficient documentation

## 2012-10-18 ENCOUNTER — Other Ambulatory Visit: Payer: Self-pay | Admitting: Cardiovascular Disease

## 2012-10-29 ENCOUNTER — Ambulatory Visit (INDEPENDENT_AMBULATORY_CARE_PROVIDER_SITE_OTHER): Payer: Medicare Other | Admitting: *Deleted

## 2012-10-29 DIAGNOSIS — I4891 Unspecified atrial fibrillation: Secondary | ICD-10-CM

## 2012-10-29 DIAGNOSIS — Z7901 Long term (current) use of anticoagulants: Secondary | ICD-10-CM

## 2012-11-04 ENCOUNTER — Encounter (INDEPENDENT_AMBULATORY_CARE_PROVIDER_SITE_OTHER): Payer: Self-pay | Admitting: *Deleted

## 2012-11-04 ENCOUNTER — Telehealth (INDEPENDENT_AMBULATORY_CARE_PROVIDER_SITE_OTHER): Payer: Self-pay | Admitting: *Deleted

## 2012-11-04 DIAGNOSIS — D649 Anemia, unspecified: Secondary | ICD-10-CM

## 2012-11-04 DIAGNOSIS — I25810 Atherosclerosis of coronary artery bypass graft(s) without angina pectoris: Secondary | ICD-10-CM

## 2012-11-04 NOTE — Telephone Encounter (Signed)
Labs per Terri Setzer,NP 

## 2012-12-14 ENCOUNTER — Other Ambulatory Visit: Payer: Self-pay | Admitting: *Deleted

## 2012-12-14 ENCOUNTER — Other Ambulatory Visit: Payer: Self-pay | Admitting: Cardiovascular Disease

## 2012-12-31 ENCOUNTER — Ambulatory Visit (INDEPENDENT_AMBULATORY_CARE_PROVIDER_SITE_OTHER): Payer: Medicare Other | Admitting: *Deleted

## 2012-12-31 DIAGNOSIS — Z7901 Long term (current) use of anticoagulants: Secondary | ICD-10-CM

## 2012-12-31 DIAGNOSIS — I4891 Unspecified atrial fibrillation: Secondary | ICD-10-CM

## 2012-12-31 LAB — POCT INR: INR: 2.6

## 2013-01-14 ENCOUNTER — Other Ambulatory Visit: Payer: Self-pay | Admitting: Cardiovascular Disease

## 2013-01-26 ENCOUNTER — Other Ambulatory Visit: Payer: Self-pay | Admitting: Cardiovascular Disease

## 2013-02-11 ENCOUNTER — Ambulatory Visit (INDEPENDENT_AMBULATORY_CARE_PROVIDER_SITE_OTHER): Payer: Medicare Other | Admitting: *Deleted

## 2013-02-11 DIAGNOSIS — I4891 Unspecified atrial fibrillation: Secondary | ICD-10-CM

## 2013-02-11 DIAGNOSIS — Z7901 Long term (current) use of anticoagulants: Secondary | ICD-10-CM

## 2013-02-11 LAB — POCT INR: INR: 2.8

## 2013-03-24 ENCOUNTER — Ambulatory Visit (INDEPENDENT_AMBULATORY_CARE_PROVIDER_SITE_OTHER): Payer: Medicare Other | Admitting: Cardiovascular Disease

## 2013-03-24 ENCOUNTER — Ambulatory Visit (INDEPENDENT_AMBULATORY_CARE_PROVIDER_SITE_OTHER): Payer: Medicare Other | Admitting: *Deleted

## 2013-03-24 ENCOUNTER — Encounter: Payer: Self-pay | Admitting: Cardiovascular Disease

## 2013-03-24 VITALS — BP 129/70 | HR 66 | Resp 12 | Ht 63.0 in | Wt 138.0 lb

## 2013-03-24 DIAGNOSIS — I4891 Unspecified atrial fibrillation: Secondary | ICD-10-CM

## 2013-03-24 DIAGNOSIS — R42 Dizziness and giddiness: Secondary | ICD-10-CM

## 2013-03-24 DIAGNOSIS — Z7901 Long term (current) use of anticoagulants: Secondary | ICD-10-CM

## 2013-03-24 DIAGNOSIS — I2510 Atherosclerotic heart disease of native coronary artery without angina pectoris: Secondary | ICD-10-CM

## 2013-03-24 LAB — POCT INR: INR: 4.3

## 2013-03-24 NOTE — Progress Notes (Signed)
History of Present Illness: [name] with history of CAD, chronic atrial fibrillation managed with rate control medications and Coumadin, diastolic CHF, HTN and hyperlipidemia here today for planned cardiac follow up. She's had non-obstructive CAD by catheterization in 2003 with 40% mid LAD, plaque disease in other vessels. Echo August 2013 normal LV function, mild MR, Mild AI, mild AS, mild TR.   She is here today for follow up. She is on chronic coumadin therapy and is followed in our coumadin clinic. No bleeding issues. No exertional chest pain. Breathing ok. Continued dizziness. She lost her daughter 3 months ago and she is still sad about this.   Primary Care Physician: Tracy [name]  Last Lipid Profile:Lipid Panel     Component Value Date/Time   CHOL 136 09/17/2012 1226   TRIG 69.0 09/17/2012 1226   HDL 65.40 09/17/2012 1226   CHOLHDL 2 09/17/2012 1226   VLDL 13.8 09/17/2012 1226   LDLCALC 57 09/17/2012 1226    Past Medical History  Diagnosis Date  . Hyperlipidemia   . Hypertension   . Coronary artery disease   . Venous insufficiency   . CHF (congestive heart failure)     acute diastolic heart failure  . Arrhythmia     chronic afib  . GERD (gastroesophageal reflux disease)     Past Surgical History  Procedure Laterality Date  . Back surgery    . Hernia repair    . Colonoscopy N/A 09/30/2012    Procedure: COLONOSCOPY;  Surgeon: Malissa Hippo, MD;  Location: [facility];  Service: Endoscopy;  Laterality: N/A;  930    Current Outpatient Prescriptions  Medication Sig Dispense Refill  . acetaminophen (TYLENOL) 500 MG tablet Take 500 mg by mouth every 6 (six) hours as needed for pain.      Marland Kitchen allopurinol (ZYLOPRIM) 100 MG tablet Take 100 mg by mouth daily.        Marland Kitchen aspirin 81 MG tablet Take 81 mg by mouth daily.        . calcium carbonate (OS-CAL) 600 MG TABS Take 600 mg by mouth daily.        Marland Kitchen diltiazem (CARDIZEM CD) 120 MG 24 hr capsule TAKE (1) CAPSULE DAILY  30  capsule  3  . enalapril (VASOTEC) 10 MG tablet TAKE 1 TABLET DAILY  30 tablet  6  . furosemide (LASIX) 80 MG tablet TAKE 1 TABLET DAILY  30 tablet  6  . meclizine (ANTIVERT) 12.5 MG tablet Take 12.5 mg by mouth 3 (three) times daily as needed.      . metoprolol (LOPRESSOR) 50 MG tablet TAKE 1 TABLET EVERY MORNING AND 1/2 TABLET IN THE EVENING AS NEEDED  45 tablet  3  . Multiple Vitamins-Minerals (MULTIVITAMINS THER. W/MINERALS) TABS Take 1 tablet by mouth daily.      . niacin (NIASPAN) 500 MG CR tablet Take 500 mg by mouth at bedtime.        . rosuvastatin (CRESTOR) 20 MG tablet Take 20 mg by mouth daily.        Marland Kitchen warfarin (COUMADIN) 5 MG tablet Take as directed by coumadin clinic  30 tablet  3   No current facility-administered medications for this visit.    Allergies  Allergen Reactions  . Amoxicillin   . Penicillins   . Sulfonamide Derivatives     History   Social History  . Marital Status: Married    Spouse Name: N/A    Number of Children: N/A  .  Years of Education: N/A   Occupational History  . retired    Social History Main Topics  . Smoking status: Never Smoker   . Smokeless tobacco: Not on file  . Alcohol Use: No  . Drug Use: No  . Sexual Activity: Not on file   Other Topics Concern  . Not on file   Social History Narrative  . No narrative on file    Family History  Problem Relation Age of Onset  . Colon cancer Neg Hx   . Colon polyps Sister     Review of Systems:  As stated in the HPI and otherwise negative.   BP 129/70  Pulse 66  Resp 12  Ht 5\' 3"  (1.6 m)  Wt 138 lb (62.596 kg)  BMI 24.45 kg/m2  Physical Examination: General: Well developed, well nourished, NAD HEENT: OP clear, mucus membranes moist SKIN: warm, dry. No rashes. Neuro: No focal deficits Musculoskeletal: Muscle strength 5/5 all ext Psychiatric: Mood and affect normal Neck: No JVD, no carotid bruits, no thyromegaly, no lymphadenopathy. Lungs:Clear bilaterally, no wheezes,  rhonci, crackles Cardiovascular: Irregular irregular. No murmurs, gallops or rubs. Abdomen:Soft. Bowel sounds present. Non-tender.  Extremities: No lower extremity edema. Pulses are 2 + in the bilateral DP/PT.  EKG: Atrial fibrillation, rate 66 bpm. PVCs  Echo 03/19/12:  Left ventricle: The cavity size was normal. Wall thickness was normal. Systolic function was normal. The estimated ejection fraction was in the range of 55% to 60%. - Aortic valve: Calcified non coronary cusp There was mild stenosis. Mild regurgitation. Mean gradient: 13mm Hg (S). Peak gradient: 23mm Hg (S). - Mitral valve: Mild regurgitation. - Left atrium: The atrium was severely dilated. - Right atrium: The atrium was moderately dilated. - Atrial septum: No defect or patent foramen ovale was identified. - Tricuspid valve: Moderate regurgitation. - Pulmonary arteries: PA peak pressure: 40mm Hg (S).  Assessment and Plan:   1. Atrial fibrillation: Rate controlled. Continue Cardizem for rate control. Continue coumadin for anti-coagulation.   2. CAD: Mild by cath 2003. She has had no exertional chest pain. Continue current therapy.   3. Mitral regurgitation: Mild by echo August 2013.   4. Aortic stenosis: Mild by echo August 2013. Will repeat echo spring 2015.   5. Dizziness: Will plan carotid artery dopplers to exclude obstructive disease. Dizziness does not seem cardiac related.

## 2013-03-24 NOTE — Patient Instructions (Addendum)
Your physician wants you to follow-up in:  6 months.You will receive a reminder letter in the mail two months in advance. If you don't receive a letter, please call our office to schedule the follow-up appointment. Fasting lab work will be done at this appt.   Your physician has requested that you have a carotid duplex. This test is an ultrasound of the carotid arteries in your neck. It looks at blood flow through these arteries that supply the brain with blood. Allow one hour for this exam. There are no restrictions or special instructions.

## 2013-04-08 ENCOUNTER — Encounter (INDEPENDENT_AMBULATORY_CARE_PROVIDER_SITE_OTHER): Payer: Medicare Other

## 2013-04-08 ENCOUNTER — Ambulatory Visit (INDEPENDENT_AMBULATORY_CARE_PROVIDER_SITE_OTHER): Payer: Medicare Other | Admitting: *Deleted

## 2013-04-08 DIAGNOSIS — I4891 Unspecified atrial fibrillation: Secondary | ICD-10-CM

## 2013-04-08 DIAGNOSIS — Z7901 Long term (current) use of anticoagulants: Secondary | ICD-10-CM

## 2013-04-08 DIAGNOSIS — R42 Dizziness and giddiness: Secondary | ICD-10-CM

## 2013-04-11 ENCOUNTER — Other Ambulatory Visit (HOSPITAL_COMMUNITY): Payer: Self-pay | Admitting: Family Medicine

## 2013-04-11 DIAGNOSIS — Z139 Encounter for screening, unspecified: Secondary | ICD-10-CM

## 2013-04-22 ENCOUNTER — Ambulatory Visit (INDEPENDENT_AMBULATORY_CARE_PROVIDER_SITE_OTHER): Payer: Medicare Other | Admitting: *Deleted

## 2013-04-22 DIAGNOSIS — Z7901 Long term (current) use of anticoagulants: Secondary | ICD-10-CM

## 2013-04-22 DIAGNOSIS — I4891 Unspecified atrial fibrillation: Secondary | ICD-10-CM

## 2013-05-06 ENCOUNTER — Ambulatory Visit (INDEPENDENT_AMBULATORY_CARE_PROVIDER_SITE_OTHER): Payer: Medicare Other | Admitting: *Deleted

## 2013-05-06 ENCOUNTER — Ambulatory Visit (HOSPITAL_COMMUNITY)
Admission: RE | Admit: 2013-05-06 | Discharge: 2013-05-06 | Disposition: A | Discharge status: Home / Self Care | Payer: Medicare Other | Source: Ambulatory Visit | Attending: Family Medicine | Admitting: Family Medicine

## 2013-05-06 DIAGNOSIS — I4891 Unspecified atrial fibrillation: Secondary | ICD-10-CM

## 2013-05-06 DIAGNOSIS — Z7901 Long term (current) use of anticoagulants: Secondary | ICD-10-CM

## 2013-05-06 DIAGNOSIS — Z139 Encounter for screening, unspecified: Secondary | ICD-10-CM

## 2013-05-06 DIAGNOSIS — Z1231 Encounter for screening mammogram for malignant neoplasm of breast: Secondary | ICD-10-CM | POA: Insufficient documentation

## 2013-05-06 LAB — POCT INR: INR: 1.8

## 2013-05-17 ENCOUNTER — Other Ambulatory Visit: Payer: Self-pay | Admitting: Cardiovascular Disease

## 2013-05-20 ENCOUNTER — Ambulatory Visit (INDEPENDENT_AMBULATORY_CARE_PROVIDER_SITE_OTHER): Payer: Medicare Other | Admitting: General Practice

## 2013-05-20 DIAGNOSIS — Z7901 Long term (current) use of anticoagulants: Secondary | ICD-10-CM

## 2013-05-20 DIAGNOSIS — I4891 Unspecified atrial fibrillation: Secondary | ICD-10-CM

## 2013-06-10 ENCOUNTER — Ambulatory Visit (INDEPENDENT_AMBULATORY_CARE_PROVIDER_SITE_OTHER): Payer: Medicare Other | Admitting: General Practice

## 2013-06-10 DIAGNOSIS — Z7901 Long term (current) use of anticoagulants: Secondary | ICD-10-CM

## 2013-06-10 DIAGNOSIS — I4891 Unspecified atrial fibrillation: Secondary | ICD-10-CM

## 2013-06-30 ENCOUNTER — Other Ambulatory Visit: Payer: Self-pay | Admitting: Cardiovascular Disease

## 2013-07-01 ENCOUNTER — Ambulatory Visit (INDEPENDENT_AMBULATORY_CARE_PROVIDER_SITE_OTHER): Payer: Medicare Other | Admitting: General Practice

## 2013-07-01 DIAGNOSIS — I4891 Unspecified atrial fibrillation: Secondary | ICD-10-CM

## 2013-07-01 DIAGNOSIS — Z7901 Long term (current) use of anticoagulants: Secondary | ICD-10-CM

## 2013-07-01 LAB — POCT INR: INR: 3.8

## 2013-07-22 ENCOUNTER — Ambulatory Visit (INDEPENDENT_AMBULATORY_CARE_PROVIDER_SITE_OTHER): Payer: Medicare Other | Admitting: Pharmacist

## 2013-07-22 DIAGNOSIS — I4891 Unspecified atrial fibrillation: Secondary | ICD-10-CM

## 2013-07-22 DIAGNOSIS — Z7901 Long term (current) use of anticoagulants: Secondary | ICD-10-CM

## 2013-07-22 LAB — POCT INR: INR: 3.2

## 2013-08-12 ENCOUNTER — Ambulatory Visit (INDEPENDENT_AMBULATORY_CARE_PROVIDER_SITE_OTHER): Payer: Medicare Other

## 2013-08-12 DIAGNOSIS — Z7901 Long term (current) use of anticoagulants: Secondary | ICD-10-CM

## 2013-08-12 DIAGNOSIS — Z5181 Encounter for therapeutic drug level monitoring: Secondary | ICD-10-CM | POA: Insufficient documentation

## 2013-08-12 DIAGNOSIS — I4891 Unspecified atrial fibrillation: Secondary | ICD-10-CM

## 2013-08-12 LAB — POCT INR: INR: 2.7

## 2013-08-15 ENCOUNTER — Other Ambulatory Visit: Payer: Self-pay | Admitting: Cardiovascular Disease

## 2013-08-23 ENCOUNTER — Other Ambulatory Visit: Payer: Self-pay | Admitting: Cardiovascular Disease

## 2013-09-23 ENCOUNTER — Ambulatory Visit: Payer: Medicare Other | Admitting: Cardiovascular Disease

## 2013-09-26 ENCOUNTER — Ambulatory Visit (INDEPENDENT_AMBULATORY_CARE_PROVIDER_SITE_OTHER): Payer: Medicare Other

## 2013-09-26 DIAGNOSIS — Z7901 Long term (current) use of anticoagulants: Secondary | ICD-10-CM

## 2013-09-26 DIAGNOSIS — Z5181 Encounter for therapeutic drug level monitoring: Secondary | ICD-10-CM

## 2013-09-26 DIAGNOSIS — I4891 Unspecified atrial fibrillation: Secondary | ICD-10-CM

## 2013-09-26 LAB — POCT INR: INR: 1.6

## 2013-10-14 ENCOUNTER — Ambulatory Visit (INDEPENDENT_AMBULATORY_CARE_PROVIDER_SITE_OTHER): Payer: Medicare Other | Admitting: Pharmacist

## 2013-10-14 DIAGNOSIS — I4891 Unspecified atrial fibrillation: Secondary | ICD-10-CM

## 2013-10-14 DIAGNOSIS — Z5181 Encounter for therapeutic drug level monitoring: Secondary | ICD-10-CM

## 2013-10-14 DIAGNOSIS — Z7901 Long term (current) use of anticoagulants: Secondary | ICD-10-CM

## 2013-10-14 LAB — POCT INR: INR: 2.2

## 2013-11-04 ENCOUNTER — Other Ambulatory Visit: Payer: Self-pay | Admitting: *Deleted

## 2013-11-04 ENCOUNTER — Ambulatory Visit (INDEPENDENT_AMBULATORY_CARE_PROVIDER_SITE_OTHER): Payer: Medicare Other | Admitting: Cardiovascular Disease

## 2013-11-04 ENCOUNTER — Ambulatory Visit (INDEPENDENT_AMBULATORY_CARE_PROVIDER_SITE_OTHER): Payer: Medicare Other | Admitting: *Deleted

## 2013-11-04 ENCOUNTER — Encounter: Payer: Self-pay | Admitting: Cardiovascular Disease

## 2013-11-04 VITALS — BP 102/60 | HR 85 | Ht 62.0 in | Wt 140.0 lb

## 2013-11-04 DIAGNOSIS — I4891 Unspecified atrial fibrillation: Secondary | ICD-10-CM

## 2013-11-04 DIAGNOSIS — I359 Nonrheumatic aortic valve disorder, unspecified: Secondary | ICD-10-CM

## 2013-11-04 DIAGNOSIS — I2510 Atherosclerotic heart disease of native coronary artery without angina pectoris: Secondary | ICD-10-CM

## 2013-11-04 DIAGNOSIS — Z7901 Long term (current) use of anticoagulants: Secondary | ICD-10-CM

## 2013-11-04 DIAGNOSIS — I080 Rheumatic disorders of both mitral and aortic valves: Secondary | ICD-10-CM

## 2013-11-04 DIAGNOSIS — I350 Nonrheumatic aortic (valve) stenosis: Secondary | ICD-10-CM

## 2013-11-04 DIAGNOSIS — Z5181 Encounter for therapeutic drug level monitoring: Secondary | ICD-10-CM

## 2013-11-04 DIAGNOSIS — E785 Hyperlipidemia, unspecified: Secondary | ICD-10-CM

## 2013-11-04 LAB — HEPATIC FUNCTION PANEL
ALT: 15 U/L (ref 0–35)
AST: 22 U/L (ref 0–37)
Albumin: 3.7 g/dL (ref 3.5–5.2)
Alkaline Phosphatase: 59 U/L (ref 39–117)
BILIRUBIN TOTAL: 0.7 mg/dL (ref 0.3–1.2)
Bilirubin, Direct: 0 mg/dL (ref 0.0–0.3)
Total Protein: 6.4 g/dL (ref 6.0–8.3)

## 2013-11-04 LAB — POCT INR: INR: 1.9

## 2013-11-04 LAB — LIPID PANEL
Cholesterol: 141 mg/dL (ref 0–200)
HDL: 67.4 mg/dL (ref >39.00)
LDL Cholesterol: 55 mg/dL (ref 0–99)
Total CHOL/HDL Ratio: 2
Triglycerides: 93 mg/dL (ref 0.0–149.0)
VLDL: 18.6 mg/dL (ref 0.0–40.0)

## 2013-11-04 LAB — BASIC METABOLIC PANEL
BUN: 63 mg/dL — AB (ref 6–23)
CHLORIDE: 102 meq/L (ref 96–112)
CO2: 27 mEq/L (ref 19–32)
Calcium: 9.7 mg/dL (ref 8.4–10.5)
Creatinine, Ser: 2.5 mg/dL — ABNORMAL HIGH (ref 0.4–1.2)
GFR: 19.44 mL/min — ABNORMAL LOW (ref >60.00)
GLUCOSE: 119 mg/dL — AB (ref 70–99)
POTASSIUM: 4.5 meq/L (ref 3.5–5.1)
Sodium: 139 mEq/L (ref 135–145)

## 2013-11-04 LAB — TSH: TSH: 1.37 u[IU]/mL (ref 0.35–5.50)

## 2013-11-04 NOTE — Patient Instructions (Signed)
Your physician wants you to follow-up in:  6 months. You will receive a reminder letter in the mail two months in advance. If you don't receive a letter, please call our office to schedule the follow-up appointment.  Your physician has recommended you make the following change in your medication: Stop Niacin.  Stop allopurinol

## 2013-11-04 NOTE — Progress Notes (Signed)
History of Present Illness: 80 yo WF with history of CAD, chronic atrial fibrillation managed with rate control medications and Coumadin, diastolic CHF, HTN and hyperlipidemia here today for planned cardiac follow up. She's had non-obstructive CAD by catheterization in 2003 with 40% mid LAD, plaque disease in other vessels. Echo August 2013 normal LV function, mild MR, Mild AI, mild AS, mild TR. Mild carotid disease by dopplers September 2014.   She is here today for follow up. She is on chronic coumadin therapy and is followed in our coumadin clinic. No bleeding issues. No exertional chest pain. Breathing ok. No dizziness. She lost her daughter and husband in the last year. She is still sad about this.   Primary Care Physician: Marjean Donna  Last Lipid Profile:Lipid Panel     Component Value Date/Time   CHOL 136 09/17/2012 1226   TRIG 69.0 09/17/2012 1226   HDL 65.40 09/17/2012 1226   CHOLHDL 2 09/17/2012 1226   VLDL 13.8 09/17/2012 1226   LDLCALC 57 09/17/2012 1226    Past Medical History  Diagnosis Date  . Hyperlipidemia   . Hypertension   . Coronary artery disease   . Venous insufficiency   . CHF (congestive heart failure)     acute diastolic heart failure  . Arrhythmia     chronic afib  . GERD (gastroesophageal reflux disease)     Past Surgical History  Procedure Laterality Date  . Back surgery    . Hernia repair    . Colonoscopy N/A 09/30/2012    Procedure: COLONOSCOPY;  Surgeon: Rogene Houston, MD;  Location: [facility];  Service: Endoscopy;  Laterality: N/A;  930    Current Outpatient Prescriptions  Medication Sig Dispense Refill  . acetaminophen (TYLENOL) 500 MG tablet Take 500 mg by mouth every 6 (six) hours as needed for pain.      Marland Kitchen allopurinol (ZYLOPRIM) 100 MG tablet Take 100 mg by mouth daily.       Marland Kitchen aspirin 81 MG tablet Take 81 mg by mouth daily.        . calcium carbonate (OS-CAL) 600 MG TABS Take 600 mg by mouth daily.        Marland Kitchen diltiazem (CARDIZEM  CD) 120 MG 24 hr capsule TAKE (1) CAPSULE DAILY  30 capsule  5  . enalapril (VASOTEC) 10 MG tablet TAKE 1 TABLET DAILY  30 tablet  6  . furosemide (LASIX) 80 MG tablet TAKE 1 TABLET DAILY  30 tablet  6  . meclizine (ANTIVERT) 12.5 MG tablet Take 12.5 mg by mouth 3 (three) times daily as needed.      . metoprolol (LOPRESSOR) 50 MG tablet TAKE 1 TAB EVERY MORNING AND 1/2 TAB IN THE EVENING AS NEEDED  45 tablet  2  . Multiple Vitamins-Minerals (MULTIVITAMINS THER. W/MINERALS) TABS Take 1 tablet by mouth daily.      . niacin (NIASPAN) 500 MG CR tablet Take 500 mg by mouth at bedtime.       . rosuvastatin (CRESTOR) 20 MG tablet Take 20 mg by mouth daily.        Marland Kitchen warfarin (COUMADIN) 5 MG tablet TAKE AS DIRECTED.  30 tablet  3   No current facility-administered medications for this visit.    Allergies  Allergen Reactions  . Amoxicillin   . Penicillins   . Sulfonamide Derivatives     History   Social History  . Marital Status: Married    Spouse Name: N/A    Number  of Children: N/A  . Years of Education: N/A   Occupational History  . retired    Social History Main Topics  . Smoking status: Never Smoker   . Smokeless tobacco: Not on file  . Alcohol Use: No  . Drug Use: No  . Sexual Activity: Not on file   Other Topics Concern  . Not on file   Social History Narrative  . No narrative on file    Family History  Problem Relation Age of Onset  . Colon cancer Neg Hx   . Colon polyps Sister     Review of Systems:  As stated in the HPI and otherwise negative.   BP 102/60  Pulse 85  Ht 5\' 2"  (1.575 m)  Wt 140 lb (63.504 kg)  BMI 25.60 kg/m2  SpO2 99%  Physical Examination: General: Well developed, well nourished, NAD HEENT: OP clear, mucus membranes moist SKIN: warm, dry. No rashes. Neuro: No focal deficits Musculoskeletal: Muscle strength 5/5 all ext Psychiatric: Mood and affect normal Neck: No JVD, no carotid bruits, no thyromegaly, no lymphadenopathy. Lungs:Clear  bilaterally, no wheezes, rhonci, crackles Cardiovascular: Irregular irregular. No murmurs, gallops or rubs. Abdomen:Soft. Bowel sounds present. Non-tender.  Extremities: No lower extremity edema. Pulses are 2 + in the bilateral DP/PT.  Echo 03/19/12:  Left ventricle: The cavity size was normal. Wall thickness was normal. Systolic function was normal. The estimated ejection fraction was in the range of 55% to 60%. - Aortic valve: Calcified non coronary cusp There was mild stenosis. Mild regurgitation. Mean gradient: 13mm Hg (S). Peak gradient: 85mm Hg (S). - Mitral valve: Mild regurgitation. - Left atrium: The atrium was severely dilated. - Right atrium: The atrium was moderately dilated. - Atrial septum: No defect or patent foramen ovale was identified. - Tricuspid valve: Moderate regurgitation. - Pulmonary arteries: PA peak pressure: 48mm Hg (S).  Assessment and Plan:   1. Atrial fibrillation: Rate controlled. Continue Cardizem for rate control. Continue coumadin for anti-coagulation. Check TSH and BMET  2. CAD: Mild by cath 2003. She has had no exertional chest pain. Continue current therapy.   3. Mitral regurgitation: Mild by echo August 2013.   4. Aortic stenosis: Mild by echo August 2013. Will repeat echo Fall 2015.   5. Dizziness: Resolved. Carotid dopplers with mild disease.    6. Hyperlipidemia: Will check lipids and LFTs today. Continue statin. Stop Niaspan.

## 2013-11-07 ENCOUNTER — Other Ambulatory Visit: Payer: Self-pay | Admitting: *Deleted

## 2013-11-07 ENCOUNTER — Other Ambulatory Visit (INDEPENDENT_AMBULATORY_CARE_PROVIDER_SITE_OTHER): Payer: Medicare Other

## 2013-11-07 DIAGNOSIS — I2510 Atherosclerotic heart disease of native coronary artery without angina pectoris: Secondary | ICD-10-CM

## 2013-11-07 LAB — BASIC METABOLIC PANEL
BUN: 54 mg/dL — AB (ref 6–23)
CHLORIDE: 104 meq/L (ref 96–112)
CO2: 27 mEq/L (ref 19–32)
CREATININE: 2.5 mg/dL — AB (ref 0.4–1.2)
Calcium: 9.6 mg/dL (ref 8.4–10.5)
GFR: 19.53 mL/min — ABNORMAL LOW (ref >60.00)
Glucose, Bld: 107 mg/dL — ABNORMAL HIGH (ref 70–99)
Potassium: 4.7 mEq/L (ref 3.5–5.1)
Sodium: 138 mEq/L (ref 135–145)

## 2013-11-14 ENCOUNTER — Other Ambulatory Visit: Payer: Self-pay | Admitting: Cardiovascular Disease

## 2013-11-16 ENCOUNTER — Other Ambulatory Visit (INDEPENDENT_AMBULATORY_CARE_PROVIDER_SITE_OTHER): Payer: Medicare Other

## 2013-11-16 ENCOUNTER — Other Ambulatory Visit: Payer: Self-pay | Admitting: *Deleted

## 2013-11-16 DIAGNOSIS — I2510 Atherosclerotic heart disease of native coronary artery without angina pectoris: Secondary | ICD-10-CM

## 2013-11-16 LAB — BASIC METABOLIC PANEL
BUN: 36 mg/dL — AB (ref 6–23)
CO2: 26 mEq/L (ref 19–32)
Calcium: 9.4 mg/dL (ref 8.4–10.5)
Chloride: 105 mEq/L (ref 96–112)
Creatinine, Ser: 1.5 mg/dL — ABNORMAL HIGH (ref 0.4–1.2)
GFR: 34.95 mL/min — AB (ref >60.00)
GLUCOSE: 86 mg/dL (ref 70–99)
POTASSIUM: 4.4 meq/L (ref 3.5–5.1)
Sodium: 136 mEq/L (ref 135–145)

## 2013-11-16 IMAGING — CR DG SHOULDER 2+V*R*
3 series · 3 of 3 positions shown · non-contrast
Comparison: Chest radiograph 08/08/2008

CLINICAL DATA: Chronic shoulder pain and limited motion.

RIGHT SHOULDER - 2+ VIEW

[view not recorded (1 of 3)]
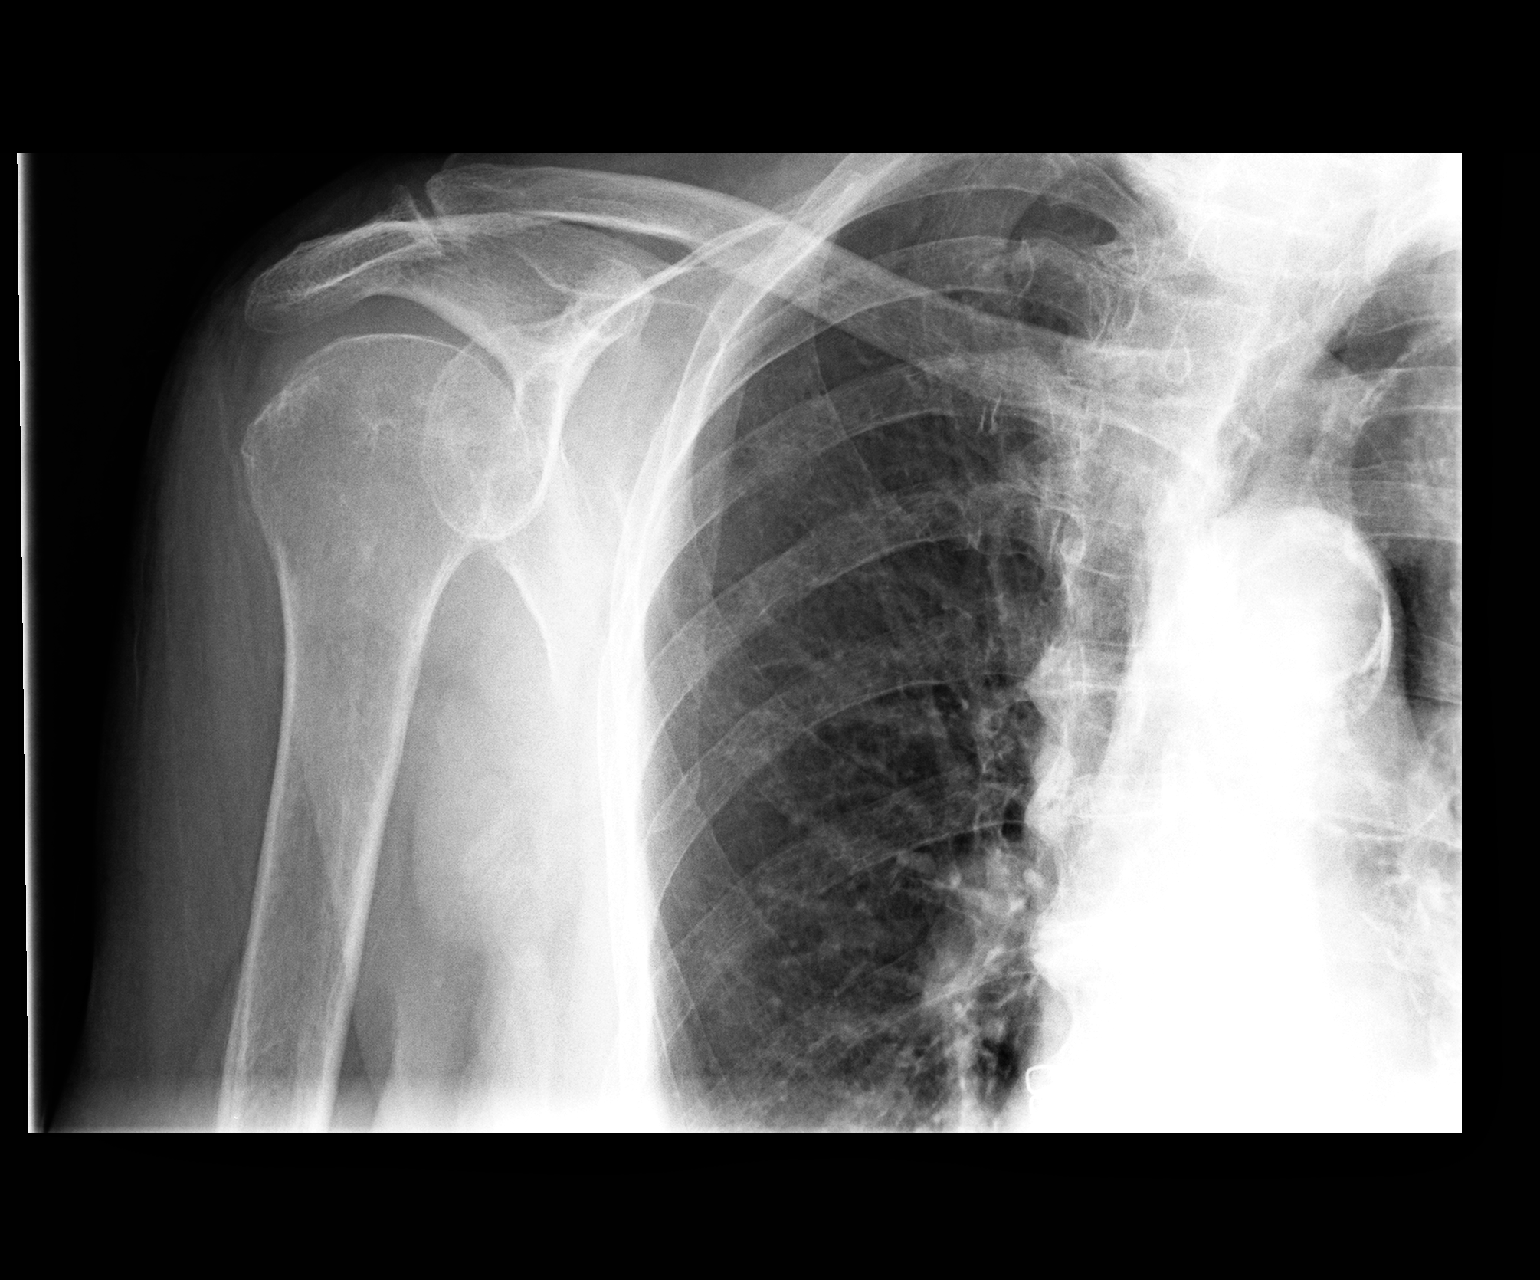

[view not recorded (2 of 3)]
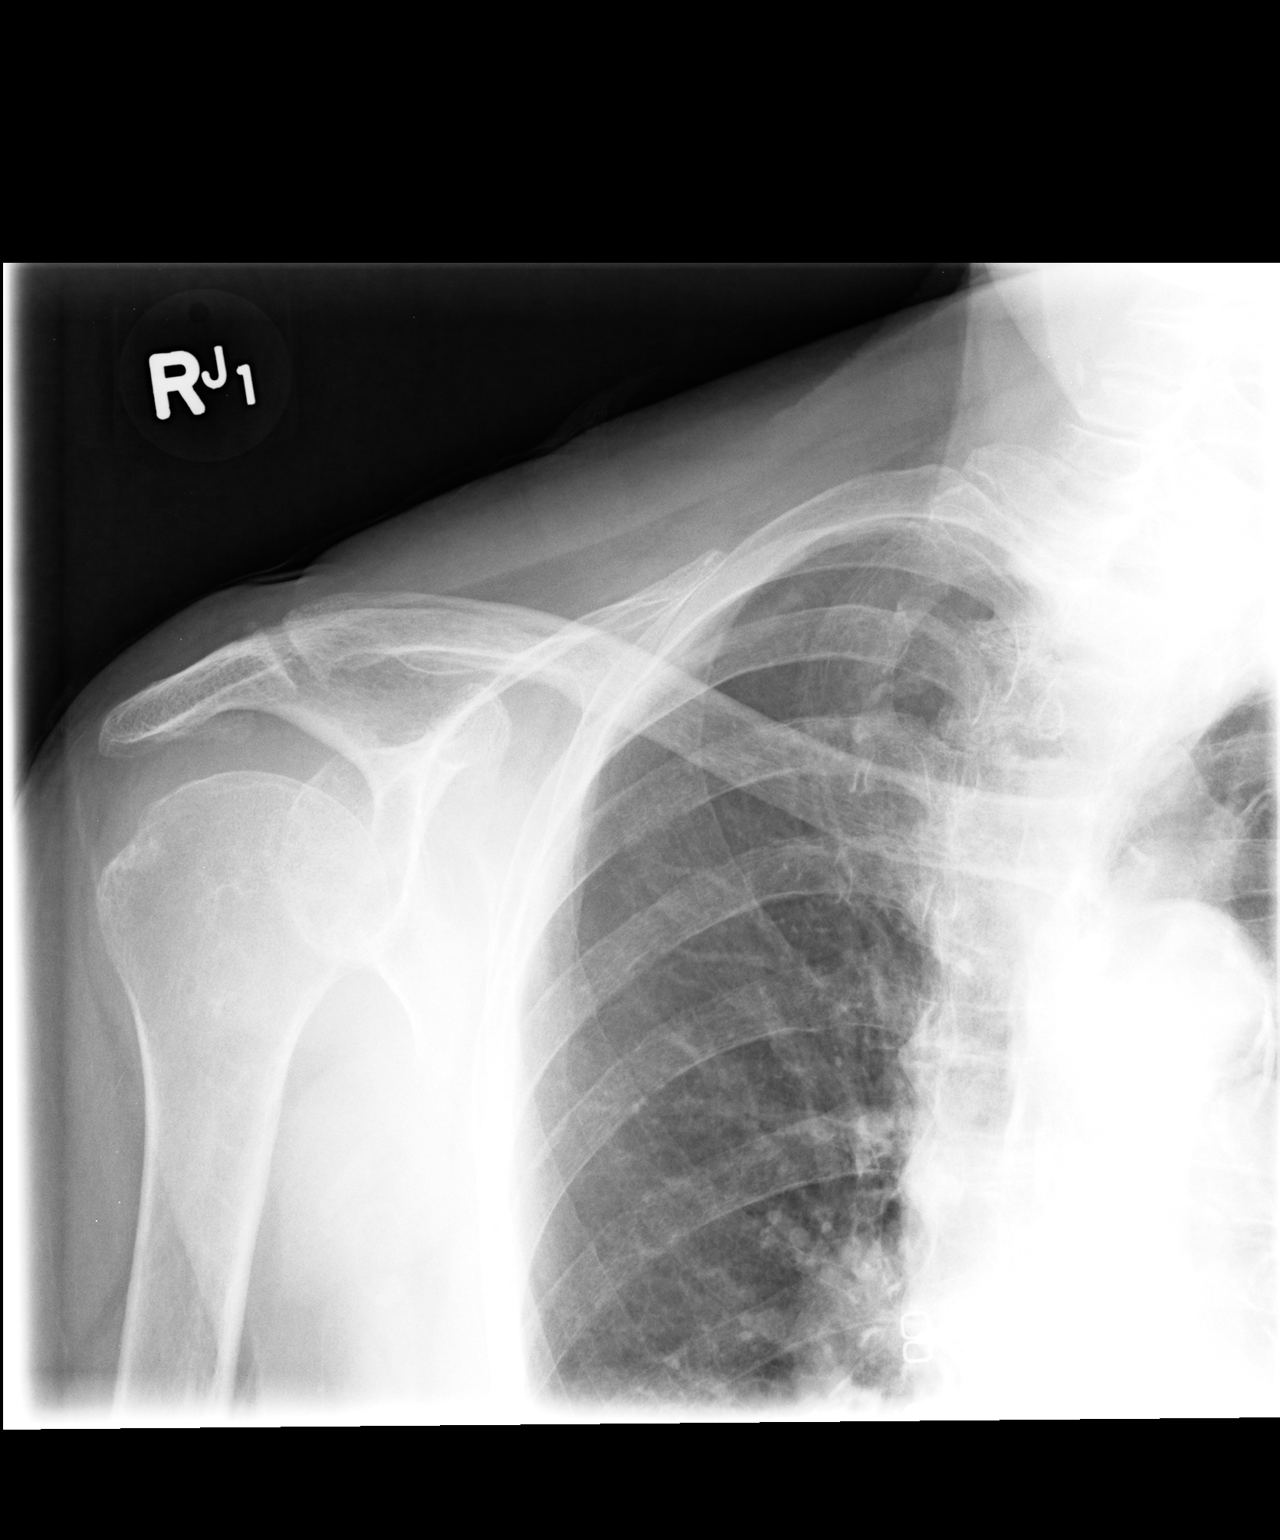

[view not recorded (3 of 3)]
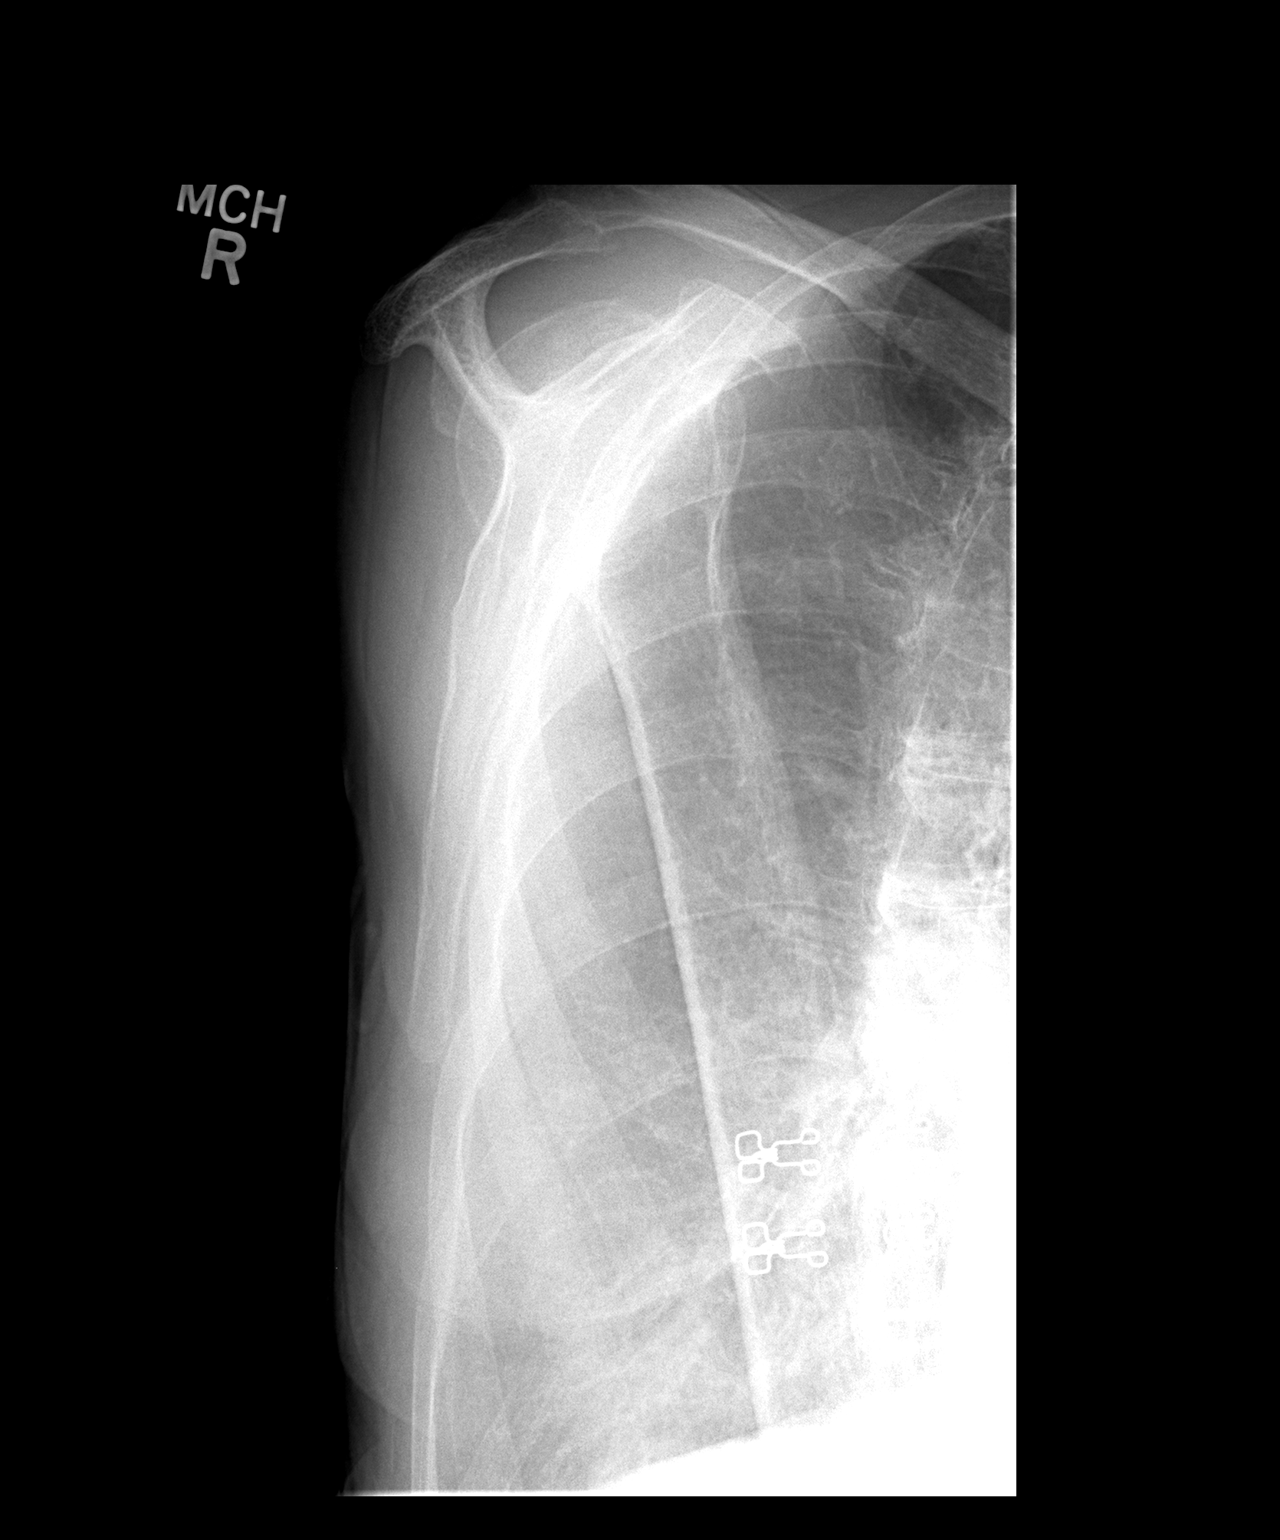

[3 of 3 positions shown; findings below may reference images not displayed]

FINDINGS: Three views of the right shoulder were obtained.
Degenerative changes at the right AC joint.  The right shoulder is
located without acute fracture.  Lucency in the right lung suggests
hyperinflation or emphysematous changes.
IMPRESSION: No acute bony abnormality.

Degenerative changes of the right AC joint.

## 2013-11-16 MED ORDER — FUROSEMIDE 40 MG PO TABS: 40.0000 mg | ORAL_TABLET | Freq: Every day | ORAL | Status: DC

## 2013-11-25 ENCOUNTER — Ambulatory Visit (INDEPENDENT_AMBULATORY_CARE_PROVIDER_SITE_OTHER): Payer: Medicare Other | Admitting: Pharmacist

## 2013-11-25 DIAGNOSIS — Z7901 Long term (current) use of anticoagulants: Secondary | ICD-10-CM

## 2013-11-25 DIAGNOSIS — Z5181 Encounter for therapeutic drug level monitoring: Secondary | ICD-10-CM

## 2013-11-25 DIAGNOSIS — I4891 Unspecified atrial fibrillation: Secondary | ICD-10-CM

## 2013-11-25 LAB — POCT INR: INR: 4.5

## 2013-11-30 ENCOUNTER — Other Ambulatory Visit: Payer: Medicare Other

## 2013-12-02 ENCOUNTER — Other Ambulatory Visit (INDEPENDENT_AMBULATORY_CARE_PROVIDER_SITE_OTHER): Payer: Medicare Other

## 2013-12-02 ENCOUNTER — Ambulatory Visit (INDEPENDENT_AMBULATORY_CARE_PROVIDER_SITE_OTHER): Payer: Medicare Other

## 2013-12-02 DIAGNOSIS — I4891 Unspecified atrial fibrillation: Secondary | ICD-10-CM

## 2013-12-02 DIAGNOSIS — Z5181 Encounter for therapeutic drug level monitoring: Secondary | ICD-10-CM

## 2013-12-02 DIAGNOSIS — Z7901 Long term (current) use of anticoagulants: Secondary | ICD-10-CM

## 2013-12-02 DIAGNOSIS — I2510 Atherosclerotic heart disease of native coronary artery without angina pectoris: Secondary | ICD-10-CM

## 2013-12-02 LAB — BASIC METABOLIC PANEL
BUN: 39 mg/dL — AB (ref 6–23)
CALCIUM: 9 mg/dL (ref 8.4–10.5)
CO2: 29 mEq/L (ref 19–32)
CREATININE: 1.5 mg/dL — AB (ref 0.4–1.2)
Chloride: 102 mEq/L (ref 96–112)
GFR: 35.76 mL/min — AB (ref >60.00)
GLUCOSE: 93 mg/dL (ref 70–99)
Potassium: 4 mEq/L (ref 3.5–5.1)
Sodium: 138 mEq/L (ref 135–145)

## 2013-12-02 LAB — POCT INR: INR: 2.6

## 2013-12-16 ENCOUNTER — Ambulatory Visit (INDEPENDENT_AMBULATORY_CARE_PROVIDER_SITE_OTHER): Payer: Medicare Other | Admitting: *Deleted

## 2013-12-16 DIAGNOSIS — Z7901 Long term (current) use of anticoagulants: Secondary | ICD-10-CM

## 2013-12-16 DIAGNOSIS — I4891 Unspecified atrial fibrillation: Secondary | ICD-10-CM

## 2013-12-16 DIAGNOSIS — Z5181 Encounter for therapeutic drug level monitoring: Secondary | ICD-10-CM

## 2013-12-16 LAB — POCT INR: INR: 2

## 2013-12-26 ENCOUNTER — Other Ambulatory Visit: Payer: Self-pay | Admitting: Cardiovascular Disease

## 2014-01-06 ENCOUNTER — Ambulatory Visit (INDEPENDENT_AMBULATORY_CARE_PROVIDER_SITE_OTHER): Payer: Medicare Other | Admitting: Pharmacist

## 2014-01-06 DIAGNOSIS — Z7901 Long term (current) use of anticoagulants: Secondary | ICD-10-CM

## 2014-01-06 DIAGNOSIS — Z5181 Encounter for therapeutic drug level monitoring: Secondary | ICD-10-CM

## 2014-01-06 DIAGNOSIS — I4891 Unspecified atrial fibrillation: Secondary | ICD-10-CM

## 2014-01-06 LAB — POCT INR: INR: 3.9

## 2014-01-18 ENCOUNTER — Other Ambulatory Visit: Payer: Self-pay | Admitting: Cardiovascular Disease

## 2014-01-27 ENCOUNTER — Ambulatory Visit (INDEPENDENT_AMBULATORY_CARE_PROVIDER_SITE_OTHER): Payer: Medicare Other | Admitting: Pharmacist

## 2014-01-27 DIAGNOSIS — I4891 Unspecified atrial fibrillation: Secondary | ICD-10-CM

## 2014-01-27 DIAGNOSIS — Z5181 Encounter for therapeutic drug level monitoring: Secondary | ICD-10-CM

## 2014-01-27 DIAGNOSIS — Z7901 Long term (current) use of anticoagulants: Secondary | ICD-10-CM

## 2014-01-27 LAB — POCT INR: INR: 4.3

## 2014-02-10 ENCOUNTER — Ambulatory Visit (INDEPENDENT_AMBULATORY_CARE_PROVIDER_SITE_OTHER): Payer: Medicare Other

## 2014-02-10 DIAGNOSIS — Z5181 Encounter for therapeutic drug level monitoring: Secondary | ICD-10-CM

## 2014-02-10 DIAGNOSIS — I4891 Unspecified atrial fibrillation: Secondary | ICD-10-CM

## 2014-02-10 DIAGNOSIS — Z7901 Long term (current) use of anticoagulants: Secondary | ICD-10-CM

## 2014-02-10 LAB — POCT INR: INR: 2.3

## 2014-03-03 ENCOUNTER — Ambulatory Visit (INDEPENDENT_AMBULATORY_CARE_PROVIDER_SITE_OTHER): Payer: Medicare Other | Admitting: Pharmacist

## 2014-03-03 DIAGNOSIS — I4891 Unspecified atrial fibrillation: Secondary | ICD-10-CM

## 2014-03-03 DIAGNOSIS — Z5181 Encounter for therapeutic drug level monitoring: Secondary | ICD-10-CM

## 2014-03-03 DIAGNOSIS — Z7901 Long term (current) use of anticoagulants: Secondary | ICD-10-CM

## 2014-03-03 LAB — POCT INR: INR: 2

## 2014-03-31 ENCOUNTER — Ambulatory Visit (INDEPENDENT_AMBULATORY_CARE_PROVIDER_SITE_OTHER): Payer: Medicare Other

## 2014-03-31 DIAGNOSIS — I4891 Unspecified atrial fibrillation: Secondary | ICD-10-CM

## 2014-03-31 DIAGNOSIS — Z7901 Long term (current) use of anticoagulants: Secondary | ICD-10-CM

## 2014-03-31 DIAGNOSIS — Z5181 Encounter for therapeutic drug level monitoring: Secondary | ICD-10-CM

## 2014-03-31 LAB — POCT INR: INR: 1.9

## 2014-04-04 ENCOUNTER — Other Ambulatory Visit (HOSPITAL_COMMUNITY): Payer: Self-pay | Admitting: Family Medicine

## 2014-04-04 DIAGNOSIS — Z139 Encounter for screening, unspecified: Secondary | ICD-10-CM

## 2014-04-28 ENCOUNTER — Ambulatory Visit (INDEPENDENT_AMBULATORY_CARE_PROVIDER_SITE_OTHER): Payer: Medicare Other | Admitting: Pharmacist

## 2014-04-28 DIAGNOSIS — I4891 Unspecified atrial fibrillation: Secondary | ICD-10-CM

## 2014-04-28 DIAGNOSIS — Z5181 Encounter for therapeutic drug level monitoring: Secondary | ICD-10-CM

## 2014-04-28 DIAGNOSIS — Z7901 Long term (current) use of anticoagulants: Secondary | ICD-10-CM

## 2014-04-28 LAB — POCT INR: INR: 2.2

## 2014-05-08 ENCOUNTER — Ambulatory Visit (HOSPITAL_COMMUNITY)
Admission: RE | Admit: 2014-05-08 | Discharge: 2014-05-08 | Disposition: A | Discharge status: Home / Self Care | Payer: Medicare Other | Source: Ambulatory Visit | Attending: Family Medicine | Admitting: Family Medicine

## 2014-05-08 DIAGNOSIS — Z1231 Encounter for screening mammogram for malignant neoplasm of breast: Secondary | ICD-10-CM | POA: Insufficient documentation

## 2014-05-08 DIAGNOSIS — Z139 Encounter for screening, unspecified: Secondary | ICD-10-CM

## 2014-05-12 ENCOUNTER — Other Ambulatory Visit: Payer: Self-pay | Admitting: Cardiovascular Disease

## 2014-05-26 ENCOUNTER — Ambulatory Visit (INDEPENDENT_AMBULATORY_CARE_PROVIDER_SITE_OTHER): Payer: Medicare Other | Admitting: Cardiovascular Disease

## 2014-05-26 ENCOUNTER — Ambulatory Visit (INDEPENDENT_AMBULATORY_CARE_PROVIDER_SITE_OTHER): Payer: Medicare Other | Admitting: *Deleted

## 2014-05-26 ENCOUNTER — Encounter: Payer: Self-pay | Admitting: Cardiovascular Disease

## 2014-05-26 VITALS — BP 110/60 | HR 76 | Ht 62.0 in | Wt 130.1 lb

## 2014-05-26 DIAGNOSIS — I2510 Atherosclerotic heart disease of native coronary artery without angina pectoris: Secondary | ICD-10-CM

## 2014-05-26 DIAGNOSIS — I350 Nonrheumatic aortic (valve) stenosis: Secondary | ICD-10-CM

## 2014-05-26 DIAGNOSIS — I080 Rheumatic disorders of both mitral and aortic valves: Secondary | ICD-10-CM

## 2014-05-26 DIAGNOSIS — Z5181 Encounter for therapeutic drug level monitoring: Secondary | ICD-10-CM

## 2014-05-26 DIAGNOSIS — Z7901 Long term (current) use of anticoagulants: Secondary | ICD-10-CM

## 2014-05-26 DIAGNOSIS — I4891 Unspecified atrial fibrillation: Secondary | ICD-10-CM

## 2014-05-26 DIAGNOSIS — R42 Dizziness and giddiness: Secondary | ICD-10-CM

## 2014-05-26 DIAGNOSIS — I482 Chronic atrial fibrillation, unspecified: Secondary | ICD-10-CM

## 2014-05-26 LAB — POCT INR: INR: 1.8

## 2014-05-26 MED ORDER — FUROSEMIDE 20 MG PO TABS: 20.0000 mg | ORAL_TABLET | Freq: Every day | ORAL | Status: DC

## 2014-05-26 NOTE — Patient Instructions (Signed)
Your physician wants you to follow-up in:  6 months.  You will receive a reminder letter in the mail two months in advance. If you don't receive a letter, please call our office to schedule the follow-up appointment   Your physician has recommended you make the following change in your medication:  Decrease furosemide to 20 mg by mouth daily

## 2014-05-26 NOTE — Progress Notes (Signed)
History of Present Illness: 80 yo WF with history of CAD, chronic atrial fibrillation managed with rate control medications and Coumadin, diastolic CHF, HTN and hyperlipidemia here today for cardiac follow up. She's had non-obstructive CAD by catheterization in 2003 with 40% mid LAD, plaque disease in other vessels. Echo August 2013 normal LV function, mild MR, Mild AI, mild AS, mild TR. Mild carotid disease by dopplers September 2014.   She is here today for follow up. She was in the coumadin clinic this am. No bleeding issues. No exertional chest pain. Breathing ok. Still with dizziness. No near syncope or syncope. Overall feels weak. Depressed since loss of husband, daughter and sister in last year.   Primary Care Physician: Marjean Donna  Last Lipid Profile:Lipid Panel     Component Value Date/Time   CHOL 141 11/04/2013 1231   TRIG 93.0 11/04/2013 1231   HDL 67.40 11/04/2013 1231   CHOLHDL 2 11/04/2013 1231   VLDL 18.6 11/04/2013 1231   LDLCALC 55 11/04/2013 1231    Past Medical History  Diagnosis Date  . Hyperlipidemia   . Hypertension   . Coronary artery disease   . Venous insufficiency   . CHF (congestive heart failure)     acute diastolic heart failure  . Arrhythmia     chronic afib  . GERD (gastroesophageal reflux disease)     Past Surgical History  Procedure Laterality Date  . Back surgery    . Hernia repair    . Colonoscopy N/A 09/30/2012    Procedure: COLONOSCOPY;  Surgeon: Rogene Houston, MD;  Location: [facility];  Service: Endoscopy;  Laterality: N/A;  930    Current Outpatient Prescriptions  Medication Sig Dispense Refill  . acetaminophen (TYLENOL) 500 MG tablet Take 500 mg by mouth every 6 (six) hours as needed for pain.    Marland Kitchen aspirin 81 MG tablet Take 81 mg by mouth daily.      . calcium carbonate (OS-CAL) 600 MG TABS Take 600 mg by mouth daily.      Marland Kitchen diltiazem (CARDIZEM CD) 120 MG 24 hr capsule TAKE (1) CAPSULE DAILY 30 capsule 6  . furosemide  (LASIX) 40 MG tablet Take 1 tablet (40 mg total) by mouth daily. 30 tablet 6  . meclizine (ANTIVERT) 12.5 MG tablet Take 12.5 mg by mouth 3 (three) times daily as needed.    . metoprolol (LOPRESSOR) 50 MG tablet TAKE 1 TAB EVERY MORNING AND 1/2 TAB IN THE EVENING AS NEEDED 45 tablet 3  . Multiple Vitamins-Minerals (MULTIVITAMINS THER. W/MINERALS) TABS Take 1 tablet by mouth daily.    . rosuvastatin (CRESTOR) 20 MG tablet Take 20 mg by mouth daily.      Marland Kitchen warfarin (COUMADIN) 5 MG tablet TAKE AS DIRECTED. 30 tablet 2   No current facility-administered medications for this visit.    Allergies  Allergen Reactions  . Amoxicillin   . Penicillins   . Sulfonamide Derivatives     History   Social History  . Marital Status: Married    Spouse Name: N/A    Number of Children: N/A  . Years of Education: N/A   Occupational History  . retired    Social History Main Topics  . Smoking status: Never Smoker   . Smokeless tobacco: Not on file  . Alcohol Use: No  . Drug Use: No  . Sexual Activity: Not on file   Other Topics Concern  . Not on file   Social History Narrative  Family History  Problem Relation Age of Onset  . Colon cancer Neg Hx   . Colon polyps Sister     Review of Systems:  As stated in the HPI and otherwise negative.   BP 110/60 mmHg  Pulse 76  Ht 5\' 2"  (1.575 m)  Wt 130 lb 1.9 oz (59.022 kg)  BMI 23.79 kg/m2  SpO2 95%  Physical Examination: General: Well developed, well nourished, NAD HEENT: OP clear, mucus membranes moist SKIN: warm, dry. No rashes. Neuro: No focal deficits Musculoskeletal: Muscle strength 5/5 all ext Psychiatric: Mood and affect normal Neck: No JVD, no carotid bruits, no thyromegaly, no lymphadenopathy. Lungs:Clear bilaterally, no wheezes, rhonci, crackles Cardiovascular: Irregular irregular. No murmurs, gallops or rubs. Abdomen:Soft. Bowel sounds present. Non-tender.  Extremities: No lower extremity edema. Pulses are 2 + in the  bilateral DP/PT.  Echo 03/19/12:  Left ventricle: The cavity size was normal. Wall thickness was normal. Systolic function was normal. The estimated ejection fraction was in the range of 55% to 60%. - Aortic valve: Calcified non coronary cusp There was mild stenosis. Mild regurgitation. Mean gradient: 69mm Hg (S). Peak gradient: 38mm Hg (S). - Mitral valve: Mild regurgitation. - Left atrium: The atrium was severely dilated. - Right atrium: The atrium was moderately dilated. - Atrial septum: No defect or patent foramen ovale was identified. - Tricuspid valve: Moderate regurgitation. - Pulmonary arteries: PA peak pressure: 41mm Hg (S).  EKG: Atrial fib, rate 76 bpm. PVCs. LVH. Non-specific ST abnormality.   Assessment and Plan:   1. Atrial fibrillation: Rate controlled. Continue Cardizem and metoprolol for rate control. Continue coumadin for anti-coagulation  2. CAD: Mild by cath 2003. She has had no exertional chest pain. Continue current therapy.   3. Mitral regurgitation: Mild by echo August 2013.   4. Aortic stenosis: Mild by echo August 2013. Will repeat echo in 2016.  5. Hyperlipidemia: Lipids well controlled. Continue statin.   6. Dizziness: No obstructive carotid disease. Will try cutting Lasix back to 20 mg daily. She is not known to have severe valve disease. LV function has been normal. Minimal CAD.

## 2014-06-09 ENCOUNTER — Ambulatory Visit (INDEPENDENT_AMBULATORY_CARE_PROVIDER_SITE_OTHER): Payer: Medicare Other | Admitting: *Deleted

## 2014-06-09 DIAGNOSIS — Z7901 Long term (current) use of anticoagulants: Secondary | ICD-10-CM

## 2014-06-09 DIAGNOSIS — I4891 Unspecified atrial fibrillation: Secondary | ICD-10-CM

## 2014-06-09 DIAGNOSIS — Z5181 Encounter for therapeutic drug level monitoring: Secondary | ICD-10-CM

## 2014-06-09 DIAGNOSIS — I482 Chronic atrial fibrillation, unspecified: Secondary | ICD-10-CM

## 2014-06-09 LAB — POCT INR: INR: 2.4

## 2014-06-22 ENCOUNTER — Other Ambulatory Visit: Payer: Self-pay | Admitting: Cardiovascular Disease

## 2014-06-25 ENCOUNTER — Emergency Department (HOSPITAL_COMMUNITY)
Admission: EM | Admit: 2014-06-25 | Discharge: 2014-06-25 | Disposition: A | Discharge status: Home / Self Care | Payer: Medicare Other | Attending: Emergency Medicine | Admitting: Emergency Medicine

## 2014-06-25 ENCOUNTER — Encounter (HOSPITAL_COMMUNITY): Payer: Self-pay | Admitting: Emergency Medicine

## 2014-06-25 ENCOUNTER — Emergency Department (HOSPITAL_COMMUNITY): Payer: Medicare Other

## 2014-06-25 DIAGNOSIS — N329 Bladder disorder, unspecified: Secondary | ICD-10-CM | POA: Diagnosis not present

## 2014-06-25 DIAGNOSIS — I509 Heart failure, unspecified: Secondary | ICD-10-CM | POA: Insufficient documentation

## 2014-06-25 DIAGNOSIS — R42 Dizziness and giddiness: Secondary | ICD-10-CM | POA: Insufficient documentation

## 2014-06-25 DIAGNOSIS — Z7982 Long term (current) use of aspirin: Secondary | ICD-10-CM | POA: Insufficient documentation

## 2014-06-25 DIAGNOSIS — I2510 Atherosclerotic heart disease of native coronary artery without angina pectoris: Secondary | ICD-10-CM | POA: Insufficient documentation

## 2014-06-25 DIAGNOSIS — N3289 Other specified disorders of bladder: Secondary | ICD-10-CM

## 2014-06-25 DIAGNOSIS — E785 Hyperlipidemia, unspecified: Secondary | ICD-10-CM | POA: Diagnosis not present

## 2014-06-25 DIAGNOSIS — I341 Nonrheumatic mitral (valve) prolapse: Secondary | ICD-10-CM | POA: Diagnosis not present

## 2014-06-25 DIAGNOSIS — N938 Other specified abnormal uterine and vaginal bleeding: Secondary | ICD-10-CM | POA: Diagnosis present

## 2014-06-25 DIAGNOSIS — Z7901 Long term (current) use of anticoagulants: Secondary | ICD-10-CM | POA: Insufficient documentation

## 2014-06-25 DIAGNOSIS — Z8719 Personal history of other diseases of the digestive system: Secondary | ICD-10-CM | POA: Insufficient documentation

## 2014-06-25 DIAGNOSIS — N289 Disorder of kidney and ureter, unspecified: Secondary | ICD-10-CM | POA: Diagnosis not present

## 2014-06-25 DIAGNOSIS — Z880 Allergy status to penicillin: Secondary | ICD-10-CM | POA: Diagnosis not present

## 2014-06-25 DIAGNOSIS — I10 Essential (primary) hypertension: Secondary | ICD-10-CM | POA: Diagnosis not present

## 2014-06-25 DIAGNOSIS — I499 Cardiac arrhythmia, unspecified: Secondary | ICD-10-CM | POA: Insufficient documentation

## 2014-06-25 DIAGNOSIS — N939 Abnormal uterine and vaginal bleeding, unspecified: Secondary | ICD-10-CM

## 2014-06-25 DIAGNOSIS — Z79899 Other long term (current) drug therapy: Secondary | ICD-10-CM | POA: Insufficient documentation

## 2014-06-25 HISTORY — DX: Nonrheumatic mitral (valve) prolapse: I34.1

## 2014-06-25 LAB — CBC WITH DIFFERENTIAL/PLATELET
BASOS ABS: 0 10*3/uL (ref 0.0–0.1)
Basophils Relative: 0 % (ref 0–1)
EOS ABS: 0 10*3/uL (ref 0.0–0.7)
Eosinophils Relative: 1 % (ref 0–5)
HCT: 39.5 % (ref 36.0–46.0)
HEMOGLOBIN: 13.3 g/dL (ref 12.0–15.0)
Lymphocytes Relative: 32 % (ref 12–46)
Lymphs Abs: 2.1 10*3/uL (ref 0.7–4.0)
MCH: 32.6 pg (ref 26.0–34.0)
MCHC: 33.7 g/dL (ref 30.0–36.0)
MCV: 96.8 fL (ref 78.0–100.0)
MONOS PCT: 11 % (ref 3–12)
Monocytes Absolute: 0.7 10*3/uL (ref 0.1–1.0)
NEUTROS PCT: 56 % (ref 43–77)
Neutro Abs: 3.6 10*3/uL (ref 1.7–7.7)
Platelets: 227 10*3/uL (ref 150–400)
RBC: 4.08 MIL/uL (ref 3.87–5.11)
RDW: 17.5 % — AB (ref 11.5–15.5)
WBC: 6.5 10*3/uL (ref 4.0–10.5)

## 2014-06-25 LAB — BASIC METABOLIC PANEL
ANION GAP: 9 (ref 5–15)
BUN: 60 mg/dL — AB (ref 6–23)
CO2: 26 mEq/L (ref 19–32)
CREATININE: 2.53 mg/dL — AB (ref 0.50–1.10)
Calcium: 8.7 mg/dL (ref 8.4–10.5)
Chloride: 100 mEq/L (ref 96–112)
GFR, EST AFRICAN AMERICAN: 19 mL/min — AB (ref >90)
GFR, EST NON AFRICAN AMERICAN: 16 mL/min — AB (ref >90)
Glucose, Bld: 113 mg/dL — ABNORMAL HIGH (ref 70–99)
Potassium: 5.5 mEq/L — ABNORMAL HIGH (ref 3.7–5.3)
Sodium: 135 mEq/L — ABNORMAL LOW (ref 137–147)

## 2014-06-25 LAB — WET PREP, GENITAL
Clue Cells Wet Prep HPF POC: NONE SEEN
TRICH WET PREP: NONE SEEN
Yeast Wet Prep HPF POC: NONE SEEN

## 2014-06-25 LAB — PROTIME-INR
INR: 3.1 — AB (ref 0.00–1.49)
Prothrombin Time: 32.2 seconds — ABNORMAL HIGH (ref 11.6–15.2)

## 2014-06-25 MED ORDER — IOHEXOL 300 MG/ML  SOLN
50.0000 mL | Freq: Once | INTRAMUSCULAR | Status: AC | PRN
  Administered 2014-06-25: 50 mL via ORAL

## 2014-06-25 NOTE — ED Notes (Signed)
Charge RN aware. Labs per protocol ordered. Pt alert and oriented. Pt skin WNL. Pt not pale or diaphoretic.

## 2014-06-25 NOTE — Discharge Instructions (Signed)
Follow-up with [facility].  Call tomorrow for appointment. I'm concerned that you may have a cancer in your bladder.

## 2014-06-25 NOTE — ED Provider Notes (Signed)
CSN: [id number]     Arrival date & time 06/25/14  1154 History  This chart was scribed for Nat Christen, MD by Jeanell Sparrow, ED Scribe. This patient was seen in room APA01/AP[facility]01 and the patient's care was started at 2:00 PM.   Chief Complaint  Patient presents with  . Vaginal Bleeding   The history is provided by the patient and a relative. No language interpreter was used.   HPI Comments: Tracy [name] is a 80 y.o. female who presents to the Emergency Department complaining of vaginal bleeding that started 2 days ago. She reports that last night that the bleeding has been persistent until today when it eased off. She states that she had to change pads every hour and the blood has been soaking through the pad. She reports some intermittent dizziness. She reports no prior hx of vaginal bleeding. She states that she is currently on coumadin. She denies any abdominal pain, emesis, or diarrhea. This has never happened before. Severity is moderate.  Past Medical History  Diagnosis Date  . Hyperlipidemia   . Hypertension   . Coronary artery disease   . Venous insufficiency   . CHF (congestive heart failure)     acute diastolic heart failure  . Arrhythmia     chronic afib  . GERD (gastroesophageal reflux disease)   . Mitral valve prolapse    Past Surgical History  Procedure Laterality Date  . Back surgery    . Hernia repair    . Colonoscopy N/A 09/30/2012    Procedure: COLONOSCOPY;  Surgeon: Rogene Houston, MD;  Location: [facility];  Service: Endoscopy;  Laterality: N/A;  930   Family History  Problem Relation Age of Onset  . Colon cancer Neg Hx   . Colon polyps Sister    History  Substance Use Topics  . Smoking status: Never Smoker   . Smokeless tobacco: Not on file  . Alcohol Use: No   OB History    No data available     Review of Systems  Gastrointestinal: Negative for vomiting, abdominal pain and diarrhea.  Genitourinary: Positive for vaginal bleeding.   Neurological: Positive for dizziness.  Hematological: Bruises/bleeds easily.  All other systems reviewed and are negative.  Allergies  Amoxicillin; Penicillins; and Sulfonamide derivatives  Home Medications   Prior to Admission medications   Medication Sig Start Date End Date Taking? Authorizing Provider  acetaminophen (TYLENOL) 500 MG tablet Take 500 mg by mouth every 6 (six) hours as needed for pain.   Yes Historical Provider, MD  aspirin 81 MG tablet Take 81 mg by mouth daily.     Yes Historical Provider, MD  calcium carbonate (OS-CAL) 600 MG TABS Take 600 mg by mouth daily.     Yes Historical Provider, MD  diltiazem (CARDIZEM CD) 120 MG 24 hr capsule TAKE (1) CAPSULE DAILY 05/15/14  Yes Burnell Blanks, MD  furosemide (LASIX) 20 MG tablet Take 1 tablet (20 mg total) by mouth daily. 05/26/14  Yes Burnell Blanks, MD  meclizine (ANTIVERT) 12.5 MG tablet Take 12.5 mg by mouth 3 (three) times daily as needed.   Yes Historical Provider, MD  metoprolol (LOPRESSOR) 50 MG tablet TAKE 1 TAB EVERY MORNING AND 1/2 TAB IN THE EVENING AS NEEDED Patient taking differently: TAKE 1 TAB EVERY MORNING 06/23/14  Yes Burnell Blanks, MD  Multiple Vitamins-Minerals (MULTIVITAMINS THER. W/MINERALS) TABS Take 1 tablet by mouth daily.   Yes Historical Provider, MD  rosuvastatin (CRESTOR) 20 MG  tablet Take 20 mg by mouth daily.     Yes Historical Provider, MD  warfarin (COUMADIN) 5 MG tablet Take 2.5-5 mg by mouth daily. Patient takes 2.5 mg everyday except for Friday when she takes 5 mg.   Yes Historical Provider, MD  warfarin (COUMADIN) 5 MG tablet TAKE AS DIRECTED. Patient not taking: Reported on 06/25/2014 01/18/14   Burnell Blanks, MD   BP 104/64 mmHg  Pulse 88  Temp(Src) 98.1 F (36.7 C) (Oral)  Resp 18  Ht 5\' 3"  (1.6 m)  Wt 137 lb (62.143 kg)  BMI 24.27 kg/m2  SpO2 99% Physical Exam  Constitutional: She is oriented to person, place, and time. She appears well-developed  and well-nourished.  HENT:  Head: Normocephalic and atraumatic.  Eyes: Conjunctivae and EOM are normal. Pupils are equal, round, and reactive to light.  Neck: Normal range of motion. Neck supple.  Cardiovascular: Normal rate, regular rhythm and normal heart sounds.   Pulmonary/Chest: Effort normal and breath sounds normal.  Abdominal: Soft. Bowel sounds are normal.  Genitourinary:  Pelvic exam: Normal introitus.  Minimal pinkness in cervical os. No cervical motion tenderness. No adnexal tenderness.  Musculoskeletal: Normal range of motion.  Neurological: She is alert and oriented to person, place, and time.  Skin: Skin is warm and dry.  Psychiatric: She has a normal mood and affect. Her behavior is normal.  Nursing note and vitals reviewed.   ED Course  Procedures (including critical care time) DIAGNOSTIC STUDIES: Oxygen Saturation is 99% on RA, normal by my interpretation.    COORDINATION OF CARE: 2:04 PM- Pt advised of plan for treatment which includes labs and pt agrees.  Labs Review Labs Reviewed  WET PREP, GENITAL - Abnormal; Notable for the following:    WBC, Wet Prep HPF POC MODERATE (*)    All other components within normal limits  PROTIME-INR - Abnormal; Notable for the following:    Prothrombin Time 32.2 (*)    INR 3.10 (*)    All other components within normal limits  CBC WITH DIFFERENTIAL - Abnormal; Notable for the following:    RDW 17.5 (*)    All other components within normal limits  BASIC METABOLIC PANEL - Abnormal; Notable for the following:    Sodium 135 (*)    Potassium 5.5 (*)    Glucose, Bld 113 (*)    BUN 60 (*)    Creatinine, Ser 2.53 (*)    GFR calc non Af Amer 16 (*)    GFR calc Af Amer 19 (*)    All other components within normal limits    Imaging Review No results found.   EKG Interpretation None      MDM   Final diagnoses:  Vagina bleeding  Bladder mass  Renal insufficiency   CT scan reveals a large mass lesion in the left  side of the urinary bladder. Discussed with urologist Dr. Tresa Moore.  Patient will follow up this week. Test results discussed with patient and her daughter.  I personally performed the services described in this documentation, which was scribed in my presence. The recorded information has been reviewed and is accurate.     Nat Christen, MD 06/28/14 [phone]

## 2014-06-25 NOTE — ED Notes (Addendum)
Pt reports vaginal bleeding x2-3 days. Pt reports is taking coumadin. Pt reports intermittent dizziness. nad noted. Pt denies any abdominal pain,v/d.pt reports is changing 2 pads ever hour.

## 2014-06-25 NOTE — ED Notes (Signed)
CT called, patient has finished contrast.

## 2014-06-30 ENCOUNTER — Ambulatory Visit (INDEPENDENT_AMBULATORY_CARE_PROVIDER_SITE_OTHER): Payer: Medicare Other | Admitting: *Deleted

## 2014-06-30 DIAGNOSIS — Z7901 Long term (current) use of anticoagulants: Secondary | ICD-10-CM

## 2014-06-30 DIAGNOSIS — I482 Chronic atrial fibrillation, unspecified: Secondary | ICD-10-CM

## 2014-06-30 DIAGNOSIS — Z5181 Encounter for therapeutic drug level monitoring: Secondary | ICD-10-CM

## 2014-06-30 DIAGNOSIS — I4891 Unspecified atrial fibrillation: Secondary | ICD-10-CM

## 2014-06-30 LAB — POCT INR: INR: 3

## 2014-07-06 ENCOUNTER — Telehealth: Payer: Self-pay | Admitting: Cardiovascular Disease

## 2014-07-06 NOTE — Telephone Encounter (Signed)
Received request from Nurse fax box, documents faxed for surgical clearance. To: [facility]  Fax number: [phone] Attention: 12.17.15/km

## 2014-07-07 ENCOUNTER — Telehealth: Payer: Self-pay | Admitting: Cardiovascular Disease

## 2014-07-07 NOTE — Telephone Encounter (Signed)
Checking on surgical clearance request on this pt, pls call

## 2014-07-07 NOTE — Telephone Encounter (Signed)
Returned Tracy [name]'s call. Was unable to find any fax for surgical clearance for patient. Asked her to resend.

## 2014-07-13 ENCOUNTER — Telehealth: Payer: Self-pay | Admitting: Cardiovascular Disease

## 2014-07-13 NOTE — Telephone Encounter (Signed)
New message      Checking on status of surgical clearance.

## 2014-07-17 ENCOUNTER — Other Ambulatory Visit: Payer: Self-pay | Admitting: Urology

## 2014-07-17 ENCOUNTER — Other Ambulatory Visit (HOSPITAL_COMMUNITY): Payer: Self-pay | Admitting: *Deleted

## 2014-07-17 NOTE — Progress Notes (Signed)
Called Dr.Manny's office requested release of orders in Epic to sign and held surgery 07-19-14 pre op 07-18-14 Thanks

## 2014-07-17 NOTE — Telephone Encounter (Signed)
Clearance was faxed on December 17,2015. Medical records will refax again this morning.  I left message with this information on Chasity's voicemail. Left message to call back if questions.

## 2014-07-18 ENCOUNTER — Encounter (HOSPITAL_COMMUNITY): Payer: Self-pay

## 2014-07-18 ENCOUNTER — Encounter (HOSPITAL_COMMUNITY)
Admission: RE | Admit: 2014-07-18 | Discharge: 2014-07-18 | Disposition: A | Discharge status: Home / Self Care | Payer: Medicare Other | Source: Ambulatory Visit | Attending: Urology | Admitting: Urology

## 2014-07-18 ENCOUNTER — Ambulatory Visit (HOSPITAL_COMMUNITY)
Admission: RE | Admit: 2014-07-18 | Discharge: 2014-07-18 | Disposition: A | Discharge status: Home / Self Care | Payer: Medicare Other | Source: Ambulatory Visit | Attending: Anesthesiology | Admitting: Anesthesiology

## 2014-07-18 DIAGNOSIS — Z01818 Encounter for other preprocedural examination: Secondary | ICD-10-CM

## 2014-07-18 DIAGNOSIS — I4891 Unspecified atrial fibrillation: Secondary | ICD-10-CM

## 2014-07-18 DIAGNOSIS — I10 Essential (primary) hypertension: Secondary | ICD-10-CM

## 2014-07-18 DIAGNOSIS — J984 Other disorders of lung: Secondary | ICD-10-CM

## 2014-07-18 HISTORY — DX: Headache: R51

## 2014-07-18 HISTORY — DX: Headache, unspecified: R51.9

## 2014-07-18 HISTORY — DX: Sleep disorder, unspecified: G47.9

## 2014-07-18 HISTORY — DX: Pain in right shoulder: M25.511

## 2014-07-18 HISTORY — DX: Unspecified osteoarthritis, unspecified site: M19.90

## 2014-07-18 LAB — BASIC METABOLIC PANEL
Anion gap: 4 — ABNORMAL LOW (ref 5–15)
BUN: 61 mg/dL — ABNORMAL HIGH (ref 6–23)
CO2: 28 mmol/L (ref 19–32)
CREATININE: 2.12 mg/dL — AB (ref 0.50–1.10)
Calcium: 8.4 mg/dL (ref 8.4–10.5)
Chloride: 100 mEq/L (ref 96–112)
GFR calc Af Amer: 23 mL/min — ABNORMAL LOW (ref >90)
GFR, EST NON AFRICAN AMERICAN: 20 mL/min — AB (ref >90)
Glucose, Bld: 117 mg/dL — ABNORMAL HIGH (ref 70–99)
Potassium: 5.8 mmol/L — ABNORMAL HIGH (ref 3.5–5.1)
Sodium: 132 mmol/L — ABNORMAL LOW (ref 135–145)

## 2014-07-18 LAB — CBC
HCT: 39.6 % (ref 36.0–46.0)
HEMOGLOBIN: 12.6 g/dL (ref 12.0–15.0)
MCH: 32 pg (ref 26.0–34.0)
MCHC: 31.8 g/dL (ref 30.0–36.0)
MCV: 100.5 fL — ABNORMAL HIGH (ref 78.0–100.0)
PLATELETS: 229 10*3/uL (ref 150–400)
RBC: 3.94 MIL/uL (ref 3.87–5.11)
RDW: 16 % — ABNORMAL HIGH (ref 11.5–15.5)
WBC: 6.5 10*3/uL (ref 4.0–10.5)

## 2014-07-18 LAB — APTT: aPTT: 26 seconds (ref 24–37)

## 2014-07-18 LAB — PROTIME-INR
INR: 0.98 (ref 0.00–1.49)
Prothrombin Time: 13.1 seconds (ref 11.6–15.2)

## 2014-07-18 NOTE — Progress Notes (Signed)
EKG 05/26/14 on EPIC

## 2014-07-18 NOTE — Patient Instructions (Addendum)
Tracy [name]  07/18/2014   Your procedure is scheduled on: Wednesday 07/19/14  Report to [facility]  Entrance and follow signs to               [place] at 08:30 AM.  Call this number if you have problems the morning of surgery [phone]   Remember:  Do not eat food or drink liquids :After Midnight.     Take these medicines the morning of surgery with A SIP OF WATER: diltiazem, metoprolol                               You may not have any metal on your body including hair pins and              piercings  Do not wear jewelry, make-up, lotions, powders or perfumes.             Do not wear nail polish.  Do not shave  48 hours prior to surgery.              Men may shave face and neck.  Do not bring valuables to the hospital. [place].  Contacts, dentures or bridgework may not be worn into surgery.       Patients discharged the day of surgery will not be allowed to drive home.  Name and phone number of your driver: Tracy [name] [phone]  _____________________________________________________________________             [facility] - Preparing for Surgery Before surgery, you can play an important role.  Because skin is not sterile, your skin needs to be as free of germs as possible.  You can reduce the number of germs on your skin by washing with CHG (chlorahexidine gluconate) soap before surgery.  CHG is an antiseptic cleaner which kills germs and bonds with the skin to continue killing germs even after washing. Please DO NOT use if you have an allergy to CHG or antibacterial soaps.  If your skin becomes reddened/irritated stop using the CHG and inform your nurse when you arrive at Short Stay. Do not shave (including legs and underarms) for at least 48 hours prior to the first CHG shower.  You may shave your face/neck. Please follow these instructions carefully:  1.  Shower with CHG Soap the night  before surgery and the  morning of Surgery.  2.  If you choose to wash your hair, wash your hair first as usual with your  normal  shampoo.  3.  After you shampoo, rinse your hair and body thoroughly to remove the  shampoo.                            4.  Use CHG as you would any other liquid soap.  You can apply chg directly  to the skin and wash                       Gently with a scrungie or clean washcloth.  5.  Apply the CHG Soap to your body ONLY FROM THE NECK DOWN.   Do not use on face/ open  Wound or open sores. Avoid contact with eyes, ears mouth and genitals (private parts).                       Wash face,  Genitals (private parts) with your normal soap.             6.  Wash thoroughly, paying special attention to the area where your surgery  will be performed.  7.  Thoroughly rinse your body with warm water from the neck down.  8.  DO NOT shower/wash with your normal soap after using and rinsing off  the CHG Soap.                9.  Pat yourself dry with a clean towel.            10.  Wear clean pajamas.            11.  Place clean sheets on your bed the night of your first shower and do not  sleep with pets. Day of Surgery : Do not apply any lotions/deodorants the morning of surgery.  Please wear clean clothes to the hospital/surgery center.  FAILURE TO FOLLOW THESE INSTRUCTIONS MAY RESULT IN THE CANCELLATION OF YOUR SURGERY PATIENT SIGNATURE_________________________________  NURSE SIGNATURE__________________________________  ________________________________________________________________________

## 2014-07-18 NOTE — Progress Notes (Signed)
Surgery time change from 10:30 on 07/19/14 to 10:07 on 07/19/14. Pt made aware of change and agreed to arrive at short stay at 8:00 am on 07/19/14.

## 2014-07-19 ENCOUNTER — Encounter (HOSPITAL_COMMUNITY)
Admission: RE | Disposition: A | Discharge status: Home / Self Care | Payer: Self-pay | Source: Ambulatory Visit | Attending: Urology

## 2014-07-19 ENCOUNTER — Inpatient Hospital Stay (HOSPITAL_COMMUNITY)
Admission: RE | Admit: 2014-07-19 | Discharge: 2014-07-22 | DRG: 670 | Disposition: A | Discharge status: Home / Self Care | Payer: Medicare Other | Source: Ambulatory Visit | Attending: Urology | Admitting: Urology

## 2014-07-19 ENCOUNTER — Ambulatory Visit (HOSPITAL_COMMUNITY): Payer: Medicare Other | Admitting: Certified Registered"

## 2014-07-19 ENCOUNTER — Encounter (HOSPITAL_COMMUNITY): Payer: Self-pay | Admitting: Certified Registered"

## 2014-07-19 ENCOUNTER — Telehealth: Payer: Self-pay | Admitting: Cardiovascular Disease

## 2014-07-19 DIAGNOSIS — J984 Other disorders of lung: Secondary | ICD-10-CM | POA: Diagnosis present

## 2014-07-19 DIAGNOSIS — I2510 Atherosclerotic heart disease of native coronary artery without angina pectoris: Secondary | ICD-10-CM | POA: Diagnosis present

## 2014-07-19 DIAGNOSIS — I872 Venous insufficiency (chronic) (peripheral): Secondary | ICD-10-CM | POA: Diagnosis present

## 2014-07-19 DIAGNOSIS — I509 Heart failure, unspecified: Secondary | ICD-10-CM | POA: Diagnosis not present

## 2014-07-19 DIAGNOSIS — I482 Chronic atrial fibrillation: Secondary | ICD-10-CM | POA: Diagnosis present

## 2014-07-19 DIAGNOSIS — E785 Hyperlipidemia, unspecified: Secondary | ICD-10-CM | POA: Diagnosis present

## 2014-07-19 DIAGNOSIS — R310 Gross hematuria: Secondary | ICD-10-CM | POA: Diagnosis present

## 2014-07-19 DIAGNOSIS — M8580 Other specified disorders of bone density and structure, unspecified site: Secondary | ICD-10-CM | POA: Diagnosis present

## 2014-07-19 DIAGNOSIS — I341 Nonrheumatic mitral (valve) prolapse: Secondary | ICD-10-CM | POA: Diagnosis present

## 2014-07-19 DIAGNOSIS — K219 Gastro-esophageal reflux disease without esophagitis: Secondary | ICD-10-CM | POA: Diagnosis present

## 2014-07-19 DIAGNOSIS — C679 Malignant neoplasm of bladder, unspecified: Secondary | ICD-10-CM | POA: Diagnosis present

## 2014-07-19 DIAGNOSIS — C678 Malignant neoplasm of overlapping sites of bladder: Secondary | ICD-10-CM

## 2014-07-19 DIAGNOSIS — Z8371 Family history of colonic polyps: Secondary | ICD-10-CM | POA: Diagnosis not present

## 2014-07-19 DIAGNOSIS — N329 Bladder disorder, unspecified: Secondary | ICD-10-CM | POA: Diagnosis present

## 2014-07-19 DIAGNOSIS — N289 Disorder of kidney and ureter, unspecified: Secondary | ICD-10-CM | POA: Diagnosis present

## 2014-07-19 HISTORY — PX: TRANSURETHRAL RESECTION OF BLADDER TUMOR WITH GYRUS (TURBT-GYRUS): SHX6458

## 2014-07-19 LAB — BASIC METABOLIC PANEL
Anion gap: 4 — ABNORMAL LOW (ref 5–15)
BUN: 52 mg/dL — ABNORMAL HIGH (ref 6–23)
CHLORIDE: 101 meq/L (ref 96–112)
CO2: 29 mmol/L (ref 19–32)
CREATININE: 1.8 mg/dL — AB (ref 0.50–1.10)
Calcium: 7.9 mg/dL — ABNORMAL LOW (ref 8.4–10.5)
GFR calc Af Amer: 29 mL/min — ABNORMAL LOW (ref >90)
GFR, EST NON AFRICAN AMERICAN: 25 mL/min — AB (ref >90)
Glucose, Bld: 150 mg/dL — ABNORMAL HIGH (ref 70–99)
Potassium: 5.5 mmol/L — ABNORMAL HIGH (ref 3.5–5.1)
SODIUM: 134 mmol/L — AB (ref 135–145)

## 2014-07-19 LAB — ABO/RH: ABO/RH(D): A POS

## 2014-07-19 LAB — HEMOGLOBIN AND HEMATOCRIT, BLOOD
HCT: 33.6 % — ABNORMAL LOW (ref 36.0–46.0)
Hemoglobin: 10.9 g/dL — ABNORMAL LOW (ref 12.0–15.0)

## 2014-07-19 LAB — PREPARE RBC (CROSSMATCH)

## 2014-07-19 SURGERY — TRANSURETHRAL RESECTION OF BLADDER TUMOR WITH GYRUS (TURBT-GYRUS)
Anesthesia: General

## 2014-07-19 MED ORDER — LIDOCAINE HCL (CARDIAC) 20 MG/ML IV SOLN
INTRAVENOUS | Status: AC
  Filled 2014-07-19: qty 5

## 2014-07-19 MED ORDER — FUROSEMIDE 20 MG PO TABS
20.0000 mg | ORAL_TABLET | Freq: Every day | ORAL | Status: DC
  Administered 2014-07-19: 20 mg via ORAL
  Filled 2014-07-19: qty 1

## 2014-07-19 MED ORDER — ROCURONIUM BROMIDE 100 MG/10ML IV SOLN
INTRAVENOUS | Status: DC | PRN
  Administered 2014-07-19: 20 mg via INTRAVENOUS
  Administered 2014-07-19: 5 mg via INTRAVENOUS

## 2014-07-19 MED ORDER — EPHEDRINE SULFATE 50 MG/ML IJ SOLN
INTRAMUSCULAR | Status: AC
  Filled 2014-07-19: qty 1

## 2014-07-19 MED ORDER — METOPROLOL TARTRATE 50 MG PO TABS
50.0000 mg | ORAL_TABLET | Freq: Once | ORAL | Status: AC
  Administered 2014-07-19: 50 mg via ORAL
  Filled 2014-07-19: qty 1

## 2014-07-19 MED ORDER — EPHEDRINE SULFATE 50 MG/ML IJ SOLN
INTRAMUSCULAR | Status: DC | PRN
  Administered 2014-07-19: 10 mg via INTRAVENOUS

## 2014-07-19 MED ORDER — FENTANYL CITRATE 0.05 MG/ML IJ SOLN
INTRAMUSCULAR | Status: AC
  Filled 2014-07-19: qty 2

## 2014-07-19 MED ORDER — KCL IN DEXTROSE-NACL 20-5-0.45 MEQ/L-%-% IV SOLN
INTRAVENOUS | Status: DC
  Administered 2014-07-19: 14:00:00 via INTRAVENOUS
  Filled 2014-07-19: qty 1000

## 2014-07-19 MED ORDER — FENTANYL CITRATE 0.05 MG/ML IJ SOLN
25.0000 ug | INTRAMUSCULAR | Status: DC | PRN
  Administered 2014-07-19: 12.5 ug via INTRAVENOUS

## 2014-07-19 MED ORDER — MEPERIDINE HCL 50 MG/ML IJ SOLN: 6.2500 mg | INTRAMUSCULAR | Status: DC | PRN

## 2014-07-19 MED ORDER — CIPROFLOXACIN IN D5W 200 MG/100ML IV SOLN
200.0000 mg | Freq: Once | INTRAVENOUS | Status: AC
  Administered 2014-07-20: 200 mg via INTRAVENOUS
  Filled 2014-07-19: qty 100

## 2014-07-19 MED ORDER — HYDROMORPHONE HCL 1 MG/ML IJ SOLN
0.5000 mg | INTRAMUSCULAR | Status: DC | PRN
  Administered 2014-07-20: 0.5 mg via INTRAVENOUS
  Administered 2014-07-20: 1 mg via INTRAVENOUS
  Filled 2014-07-19 (×2): qty 1

## 2014-07-19 MED ORDER — SODIUM CHLORIDE 0.9 % IR SOLN
Status: DC | PRN
  Administered 2014-07-19: 21000 mL via INTRAVESICAL

## 2014-07-19 MED ORDER — SENNA 8.6 MG PO TABS
1.0000 | ORAL_TABLET | Freq: Two times a day (BID) | ORAL | Status: DC
  Administered 2014-07-19 – 2014-07-22 (×5): 8.6 mg via ORAL
  Filled 2014-07-19 (×5): qty 1

## 2014-07-19 MED ORDER — HYDROCODONE-ACETAMINOPHEN 5-325 MG PO TABS: 1.0000 | ORAL_TABLET | ORAL | Status: DC | PRN

## 2014-07-19 MED ORDER — MIDAZOLAM HCL 5 MG/5ML IJ SOLN
INTRAMUSCULAR | Status: DC | PRN
  Administered 2014-07-19: 1 mg via INTRAVENOUS

## 2014-07-19 MED ORDER — FENTANYL CITRATE 0.05 MG/ML IJ SOLN
INTRAMUSCULAR | Status: DC | PRN
  Administered 2014-07-19 (×2): 50 ug via INTRAVENOUS

## 2014-07-19 MED ORDER — SODIUM CHLORIDE 0.9 % IV SOLN
INTRAVENOUS | Status: DC
  Administered 2014-07-19 – 2014-07-20 (×2): via INTRAVENOUS

## 2014-07-19 MED ORDER — ROSUVASTATIN CALCIUM 20 MG PO TABS
20.0000 mg | ORAL_TABLET | Freq: Every day | ORAL | Status: DC
  Administered 2014-07-19 – 2014-07-21 (×2): 20 mg via ORAL
  Filled 2014-07-19 (×4): qty 1

## 2014-07-19 MED ORDER — GENTAMICIN IN SALINE 1.6-0.9 MG/ML-% IV SOLN
80.0000 mg | Freq: Once | INTRAVENOUS | Status: AC
  Administered 2014-07-19: 80 mg via INTRAVENOUS
  Filled 2014-07-19: qty 50

## 2014-07-19 MED ORDER — SODIUM CHLORIDE 0.9 % IJ SOLN
INTRAMUSCULAR | Status: AC
  Filled 2014-07-19: qty 10

## 2014-07-19 MED ORDER — ONDANSETRON HCL 4 MG/2ML IJ SOLN
INTRAMUSCULAR | Status: DC | PRN
  Administered 2014-07-19: 4 mg via INTRAVENOUS

## 2014-07-19 MED ORDER — PROPOFOL 10 MG/ML IV BOLUS
INTRAVENOUS | Status: AC
Start: 2014-07-19 — End: 2014-07-19
  Filled 2014-07-19: qty 20

## 2014-07-19 MED ORDER — FUROSEMIDE 20 MG PO TABS
20.0000 mg | ORAL_TABLET | Freq: Two times a day (BID) | ORAL | Status: DC
  Administered 2014-07-20 – 2014-07-22 (×5): 20 mg via ORAL
  Filled 2014-07-19 (×5): qty 1

## 2014-07-19 MED ORDER — LACTATED RINGERS IV SOLN
INTRAVENOUS | Status: DC
  Administered 2014-07-19: 1000 mL via INTRAVENOUS

## 2014-07-19 MED ORDER — MECLIZINE HCL 12.5 MG PO TABS
12.5000 mg | ORAL_TABLET | Freq: Three times a day (TID) | ORAL | Status: DC | PRN
  Filled 2014-07-19 (×2): qty 1

## 2014-07-19 MED ORDER — LIDOCAINE HCL (CARDIAC) 20 MG/ML IV SOLN
INTRAVENOUS | Status: DC | PRN
  Administered 2014-07-19: 20 mg via INTRAVENOUS

## 2014-07-19 MED ORDER — METOPROLOL TARTRATE 50 MG PO TABS
50.0000 mg | ORAL_TABLET | Freq: Every morning | ORAL | Status: DC
  Administered 2014-07-19 – 2014-07-22 (×4): 50 mg via ORAL
  Filled 2014-07-19 (×4): qty 1

## 2014-07-19 MED ORDER — ONDANSETRON HCL 4 MG/2ML IJ SOLN
INTRAMUSCULAR | Status: AC
  Filled 2014-07-19: qty 2

## 2014-07-19 MED ORDER — PROPOFOL 10 MG/ML IV BOLUS
INTRAVENOUS | Status: DC | PRN
  Administered 2014-07-19: 100 mg via INTRAVENOUS
  Administered 2014-07-19: 50 mg via INTRAVENOUS

## 2014-07-19 MED ORDER — DOCUSATE SODIUM 100 MG PO CAPS
100.0000 mg | ORAL_CAPSULE | Freq: Two times a day (BID) | ORAL | Status: DC
  Administered 2014-07-19 – 2014-07-22 (×5): 100 mg via ORAL
  Filled 2014-07-19 (×7): qty 1

## 2014-07-19 MED ORDER — PROMETHAZINE HCL 25 MG/ML IJ SOLN: 6.2500 mg | INTRAMUSCULAR | Status: DC | PRN

## 2014-07-19 MED ORDER — DILTIAZEM HCL ER COATED BEADS 120 MG PO CP24
120.0000 mg | ORAL_CAPSULE | Freq: Every day | ORAL | Status: DC
  Administered 2014-07-19 – 2014-07-22 (×4): 120 mg via ORAL
  Filled 2014-07-19 (×4): qty 1

## 2014-07-19 MED ORDER — PHENYLEPHRINE HCL 10 MG/ML IJ SOLN
INTRAMUSCULAR | Status: DC | PRN
  Administered 2014-07-19 (×2): 80 ug via INTRAVENOUS

## 2014-07-19 MED ORDER — MIDAZOLAM HCL 2 MG/2ML IJ SOLN
INTRAMUSCULAR | Status: AC
  Filled 2014-07-19: qty 2

## 2014-07-19 MED ORDER — PHENYLEPHRINE 40 MCG/ML (10ML) SYRINGE FOR IV PUSH (FOR BLOOD PRESSURE SUPPORT)
PREFILLED_SYRINGE | INTRAVENOUS | Status: AC
  Filled 2014-07-19: qty 10

## 2014-07-19 SURGICAL SUPPLY — 34 items
BAG URINE DRAINAGE (UROLOGICAL SUPPLIES) ×4 IMPLANT
BAG URO CATCHER STRL LF (DRAPE) ×4 IMPLANT
BASKET LASER NITINOL 1.9FR (BASKET) IMPLANT
BASKET STNLS GEMINI 4WIRE 3FR (BASKET) IMPLANT
BASKET ZERO TIP NITINOL 2.4FR (BASKET) IMPLANT
CATH FOLEY 3WAY 30CC 22FR (CATHETERS) ×4 IMPLANT
CATH FOLEY 3WAY 30CC 26FR (CATHETERS) ×4 IMPLANT
CATH INTERMIT  6FR 70CM (CATHETERS) ×4 IMPLANT
CLOTH BEACON ORANGE TIMEOUT ST (SAFETY) ×4 IMPLANT
DRAPE CAMERA CLOSED 9X96 (DRAPES) ×4 IMPLANT
ELECT BUTTON HF 24-28F 2 30DE (ELECTRODE) ×4 IMPLANT
ELECT LOOP MED HF 24F 12D (CUTTING LOOP) ×4 IMPLANT
ELECT LOOP MED HF 24F 12D CBL (CLIP) IMPLANT
ELECT REM PT RETURN 9FT ADLT (ELECTROSURGICAL)
ELECT RESECT VAPORIZE 12D CBL (ELECTRODE) IMPLANT
ELECTRODE REM PT RTRN 9FT ADLT (ELECTROSURGICAL) IMPLANT
FIBER LASER FLEXIVA 200 (UROLOGICAL SUPPLIES) IMPLANT
FIBER LASER FLEXIVA 365 (UROLOGICAL SUPPLIES) IMPLANT
GLOVE BIOGEL M STRL SZ7.5 (GLOVE) ×4 IMPLANT
GOWN STRL REUS W/TWL LRG LVL3 (GOWN DISPOSABLE) ×4 IMPLANT
GOWN STRL REUS W/TWL XL LVL3 (GOWN DISPOSABLE) ×4 IMPLANT
GUIDEWIRE ANG ZIPWIRE 038X150 (WIRE) ×4 IMPLANT
GUIDEWIRE STR DUAL SENSOR (WIRE) ×4 IMPLANT
HOLDER FOLEY CATH W/STRAP (MISCELLANEOUS) IMPLANT
IV NS IRRIG 3000ML ARTHROMATIC (IV SOLUTION) ×8 IMPLANT
MANIFOLD NEPTUNE II (INSTRUMENTS) ×4 IMPLANT
PACK CYSTO (CUSTOM PROCEDURE TRAY) ×4 IMPLANT
PLUG CATH AND CAP STER (CATHETERS) ×4 IMPLANT
SYR 30ML LL (SYRINGE) IMPLANT
SYRINGE 10CC LL (SYRINGE) IMPLANT
SYRINGE IRR TOOMEY STRL 70CC (SYRINGE) ×4 IMPLANT
TUBE FEEDING 8FR 16IN STR KANG (MISCELLANEOUS) ×4 IMPLANT
TUBING CONNECTING 10 (TUBING) ×3 IMPLANT
TUBING CONNECTING 10' (TUBING) ×1

## 2014-07-19 NOTE — Anesthesia Preprocedure Evaluation (Addendum)
Anesthesia Evaluation  Patient identified by MRN, date of birth, ID band Patient awake    Reviewed: Allergy & Precautions, H&P , NPO status , Patient's Chart, lab work & pertinent test results  Airway Mallampati: II  TM Distance: >3 FB Neck ROM: Full    Dental no notable dental hx.    Pulmonary neg pulmonary ROS,  breath sounds clear to auscultation  Pulmonary exam normal       Cardiovascular + CAD and +CHF + dysrhythmias Atrial Fibrillation + Valvular Problems/Murmurs MVP Rhythm:Regular Rate:Normal     Neuro/Psych negative neurological ROS  negative psych ROS   GI/Hepatic negative GI ROS, Neg liver ROS,   Endo/Other  negative endocrine ROS  Renal/GU negative Renal ROS  negative genitourinary   Musculoskeletal negative musculoskeletal ROS (+)   Abdominal   Peds negative pediatric ROS (+)  Hematology negative hematology ROS (+)   Anesthesia Other Findings   Reproductive/Obstetrics negative OB ROS                            Anesthesia Physical Anesthesia Plan  ASA: III  Anesthesia Plan: General   Post-op Pain Management:    Induction: Intravenous  Airway Management Planned: LMA  Additional Equipment:   Intra-op Plan:   Post-operative Plan: Extubation in OR  Informed Consent: I have reviewed the patients History and Physical, chart, labs and discussed the procedure including the risks, benefits and alternatives for the proposed anesthesia with the patient or authorized representative who has indicated his/her understanding and acceptance.   Dental advisory given  Plan Discussed with: CRNA  Anesthesia Plan Comments:         Anesthesia Quick Evaluation

## 2014-07-19 NOTE — Anesthesia Postprocedure Evaluation (Signed)
  Anesthesia Post-op Note  Patient: Tracy [name]  Procedure(s) Performed: Procedure(s) (LRB): TRANSURETHRAL RESECTION OF BLADDER TUMOR WITH GYRUS (TURBT-GYRUS) WITH CYSTOGRAM (N/A)  Patient Location: PACU  Anesthesia Type: General  Level of Consciousness: awake and alert   Airway and Oxygen Therapy: Patient Spontanous Breathing  Post-op Pain: mild  Post-op Assessment: Post-op Vital signs reviewed, Patient's Cardiovascular Status Stable, Respiratory Function Stable, Patent Airway and No signs of Nausea or vomiting  Last Vitals:  Filed Vitals:   07/19/14 1350  BP: 104/47  Pulse: 62  Temp: 37.1 C  Resp: 12    Post-op Vital Signs: stable   Complications: No apparent anesthesia complications

## 2014-07-19 NOTE — Telephone Encounter (Signed)
Received request from Nurse fax box, documents faxed for surgical clearance. To: [facility] Fax number: [phone] Attention: 12.28.15 refaxed/KDM

## 2014-07-19 NOTE — Progress Notes (Signed)
Per report from day shift nurse patient HR dropped into 30's around 1800. Pt asymptomatic. Urology Rosalyn Gess, NP on call notified. No new orders placed. Patient  HR currently in 70's . Will continue to monitor closely.

## 2014-07-19 NOTE — Brief Op Note (Signed)
07/19/2014  12:07 PM  PATIENT:  Tracy [name]  80 y.o. female  PRE-OPERATIVE DIAGNOSIS:  large bladder mass  POST-OPERATIVE DIAGNOSIS:  large bladder mass  PROCEDURE:  Procedure(s): TRANSURETHRAL RESECTION OF BLADDER TUMOR WITH GYRUS (TURBT-GYRUS) WITH CYSTOGRAM (N/A) (FIRST STAGE)  SURGEON:  Surgeon(s) and Role:    * Alexis Frock, MD - Primary  PHYSICIAN ASSISTANT:   ASSISTANTS: none   ANESTHESIA:   general  EBL:     BLOOD ADMINISTERED:none  DRAINS: 48F 3-way foley to NS irrigation   LOCAL MEDICATIONS USED:  NONE  SPECIMEN:  Source of Specimen:  1 - bladder tumor, 2 - deeper bladder tumor, 3 - frozen section bladder tumor  DISPOSITION OF SPECIMEN:  PATHOLOGY  COUNTS:  YES  TOURNIQUET:  * No tourniquets in log *  DICTATION: .Other Dictation: Dictation Number   [id number]  PLAN OF CARE: Admit to inpatient   PATIENT DISPOSITION:  PACU - hemodynamically stable.   Delay start of Pharmacological VTE agent (>24hrs) due to surgical blood loss or risk of bleeding: yes

## 2014-07-19 NOTE — Transfer of Care (Signed)
Immediate Anesthesia Transfer of Care Note  Patient: Tracy [name]  Procedure(s) Performed: Procedure(s) (LRB): TRANSURETHRAL RESECTION OF BLADDER TUMOR WITH GYRUS (TURBT-GYRUS) WITH CYSTOGRAM (N/A)  Patient Location: PACU  Anesthesia Type: General  Level of Consciousness: sedated, patient cooperative and responds to stimulation  Airway & Oxygen Therapy: Patient Spontanous Breathing and Patient connected to face mask oxgen  Post-op Assessment: Report given to PACU RN and Post -op Vital signs reviewed and stable  Post vital signs: Reviewed and stable  Complications: No apparent anesthesia complications

## 2014-07-19 NOTE — H&P (Signed)
Tracy [name] is an 80 y.o. female.    Chief Complaint: Pre-OP Transurethral Resection Large Bladder Mass  HPI:   1  - Large Bladder Mass with Gross Hematuria - 8cm left wall bladder mass-clot conglomerate by ER CT 06/2014 on eval gross hematuria. No obvious extravesical disease, no hydro.  2 - Renal Insuficency - Cr 1.5-2.5 since 2014 by hospital labs. No hyperkalemia. CT 2015 w/o hydro or stones.  PMH sig for CAD/AFib/Coumadin (follows Darlina Guys, cards, no prior DVT/PE). Her PCP is Angus McGuiness MD. Tracy [name] is very independent, lives alone, and still driving at baseline. Her daugheter Tracy [name] is also very infolved at [phone].  Today Tracy [name] is seen to proceed with transurethral resection of bladder tumor for diagnostic and theraputic intent. She has been off coumadin as per her cardiologist and most recent INR approx 1.0.   Past Medical History  Diagnosis Date  . Hyperlipidemia   . Coronary artery disease   . Venous insufficiency   . CHF (congestive heart failure)     acute diastolic heart failure  . Arrhythmia     chronic afib  . GERD (gastroesophageal reflux disease)   . Mitral valve prolapse   . Shoulder pain, right   . Swelling     both legs and feet  . Shortness of breath dyspnea   . Headache     occasional headache  . Arthritis   . Sleeping difficulties     Past Surgical History  Procedure Laterality Date  . Colonoscopy N/A 09/30/2012    Procedure: COLONOSCOPY;  Surgeon: Rogene Houston, MD;  Location: [facility];  Service: Endoscopy;  Laterality: N/A;  930  . Cardioversion      x2  . Back surgery      x2-lower back    Family History  Problem Relation Age of Onset  . Colon cancer Neg Hx   . Colon polyps Sister    Social History:  reports that she has never smoked. She has never used smokeless tobacco. She reports that she does not drink alcohol or use illicit drugs.  Allergies:  Allergies  Allergen Reactions  . Amoxicillin Other (See Comments)     Unknown  . Penicillins Other (See Comments)    Unknown  . Sulfonamide Derivatives Other (See Comments)    Unknown    No prescriptions prior to admission    Results for orders placed or performed during the hospital encounter of 07/18/14 (from the past 48 hour(s))  Protime-INR     Status: None   Collection Time: 07/18/14  9:10 AM  Result Value Ref Range   Prothrombin Time 13.1 11.6 - 15.2 seconds   INR 0.98 0.00 - 1.49  APTT     Status: None   Collection Time: 07/18/14  9:10 AM  Result Value Ref Range   aPTT 26 24 - 37 seconds  Basic metabolic panel     Status: Abnormal   Collection Time: 07/18/14  9:10 AM  Result Value Ref Range   Sodium 132 (L) 135 - 145 mmol/L    Comment: Please note change in reference range.   Potassium 5.8 (H) 3.5 - 5.1 mmol/L    Comment: Please note change in reference range.   Chloride 100 96 - 112 mEq/L   CO2 28 19 - 32 mmol/L   Glucose, Bld 117 (H) 70 - 99 mg/dL   BUN 61 (H) 6 - 23 mg/dL   Creatinine, Ser 2.12 (H) 0.50 - 1.10 mg/dL   Calcium  8.4 8.4 - 10.5 mg/dL   GFR calc non Af Amer 20 (L) >90 mL/min   GFR calc Af Amer 23 (L) >90 mL/min    Comment: (NOTE) The eGFR has been calculated using the CKD EPI equation. This calculation has not been validated in all clinical situations. eGFR's persistently <90 mL/min signify possible Chronic Kidney Disease.    Anion gap 4 (L) 5 - 15  CBC     Status: Abnormal   Collection Time: 07/18/14  9:10 AM  Result Value Ref Range   WBC 6.5 4.0 - 10.5 K/uL   RBC 3.94 3.87 - 5.11 MIL/uL   Hemoglobin 12.6 12.0 - 15.0 g/dL   HCT 39.6 36.0 - 46.0 %   MCV 100.5 (H) 78.0 - 100.0 fL   MCH 32.0 26.0 - 34.0 pg   MCHC 31.8 30.0 - 36.0 g/dL   RDW 16.0 (H) 11.5 - 15.5 %   Platelets 229 150 - 400 K/uL   Dg Chest 2 View  07/18/2014   CLINICAL DATA:  Bladder resection.  EXAM: CHEST  2 VIEW  COMPARISON:  08/08/2008.  FINDINGS: Mediastinum hilar structures normal. Stable changes of pleural scarring noted on the left.  No focal pulmonary infiltrate. No pleural effusion or pneumothorax. Heart size normal. No acute bony abnormality. Diffuse osteopenia degenerative change.  IMPRESSION: 1. Stable left pleural scarring. 2. No acute cardiopulmonary disease.   Electronically Signed   By: Marcello Moores  Register   On: 07/18/2014 09:39    Review of Systems  Constitutional: Negative.  Negative for fever, chills and weight loss.  HENT: Negative.   Eyes: Negative.   Respiratory: Negative.   Cardiovascular: Negative.   Gastrointestinal: Negative.   Genitourinary: Positive for hematuria. Negative for flank pain.  Musculoskeletal: Negative.   Skin: Negative.   Neurological: Negative.   Endo/Heme/Allergies: Negative.   Psychiatric/Behavioral: Negative.     There were no vitals taken for this visit. Physical Exam  Constitutional: She appears well-developed.  Vigorous for age  HENT:  Head: Normocephalic.  Eyes: Pupils are equal, round, and reactive to light.  Neck: Normal range of motion.  Cardiovascular: Normal rate.   Respiratory: Effort normal.  GI: Soft.  Genitourinary:  No CVAT  Musculoskeletal: Normal range of motion.  Neurological: She is alert.  Skin: Skin is warm.  Psychiatric: She has a normal mood and affect. Her behavior is normal. Judgment and thought content normal.     Assessment/Plan    1  - Large Bladder Mass with Gross Hematuria - Needs endoscopic resection for initial diagnostic and theraputic step.   We rediscussed operative biopsy / transurethral resection as the best next step for diagnostic and therapeutic purposes with goals being to remove all visible cancer and obtain tissue for pathologic exam. We rediscussed that for some low-grade tumors, this may be all the treatment required, but that for many other tumors such as high-grade lesions, further therapy including surgery and or chemotherapy may be warranted. We also reoutlined the fact that any bladder cancer diagnosis will require close  follow-up with periodic upper and lower tract evaluation. We rediscussed risks including bleeding, infection, damage to kidney / ureter / bladder including bladder perforation which can typically managed with prolonged foley catheterization. We rementioned anesthetic and other rare risks including DVT, PE, MI, and mortality. I alsore mentioned that adjunctive procedures such as ureteral stenting, retrograde pyelography, and ureteroscopy may be necessary to fully evaluate the urinary tract depending on intra-operative findings.   After answering all questions to  the patient's satisfaction, they wish to proceed today as planned.  2 - Renal Insufficiency - likely medical renal disease. Will plan for retrogrades at time of TURBT to verify no interval hydro / need for stets.  Tracy [name] 07/19/2014, 6:38 AM

## 2014-07-19 NOTE — Progress Notes (Signed)
S: POD 0 s/p first stage transurethral resection massive bladder tumor. Frozen section favors low-grade.  O: NAD, family at bedside AOx3 SNTND RRR Foley c/d/i with pink urine on NS irrigation at 1gtt per second  Hgb >10 Cr 1.8, K 5.5 (improved some)  A/P: Discussed situation with pt and family as well as reccomended path of 2nd stage TURBT tomorrow as tumor likley low-grade and pt NOT cystectomy candidate and does not want cystectomy. Mentioned that may even require 3rd stage. T+C blood to have available as she will likely lose more before stabilized, though no s/s anemia now. Hyperkalemia noted, changed IVF to NS only, continue lasix, will change to BID to help as well as she likely will also have some systemic volume absorption with lengthy TUR procedures.  Risks reiterated and pt / family voiced understanding and approval of current plan. NPO p MN.

## 2014-07-20 ENCOUNTER — Encounter (HOSPITAL_COMMUNITY)
Admission: RE | Disposition: A | Discharge status: Home / Self Care | Payer: Self-pay | Source: Ambulatory Visit | Attending: Urology

## 2014-07-20 ENCOUNTER — Encounter (HOSPITAL_COMMUNITY): Payer: Self-pay | Admitting: Urology

## 2014-07-20 ENCOUNTER — Inpatient Hospital Stay (HOSPITAL_COMMUNITY): Payer: Medicare Other | Admitting: Anesthesiology

## 2014-07-20 HISTORY — PX: TRANSURETHRAL RESECTION OF BLADDER TUMOR WITH GYRUS (TURBT-GYRUS): SHX6458

## 2014-07-20 LAB — HEMOGLOBIN AND HEMATOCRIT, BLOOD
HCT: 31.6 % — ABNORMAL LOW (ref 36.0–46.0)
HEMATOCRIT: 22.4 % — AB (ref 36.0–46.0)
Hemoglobin: 10.2 g/dL — ABNORMAL LOW (ref 12.0–15.0)
Hemoglobin: 7.3 g/dL — ABNORMAL LOW (ref 12.0–15.0)

## 2014-07-20 LAB — POCT I-STAT 4, (NA,K, GLUC, HGB,HCT)
GLUCOSE: 136 mg/dL — AB (ref 70–99)
GLUCOSE: 130 mg/dL — AB (ref 70–99)
HCT: 23 % — ABNORMAL LOW (ref 36.0–46.0)
HEMATOCRIT: 30 % — AB (ref 36.0–46.0)
Hemoglobin: 10.2 g/dL — ABNORMAL LOW (ref 12.0–15.0)
Hemoglobin: 7.8 g/dL — ABNORMAL LOW (ref 12.0–15.0)
POTASSIUM: 4.8 mmol/L (ref 3.5–5.1)
POTASSIUM: 5.3 mmol/L — AB (ref 3.5–5.1)
Sodium: 134 mmol/L — ABNORMAL LOW (ref 135–145)
Sodium: 135 mmol/L (ref 135–145)

## 2014-07-20 LAB — COMPREHENSIVE METABOLIC PANEL
ALT: 15 U/L (ref 0–35)
ANION GAP: 3 — AB (ref 5–15)
AST: 16 U/L (ref 0–37)
Albumin: 1.9 g/dL — ABNORMAL LOW (ref 3.5–5.2)
Alkaline Phosphatase: 42 U/L (ref 39–117)
BUN: 52 mg/dL — ABNORMAL HIGH (ref 6–23)
CALCIUM: 7.6 mg/dL — AB (ref 8.4–10.5)
CHLORIDE: 102 meq/L (ref 96–112)
CO2: 27 mmol/L (ref 19–32)
Creatinine, Ser: 2.12 mg/dL — ABNORMAL HIGH (ref 0.50–1.10)
GFR calc Af Amer: 23 mL/min — ABNORMAL LOW (ref >90)
GFR, EST NON AFRICAN AMERICAN: 20 mL/min — AB (ref >90)
GLUCOSE: 106 mg/dL — AB (ref 70–99)
Potassium: 5.2 mmol/L — ABNORMAL HIGH (ref 3.5–5.1)
SODIUM: 132 mmol/L — AB (ref 135–145)
Total Bilirubin: 0.5 mg/dL (ref 0.3–1.2)
Total Protein: 3.7 g/dL — ABNORMAL LOW (ref 6.0–8.3)

## 2014-07-20 LAB — BASIC METABOLIC PANEL
ANION GAP: 4 — AB (ref 5–15)
BUN: 39 mg/dL — ABNORMAL HIGH (ref 6–23)
CHLORIDE: 109 meq/L (ref 96–112)
CO2: 22 mmol/L (ref 19–32)
Calcium: 6.9 mg/dL — ABNORMAL LOW (ref 8.4–10.5)
Creatinine, Ser: 1.64 mg/dL — ABNORMAL HIGH (ref 0.50–1.10)
GFR, EST AFRICAN AMERICAN: 32 mL/min — AB (ref >90)
GFR, EST NON AFRICAN AMERICAN: 28 mL/min — AB (ref >90)
Glucose, Bld: 133 mg/dL — ABNORMAL HIGH (ref 70–99)
Potassium: 5.3 mmol/L — ABNORMAL HIGH (ref 3.5–5.1)
Sodium: 135 mmol/L (ref 135–145)

## 2014-07-20 LAB — MRSA PCR SCREENING: MRSA by PCR: NEGATIVE

## 2014-07-20 SURGERY — TRANSURETHRAL RESECTION OF BLADDER TUMOR WITH GYRUS (TURBT-GYRUS)
Anesthesia: General

## 2014-07-20 MED ORDER — GLYCOPYRROLATE 0.2 MG/ML IJ SOLN
INTRAMUSCULAR | Status: AC
  Filled 2014-07-20: qty 2

## 2014-07-20 MED ORDER — EPHEDRINE SULFATE 50 MG/ML IJ SOLN
INTRAMUSCULAR | Status: AC
  Filled 2014-07-20: qty 1

## 2014-07-20 MED ORDER — ONDANSETRON HCL 4 MG/2ML IJ SOLN
INTRAMUSCULAR | Status: AC
  Filled 2014-07-20: qty 2

## 2014-07-20 MED ORDER — EPHEDRINE SULFATE 50 MG/ML IJ SOLN
INTRAMUSCULAR | Status: DC | PRN
  Administered 2014-07-20: 10 mg via INTRAVENOUS
  Administered 2014-07-20: 5 mg via INTRAVENOUS

## 2014-07-20 MED ORDER — SODIUM CHLORIDE 0.9 % IV SOLN: Freq: Once | INTRAVENOUS | Status: DC

## 2014-07-20 MED ORDER — NEOSTIGMINE METHYLSULFATE 10 MG/10ML IV SOLN
INTRAVENOUS | Status: AC
  Filled 2014-07-20: qty 1

## 2014-07-20 MED ORDER — LACTATED RINGERS IV SOLN: INTRAVENOUS | Status: DC

## 2014-07-20 MED ORDER — ONDANSETRON HCL 4 MG/2ML IJ SOLN
INTRAMUSCULAR | Status: DC | PRN
  Administered 2014-07-20: 4 mg via INTRAVENOUS

## 2014-07-20 MED ORDER — CALCIUM CITRATE-VITAMIN D 500-400 MG-UNIT PO CHEW
1.0000 | CHEWABLE_TABLET | Freq: Every day | ORAL | Status: DC
  Administered 2014-07-20 – 2014-07-22 (×3): 1 via ORAL
  Filled 2014-07-20 (×4): qty 1

## 2014-07-20 MED ORDER — PROPOFOL 10 MG/ML IV BOLUS
INTRAVENOUS | Status: DC | PRN
  Administered 2014-07-20: 130 mg via INTRAVENOUS

## 2014-07-20 MED ORDER — PHENYLEPHRINE 40 MCG/ML (10ML) SYRINGE FOR IV PUSH (FOR BLOOD PRESSURE SUPPORT)
PREFILLED_SYRINGE | INTRAVENOUS | Status: AC
  Filled 2014-07-20: qty 10

## 2014-07-20 MED ORDER — LACTATED RINGERS IV SOLN
INTRAVENOUS | Status: DC | PRN
  Administered 2014-07-20 (×2): via INTRAVENOUS

## 2014-07-20 MED ORDER — SODIUM CHLORIDE 0.9 % IR SOLN
Status: DC | PRN
  Administered 2014-07-20: 72000 mL via INTRAVESICAL

## 2014-07-20 MED ORDER — ROCURONIUM BROMIDE 100 MG/10ML IV SOLN
INTRAVENOUS | Status: DC | PRN
  Administered 2014-07-20: 15 mg via INTRAVENOUS

## 2014-07-20 MED ORDER — ONDANSETRON HCL 4 MG/2ML IJ SOLN
4.0000 mg | INTRAMUSCULAR | Status: DC | PRN
  Administered 2014-07-20: 4 mg via INTRAVENOUS
  Filled 2014-07-20: qty 2

## 2014-07-20 MED ORDER — ONDANSETRON HCL 4 MG/2ML IJ SOLN
4.0000 mg | Freq: Once | INTRAMUSCULAR | Status: AC
  Administered 2014-07-20: 4 mg via INTRAVENOUS

## 2014-07-20 MED ORDER — SUCCINYLCHOLINE CHLORIDE 20 MG/ML IJ SOLN
INTRAMUSCULAR | Status: DC | PRN
  Administered 2014-07-20: 80 mg via INTRAVENOUS

## 2014-07-20 MED ORDER — FENTANYL CITRATE 0.05 MG/ML IJ SOLN
INTRAMUSCULAR | Status: DC | PRN
  Administered 2014-07-20 (×2): 50 ug via INTRAVENOUS

## 2014-07-20 MED ORDER — LIDOCAINE HCL (CARDIAC) 20 MG/ML IV SOLN
INTRAVENOUS | Status: DC | PRN
  Administered 2014-07-20: 50 mg via INTRAVENOUS

## 2014-07-20 MED ORDER — SODIUM CHLORIDE 0.9 % IV BOLUS (SEPSIS)
500.0000 mL | Freq: Once | INTRAVENOUS | Status: AC
  Administered 2014-07-20: 500 mL via INTRAVENOUS

## 2014-07-20 MED ORDER — LACTATED RINGERS IV SOLN
INTRAVENOUS | Status: DC | PRN
  Administered 2014-07-20: 16:00:00 via INTRAVENOUS

## 2014-07-20 MED ORDER — SODIUM CHLORIDE 0.9 % IV SOLN
1.0000 g | Freq: Once | INTRAVENOUS | Status: AC
  Administered 2014-07-20: 1 g via INTRAVENOUS
  Filled 2014-07-20: qty 10

## 2014-07-20 MED ORDER — FENTANYL CITRATE 0.05 MG/ML IJ SOLN
25.0000 ug | INTRAMUSCULAR | Status: DC | PRN
Start: 2014-07-20 — End: 2014-07-20

## 2014-07-20 MED ORDER — PHENYLEPHRINE HCL 10 MG/ML IJ SOLN
INTRAMUSCULAR | Status: DC | PRN
  Administered 2014-07-20: 80 ug via INTRAVENOUS
  Administered 2014-07-20: 40 ug via INTRAVENOUS
  Administered 2014-07-20: 80 ug via INTRAVENOUS

## 2014-07-20 MED ORDER — PROPOFOL 10 MG/ML IV BOLUS
INTRAVENOUS | Status: AC
Start: 2014-07-20 — End: 2014-07-20
  Filled 2014-07-20: qty 20

## 2014-07-20 MED ORDER — SODIUM CHLORIDE 0.9 % IJ SOLN
INTRAMUSCULAR | Status: AC
  Filled 2014-07-20: qty 10

## 2014-07-20 MED ORDER — PHENYLEPHRINE HCL 10 MG/ML IJ SOLN
10.0000 mg | INTRAVENOUS | Status: DC | PRN
  Administered 2014-07-20: 50 ug/min via INTRAVENOUS

## 2014-07-20 MED ORDER — NEOSTIGMINE METHYLSULFATE 10 MG/10ML IV SOLN
INTRAVENOUS | Status: DC | PRN
  Administered 2014-07-20: 2 mg via INTRAVENOUS

## 2014-07-20 MED ORDER — 0.9 % SODIUM CHLORIDE (POUR BTL) OPTIME
TOPICAL | Status: DC | PRN
  Administered 2014-07-20: 2000 mL

## 2014-07-20 MED ORDER — GLYCOPYRROLATE 0.2 MG/ML IJ SOLN
INTRAMUSCULAR | Status: DC | PRN
  Administered 2014-07-20: 0.4 mg via INTRAVENOUS

## 2014-07-20 MED ORDER — DIATRIZOATE MEGLUMINE 30 % UR SOLN
URETHRAL | Status: DC | PRN
  Administered 2014-07-20: 100 mL

## 2014-07-20 SURGICAL SUPPLY — 21 items
BAG URINE DRAINAGE (UROLOGICAL SUPPLIES) IMPLANT
BAG URO CATCHER STRL LF (DRAPE) ×3 IMPLANT
CATH FOLEY 3WAY 30CC 22FR (CATHETERS) IMPLANT
CATH FOLEY 3WAY 30CC 26FR (CATHETERS) ×3 IMPLANT
DRAPE CAMERA CLOSED 9X96 (DRAPES) ×3 IMPLANT
ELECT BUTTON HF 24-28F 2 30DE (ELECTRODE) ×3 IMPLANT
ELECT LOOP MED HF 24F 12D (CUTTING LOOP) IMPLANT
ELECT LOOP MED HF 24F 12D CBL (CLIP) ×3 IMPLANT
ELECT RESECT VAPORIZE 12D CBL (ELECTRODE) IMPLANT
GLOVE BIOGEL M STRL SZ7.5 (GLOVE) ×3 IMPLANT
GOWN STRL REUS W/TWL XL LVL3 (GOWN DISPOSABLE) ×3 IMPLANT
HOLDER FOLEY CATH W/STRAP (MISCELLANEOUS) IMPLANT
IV NS IRRIG 3000ML ARTHROMATIC (IV SOLUTION) IMPLANT
KIT ASPIRATION TUBING (SET/KITS/TRAYS/PACK) ×3 IMPLANT
MANIFOLD NEPTUNE II (INSTRUMENTS) ×3 IMPLANT
PACK CYSTO (CUSTOM PROCEDURE TRAY) ×3 IMPLANT
PLUG CATH AND CAP STER (CATHETERS) ×3 IMPLANT
SYR 30ML LL (SYRINGE) ×3 IMPLANT
SYRINGE IRR TOOMEY STRL 70CC (SYRINGE) ×3 IMPLANT
TUBING CONNECTING 10 (TUBING) ×2 IMPLANT
TUBING CONNECTING 10' (TUBING) ×1

## 2014-07-20 NOTE — Progress Notes (Signed)
Patient bladder distended.  Patient complaining of pain of 10/10 when palpated. Complaining of constant pressure.  Foley Irrigated per order from Dr Cathlean Cower.  Irrigation 1108ml instilled. 1489ml returned.  Patient was relieved from pressure and pain.  Pain level now is 0/10.

## 2014-07-20 NOTE — Anesthesia Postprocedure Evaluation (Signed)
  Anesthesia Post-op Note  Patient: Tracy [name]  Procedure(s) Performed: Procedure(s) (LRB): SECOND LOOK TRANSURETHRAL RESECTION OF BLADDER TUMOR WITH GYRUS (TURBT-GYRUS) (N/A)  Patient Location: PACU  Anesthesia Type: General  Level of Consciousness: awake and alert   Airway and Oxygen Therapy: Patient Spontanous Breathing  Post-op Pain: mild  Post-op Assessment: Post-op Vital signs reviewed, Patient's Cardiovascular Status Stable, Respiratory Function Stable, Patent Airway and No signs of Nausea or vomiting  Last Vitals:  Filed Vitals:   07/20/14 1700  BP: 118/57  Pulse: 85  Temp:   Resp: 23    Post-op Vital Signs: stable   Complications: No apparent anesthesia complications

## 2014-07-20 NOTE — Anesthesia Preprocedure Evaluation (Addendum)
Anesthesia Evaluation  Patient identified by MRN, date of birth, ID band Patient awake    Reviewed: Allergy & Precautions, H&P , NPO status , Patient's Chart, lab work & pertinent test results  Airway Mallampati: II  TM Distance: >3 FB Neck ROM: Full    Dental no notable dental hx.    Pulmonary neg pulmonary ROS, shortness of breath and with exertion,  breath sounds clear to auscultation  Pulmonary exam normal       Cardiovascular hypertension, Pt. on home beta blockers and Pt. on medications + CAD and +CHF + dysrhythmias Atrial Fibrillation + Valvular Problems/Murmurs MVP Rhythm:Irregular Rate:Normal  Acute diastolic heart failure. Chronic AF. ECG AF   Neuro/Psych  Headaches, negative neurological ROS  negative psych ROS   GI/Hepatic negative GI ROS, Neg liver ROS,   Endo/Other  negative endocrine ROS  Renal/GU negative Renal ROS  negative genitourinary   Musculoskeletal negative musculoskeletal ROS (+)   Abdominal   Peds negative pediatric ROS (+)  Hematology negative hematology ROS (+) anemia , hgb 10.2   Anesthesia Other Findings   Reproductive/Obstetrics negative OB ROS                             Anesthesia Physical Anesthesia Plan  ASA: III  Anesthesia Plan: General   Post-op Pain Management:    Induction: Intravenous  Airway Management Planned: Oral ETT  Additional Equipment:   Intra-op Plan:   Post-operative Plan: Extubation in OR  Informed Consent:   Plan Discussed with: Surgeon  Anesthesia Plan Comments:        Anesthesia Quick Evaluation

## 2014-07-20 NOTE — Transfer of Care (Signed)
Immediate Anesthesia Transfer of Care Note  Patient: Tracy [name]  Procedure(s) Performed: Procedure(s): SECOND LOOK TRANSURETHRAL RESECTION OF BLADDER TUMOR WITH GYRUS (TURBT-GYRUS) (N/A)  Patient Location: PACU  Anesthesia Type:General  Level of Consciousness: awake, alert  and oriented  Airway & Oxygen Therapy: Patient Spontanous Breathing and Patient connected to face mask oxygen  Post-op Assessment: Report given to PACU RN and Post -op Vital signs reviewed and stable  Post vital signs: Reviewed and stable  Complications: No apparent anesthesia complications

## 2014-07-20 NOTE — Brief Op Note (Signed)
07/19/2014 - 07/20/2014  2:56 PM  PATIENT:  Tracy [name]  80 y.o. female  PRE-OPERATIVE DIAGNOSIS: residual massive bladder tumor  POST-OPERATIVE DIAGNOSIS:  bladder tumor  PROCEDURE:  Procedure(s): SECOND LOOK TRANSURETHRAL RESECTION OF BLADDER TUMOR WITH GYRUS (TURBT-GYRUS) (N/A)  SURGEON:  Surgeon(s) and Role:    * Alexis Frock, MD - Primary  PHYSICIAN ASSISTANT:   ASSISTANTS: none   ANESTHESIA:   general  EBL:  Total I/O In: 2180 [I.V.:1000; Other:1080; IV Piggyback:100] Out: 4270 [Urine:3450]  BLOOD ADMINISTERED:none  DRAINS: 6F 3 way foley to NS irrigation   LOCAL MEDICATIONS USED:  NONE  SPECIMEN:  Source of Specimen:  Bladder Tumor, Deeper resection of bladder tumor  DISPOSITION OF SPECIMEN:  PATHOLOGY  COUNTS:  YES  TOURNIQUET:  * No tourniquets in log *  DICTATION: .Other Dictation: Dictation Number [id number]  PLAN OF CARE: Admit to inpatient   PATIENT DISPOSITION:  PACU - hemodynamically stable.   Delay start of Pharmacological VTE agent (>24hrs) due to surgical blood loss or risk of bleeding: yes

## 2014-07-20 NOTE — Progress Notes (Signed)
Nutrition Brief Note  Patient identified on the Malnutrition Screening Tool (MST) Report  Wt Readings from Last 15 Encounters:  07/19/14 133 lb (60.328 kg)  07/18/14 133 lb (60.328 kg)  06/25/14 137 lb (62.143 kg)  05/26/14 130 lb 1.9 oz (59.022 kg)  11/04/13 140 lb (63.504 kg)  03/24/13 138 lb (62.596 kg)  09/30/12 145 lb (65.772 kg)  09/17/12 145 lb (65.772 kg)  03/05/12 147 lb (66.679 kg)  09/02/11 144 lb 12.8 oz (65.681 kg)  04/03/11 149 lb (67.586 kg)  10/04/10 152 lb (68.947 kg)  04/10/10 149 lb (67.586 kg)  09/21/09 153 lb (69.4 kg)  09/21/08 152 lb (68.947 kg)    Body mass index is 23.57 kg/(m^2). Patient meets criteria for Normal weight based on current BMI.   Current diet order is NPO for procedure. On Regular diet, patient was consuming approximately 100% of meals at this time. Labs and medications reviewed.   Pt not in room during time of RD assessment. Previous medical records indicate a 4 lb weight loss in past one month, and 7 lb wt loss in past 8 months; both of which are non-significant for time frame. RN documentation indicates pt with good appetite, and consuming 100% of meals.  No nutrition interventions warranted at this time. If nutrition issues arise, please consult RD.   Atlee Abide MS RD LDN Clinical Dietitian [phone]

## 2014-07-20 NOTE — Clinical Documentation Improvement (Signed)
Please clarify if "Renal Insufficiency" in setting of abnormal BUN/Cr/GFR=52/2.12/20 and document on the Assessment and plan, pn or d/c summary   Possible Clinical Conditions?   Acute Renal Failure/Acute Kidney Injury _______CKD Stage I - GFR > OR = 90 _______CKD Stage II - GFR 60-80 _______CKD Stage III - GFR 30-59 _______CKD Stage IV - GFR 15-29 _______CKD Stage V - GFR < 15 _______ESRD (End Stage Renal Disease) Other Condition Cannot Clinically Determine   Supporting Information: Risk Factors: Bladder mass, TRANSURETHRAL RESECTION OF BLADDER TUMOR WITH GYRUS (TURBT-GYRUS) WITH CYSTOGRAM (N/A) (FIRST STAGE) Signs and Symptoms: Diagnostics: Component      BUN Creatinine  Latest Ref Rng      6 - 23 mg/dL 0.50 - 1.10 mg/dL  07/19/2014     6:30 PM 52 (H) 1.80 (H)   Component      Creatinine Calcium  Latest Ref Rng      0.50 - 1.10 mg/dL 8.4 - 10.5 mg/dL  07/20/2014      2.12 (H) 7.6 (L)   Component      GFR calc non Af Amer  Latest Ref Rng      >90 mL/min  07/19/2014     6:30 PM 25 (L)   Component      GFR calc non Af Amer  Latest Ref Rng      >90 mL/min  07/20/2014      20 (L)   Treatments: Monitoring IV NS  Lasix  Thank You,  Heloise Beecham ,RN Clinical Documentation Specialist:  [place] [employer]

## 2014-07-20 NOTE — Op Note (Signed)
Tracy [name], Tracy [name]              ACCOUNT NO.:  [id number]  MEDICAL RECORD NO.:  [id number]  LOCATION:  5573                         FACILITY:  [facility]  PHYSICIAN:  Alexis Frock, MD     DATE OF BIRTH:  12/30/1927  DATE OF PROCEDURE: 07/19/2014                                OPERATIVE REPORT   DIAGNOSIS:  Massive bladder tumor.  PROCEDURE: 1. First stage transurethral resection of bladder tumor, volume large. 2. Cystogram with interpretation.  ESTIMATED BLOOD LOSS:  100 mL.  COMPLICATIONS:  None.  SPECIMEN: 1. Bladder tumor fragments. 2. Deeper bladder tumor fragments. 3. Frozen section of bladder tumor, negative for overt high-grade     features.  FINDINGS: 1. Massive amounts of bladder tumor occupying approximately 75% of     volume of the bladder.  This was estimated to be at least 15 cm3. 2. No evidence of bladder perforation via cystogram at the conclusion     of resection. 3. Successful resection of approximately 50% of volume of total tumor.  INDICATION:  Tracy [name] is a pleasant 80 year old lady who on workup for gross hematuria was found to have a massive amount of bladder tumor and/or clot in the urinary bladder via imaging.  There appeared to be no distant disease.  Options were discussed including transurethral resection of bladder tumor for initial diagnostic and staging purposes. She wished to proceed.  Informed consent was obtained and placed in medical record.  The patient has been on chronic Coumadin, she has been off this under the direction of her cardiologist.  PROCEDURE IN DETAIL:  The patient being Tracy [name] being verified. Procedure being transurethral resection of bladder tumor likely, 1st stage was confirmed.  Procedure was carried out.  Time-out was performed.  Intravenous antibiotics were administered.  General LMA anesthesia was induced.  The patient was placed into a low lithotomy position.  Sterile field was created by prepping and  draping the patient's vagina, introitus, and proximal thighs using iodine x3.  Next, cystourethroscopy was performed using a 22-French rigid cystoscope with 12-degree offset lens.  Inspection of the urinary bladder revealed a massive amount of papillary bladder tumor emanating from the left wall. This occupied approximately 75% of the total volume of the bladder.  The stalk appeared to be quite wide.  Ureteral orifices were difficult to appreciate due to the bladder tumor, but it was felt that the tumor was not directly involving the structures.  Cystoscope was exchanged for a 26-French [facility] continuous flow resectoscope sheath and using bipolar loop with normal saline irrigation.  Very careful systematic resection was performed of the bladder tumor for approximately 2 hours.  This resulted in removal of approximately 50% of the volume of the tumor.  The majority of this resected volume was set aside for permanent pathology, labeled bladder tumor.  Following the contour of the bladder wall on a top-down orientation, portions of the stalk were also resected.  This set aside, labeled deeper bladder tumor, a portion of which was sent for frozen section analysis and found to be most consistent with low grade features given patient's age and massive volume of the tumor which is somewhat disorienting during resection.  It  was felt the most prudent means to proceed would be a likely staged approach, the 2nd stage transurethral resection of bladder tumor possibly as soon as tomorrow depending on patient's clinical status with interval bladder irrigations yielded hemoglobins.  As such, a new 26-French 3-way Foley catheter was placed per urethra to straight drain, 25 mL of sterile water in the balloon. This makes normal saline irrigation 1 drop per second and the efflux was found to be light pink, confirming excellent hemostasis.  Via this, catheter cystogram was obtained.  A 210 mL of  cysto-compatible contrast was instilled under gravity to urinary bladder and spot fluoroscopic images revealed excellent bladder contour, smooth wall with some distortion as expected on the left side given the stalk of the tumor in this location.  Drainage films revealed no evidence of perforation.  Again, this was left to normal saline irrigation.  Procedure was then terminated.  The patient tolerated the procedure well.  There were no immediate periprocedural complications. The patient was taken to postanesthesia care unit in stable condition.          ______________________________ Alexis Frock, MD     TM/MEDQ  D:  07/19/2014  T:  07/19/2014  Job:  [id number]

## 2014-07-20 NOTE — Progress Notes (Signed)
Patient's BP 89/43. Patient asymptomatic. Rosalyn Gess, NP notified. 500 cc NS bolus ordered. Bolus infusing. Will recheck BP in one hour.

## 2014-07-21 LAB — TYPE AND SCREEN
ABO/RH(D): A POS
ANTIBODY SCREEN: NEGATIVE
Unit division: 0
Unit division: 0

## 2014-07-21 LAB — BASIC METABOLIC PANEL
Anion gap: 6 (ref 5–15)
BUN: 40 mg/dL — ABNORMAL HIGH (ref 6–23)
CALCIUM: 8.2 mg/dL — AB (ref 8.4–10.5)
CHLORIDE: 107 meq/L (ref 96–112)
CO2: 25 mmol/L (ref 19–32)
Creatinine, Ser: 1.74 mg/dL — ABNORMAL HIGH (ref 0.50–1.10)
GFR calc Af Amer: 30 mL/min — ABNORMAL LOW (ref >90)
GFR calc non Af Amer: 26 mL/min — ABNORMAL LOW (ref >90)
GLUCOSE: 134 mg/dL — AB (ref 70–99)
Potassium: 5.3 mmol/L — ABNORMAL HIGH (ref 3.5–5.1)
Sodium: 138 mmol/L (ref 135–145)

## 2014-07-21 LAB — HEMOGLOBIN AND HEMATOCRIT, BLOOD
HCT: 34.5 % — ABNORMAL LOW (ref 36.0–46.0)
Hemoglobin: 11.3 g/dL — ABNORMAL LOW (ref 12.0–15.0)

## 2014-07-21 MED ORDER — PHENOL 1.4 % MT LIQD
1.0000 | OROMUCOSAL | Status: DC | PRN
  Filled 2014-07-21: qty 177

## 2014-07-21 MED ORDER — MENTHOL 3 MG MT LOZG
1.0000 | LOZENGE | OROMUCOSAL | Status: DC | PRN
  Administered 2014-07-21: 3 mg via ORAL
  Filled 2014-07-21: qty 9

## 2014-07-21 NOTE — Op Note (Signed)
Tracy [name], Tracy [name]              ACCOUNT NO.:  [id number]  MEDICAL RECORD NO.:  [id number]  LOCATION:  7412                         FACILITY:  [facility]  PHYSICIAN:  Alexis Frock, MD     DATE OF BIRTH:  Sep 29, 1927  DATE OF PROCEDURE: 07/20/2014  DATE OF DISCHARGE:                              OPERATIVE REPORT   DIAGNOSIS:  Residual massive volume bladder cancer.  PROCEDURE:  Second-stage transurethral resection of the bladder tumor, volume large.  ESTIMATED BLOOD LOSS:  200 mL.  DRAINS:  A 26-French 3-way Foley catheter to normal saline irrigation, efflux light pink.  FINDINGS: 1. Residual large volume bladder tumor, estimated 8 cm3 volume     resected today. 2. Worrisome for muscle invasion with apparent gross tumor at the     bladder wall musculature along the left wall. 3. Non-identification of the left ureteral orifice.  This was within     the area suspect to be involving the bladder tumor.  INDICATION:  Tracy [name] is a very pleasant 81 year old lady who on workup for gross hematuria, was found to have a massive volume of bladder cancer.  She underwent first-stage transurethral resection, the starting volume estimated at 15 cm3 yesterday on 07/19/2014, at which, approximately half of her tumor was able to be resected.  However, due to challenging visualization with this volume of tumor, it was felt that a second-stage procedure was clearly be warranted and she was felt to be an adequate condition to have this done today as hemoglobin was stable. Informed consent was obtained and placed in medical record.  PROCEDURE IN DETAIL:  The patient being Tracy [name], was verified. Procedure being second-stage transurethral resection of the bladder tumor was confirmed.  Procedure was carried out.  Time-out was performed.  Intravenous antibiotics were administered.  General endotracheal anesthesia was induced.  The patient was placed into a low lithotomy position.  Sterile  field was created by prepping and draping the patient's vagina, introitus, and proximal thighs using iodine x3 after current Foley catheter had been removed.  Next, cystoscopy was performed using a 22-French continuous flow resectoscope sheath with 12- degree offset lens.  Inspection of the urinary bladder revealed residual very large amount of bladder tumor.  Estimated volume today approximately 8 cm3, no evidence of perforation.  Hemostasis was adequate.  Medium-sized resectoscope loop was then used to systematically performed resection of the bladder tumor proceeding from the edges inwards from normal bladder tissue to maintain correct direction and orientation.  This was performed down to what appeared to be the flat fibromuscular layer of the bladder, taking great care to avoid perforation, which did not occur grossly.  The left ureteral orifice was not positively identified and it was felt likely to be involved by the tumor and this was performed for approximately 2 hours. The fragments were set aside for permanent pathology, labeled bladder tumor.  Next, systematic resection was performed again of the entire tumor bed.  Deep biopsy and these fragments were set aside for permanent pathology, labeled as a deeper resection.  Additional electrocoagulation current was applied to the entire bed of the tumor as well as an intervening area, normal bladder tissue around it,  which resulted in quite excellent hemostasis.  The fluoroscopic bed was not functioning. Cystogram was not obtained; however, it was felt to be excellent visualization of the bladder and there was no evidence of perforation and that had been approximately equal inflow and outflow amounts.  As such, the resectoscope was exchanged for new 26-French 3-way Foley catheter, 30 mL sterile water in the balloon.  This connected normal saline irrigation after irrigating quantitatively with the To[place]y syringe.  Procedure was then  terminated.  The patient tolerated the procedure well.  There were no immediate periprocedural complications. The patient was taken to postanesthesia care unit in stable condition.          ______________________________ Alexis Frock, MD     TM/MEDQ  D:  07/20/2014  T:  07/21/2014  Job:  [id number]

## 2014-07-21 NOTE — Progress Notes (Signed)
1 Day Post-Op Subjective: Patient reports no pain. She is eating breakfast   Objective: Vital signs in last 24 hours: Temp:  [96.7 F (35.9 C)-97.7 F (36.5 C)] 97.4 F (36.3 C) (01/01 0558) Pulse Rate:  [61-91] 77 (01/01 0558) Resp:  [13-23] 16 (01/01 0558) BP: (76-118)/(35-63) 86/42 mmHg (01/01 0558) SpO2:  [91 %-100 %] 100 % (01/01 0558)  Intake/Output from previous day: 12/31 0701 - 01/01 0700 In: 5260.8 [I.V.:2985.8; Blood:785; IV XAJOINOMV:672] Out: 0947 [Urine:6150; Blood:400] Intake/Output this shift:    Physical Exam:  Constitutional: Vital signs reviewed. WD WN in NAD   Eyes: PERRL, No scleral icterus.   Pulmonary/Chest: Normal effort  Her irrigant is clear, without clots. Lab Results:  Recent Labs  07/20/14 1527 07/20/14 1531 07/21/14 0406  HGB 7.3* 7.8* 11.3*  HCT 22.4* 23.0* 34.5*   BMET  Recent Labs  07/20/14 1530 07/20/14 1531 07/21/14 0406  NA 135 135 138  K 5.3* 5.3* 5.3*  CL 109  --  107  CO2 22  --  25  GLUCOSE 133* 130* 134*  BUN 39*  --  40*  CREATININE 1.64*  --  1.74*  CALCIUM 6.9*  --  8.2*   No results for input(s): LABPT, INR in the last 72 hours. No results for input(s): LABURIN in the last 72 hours. Results for orders placed or performed during the hospital encounter of 07/19/14  MRSA PCR Screening     Status: None   Collection Time: 07/20/14 11:10 AM  Result Value Ref Range Status   MRSA by PCR NEGATIVE NEGATIVE Final    Comment:        The GeneXpert MRSA Assay (FDA approved for NASAL specimens only), is one component of a comprehensive MRSA colonization surveillance program. It is not intended to diagnose MRSA infection nor to guide or monitor treatment for MRSA infections.     Studies/Results: No results found.  Assessment/Plan:   Postoperative day #1 of second stage TURBT. She is doing quite well. Irrigant is without blood.    I will have her CBI clamped. IV to saline lock. Most likely will be able to  discharge tomorrow.   LOS: 2 days   Franchot Gallo M 07/21/2014, 9:44 AM

## 2014-07-22 MED ORDER — DSS 100 MG PO CAPS
100.0000 mg | ORAL_CAPSULE | Freq: Two times a day (BID) | ORAL | Status: DC
Start: 2014-07-22 — End: 2015-07-06

## 2014-07-22 NOTE — Discharge Summary (Signed)
Patient ID: Tracy [name] MRN: [id number] DOB/AGE: 79/29/1929 79 y.o.  Admit date: 07/19/2014 Discharge date: 07/22/2014  Primary Care Physician:  [name], MD  Discharge Diagnoses:   Present on Admission:  . Bladder cancer  Consults:     Discharge Medications:   Medication List    STOP taking these medications        aspirin 81 MG tablet     warfarin 5 MG tablet  Commonly known as:  COUMADIN      TAKE these medications        acetaminophen 500 MG tablet  Commonly known as:  TYLENOL  Take 500 mg by mouth every 6 (six) hours as needed for pain.     calcium carbonate 600 MG Tabs tablet  Commonly known as:  OS-CAL  Take 600 mg by mouth daily at 12 noon.     diltiazem 120 MG 24 hr capsule  Commonly known as:  CARDIZEM CD  TAKE (1) CAPSULE DAILY     DSS 100 MG Caps  Take 100 mg by mouth 2 (two) times daily.     furosemide 20 MG tablet  Commonly known as:  LASIX  Take 1 tablet (20 mg total) by mouth daily.     meclizine 12.5 MG tablet  Commonly known as:  ANTIVERT  Take 12.5 mg by mouth 3 (three) times daily as needed for dizziness.     metoprolol 50 MG tablet  Commonly known as:  LOPRESSOR  TAKE 1 TAB EVERY MORNING AND 1/2 TAB IN THE EVENING AS NEEDED     multivitamins ther. w/minerals Tabs tablet  Take 1 tablet by mouth daily at 12 noon.     rosuvastatin 20 MG tablet  Commonly known as:  CRESTOR  Take 20 mg by mouth daily after lunch.         Significant Diagnostic Studies:  Dg Chest 2 View  07/18/2014   CLINICAL DATA:  Bladder resection.  EXAM: CHEST  2 VIEW  COMPARISON:  08/08/2008.  FINDINGS: Mediastinum hilar structures normal. Stable changes of pleural scarring noted on the left. No focal pulmonary infiltrate. No pleural effusion or pneumothorax. Heart size normal. No acute bony abnormality. Diffuse osteopenia degenerative change.  IMPRESSION: 1. Stable left pleural scarring. 2. No acute cardiopulmonary disease.   Electronically Signed    By: Marcello Moores  Register   On: 07/18/2014 09:39    Brief H and P: For complete details please refer to admission H and P, but in brief the patient was admitted for the second stage TURBT.  Hospital Course:  Active Problems:   Bladder cancer  The patient underwent TURBT on 07/20/2014. She was left on continuous bladder irrigation for the first 24 hours. Following that, her urine remained clear. Because of the large tumor burden, a catheter was left in. She had no postoperative complications. She was discharged home on postoperative day #2. Day of Discharge BP 100/50 mmHg  Pulse 75  Temp(Src) 98.2 F (36.8 C) (Oral)  Resp 16  Ht 5\' 3"  (1.6 m)  Wt 65.3 kg (143 lb 15.4 oz)  BMI 25.51 kg/m2  SpO2 98%  No results found for this or any previous visit (from the past 24 hour(s)).  Physical Exam: General: Alert and awake oriented x3 not in any acute distress. HEENT: anicteric sclera, pupils reactive to light and accommodation CVS: S1-S2 clear no murmur rubs or gallops Chest: clear to auscultation bilaterally, no wheezing rales or rhonchi Abdomen: soft nontender, nondistended, normal bowel sounds, no organomegaly  Extremities: no cyanosis, clubbing or edema noted bilaterally Neuro: Cranial nerves II-XII intact, no focal neurological deficits  Disposition:  Home  diet: No restrictions  ActiGradually increase   Disposition and Follow-up:     Discharge Instructions    Care order/instruction    Complete by:  As directed   Send patient home with both leg bag and bedside bag. Teaching how to change over.     Discharge patient    Complete by:  As directed             We will arrange follow-up with patient  TESTS THAT NEED FOLLpathology review  DISCHARGE FOLLOW-UP Follow-up Information    Follow up with Alexis Frock, MD.   Specialty:  Urology   Why:  we will contact you for post-op visit with nurse for catheter removal and for MD visit.    Contact information:   [place] [address] [phone]       Time spent on Disc15 minutes  Signed: Jorja Loa 07/22/2014, 7:05 AM

## 2014-07-22 NOTE — Progress Notes (Signed)
Patient discharged home with family. Discharge instruction/teaching reviewed with patient and family.They all verbalized understanding.

## 2014-07-22 NOTE — Discharge Instructions (Signed)
Transurethral Resection of Bladder Tumor (TURBT)  °Definition:  °Transurethral Resection of the Bladder Tumor is a surgical procedure used to diagnose and remove tumors within the bladder. TURBT is the most common treatment for early stage bladder cancer.  ° °General instructions:  °Your recent bladder surgery requires very little post hospital care but some definite precautions.  °Despite the fact that no skin incisions were used, the area around the tumor removal site is raw and covered with scabs to promote healing and prevent bleeding. Certain precautions are needed to insure that the scabs are not disturbed over the next 2-4 weeks while the healing proceeds.  °Because the raw surface inside your bladder and the irritating effects of urine you may expect frequency of urination and/or urgency (a stronger desire to urinate) and perhaps even getting up at night more often. This will usually resolve or improve slowly over the healing period. You may see some blood in your urine over the first 6 weeks. Do not be alarmed, even if the urine was clear for a while. Get off your feet and drink lots of fluids until clearing occurs. If you start to pass clots or don't improve call us.  ° °Diet:  °You may return to your normal diet immediately. Because of the raw surface of your bladder, alcohol, spicy foods, foods high in acid and drinks with caffeine may cause irritation or frequency and should be used in moderation. To keep your urine flowing freely and avoid constipation, drink plenty of fluids during the day (8-10 glasses). Tip: Avoid cranberry juice because it is very acidic. °  °Activity:  °Your physical activity needs to be restricted somewhat.  We suggest that you reduce your activity under the circumstances until the bleeding has stopped. Heavy lifting (greater than 20 lbs.) and heavy exertion should be limited for 2-3 weeks. ° °Bowels:  °It is important to keep your bowels regular during the postoperative period.  Straining with bowel movements can cause bleeding. A bowel movement every other day is reasonable. Use a mild laxative if needed, such as milk of magnesia 2-3 tablespoons, or 2 Dulcolax tablets. Call if you continue to have problems. If you had been taking narcotics for pain, before, during or after your surgery, you may be constipated.  ° °Medication:  °You should resume your pre-surgery medications unless told not to. In addition you may be given an antibiotic to prevent or treat infection. Antibiotics are not always necessary. All medication should be taken as prescribed until the bottles are finished unless you are having an unusual reaction to one of the drugs.  °General Anesthetic, Adult  °A doctor specialized in giving anesthesia (anesthesiologist) or a nurse specialized in giving anesthesia (nurse anesthetist) gives medicine that makes you sleep while a procedure is performed (general anesthetic). Once the general anesthetic has been administered, you will be in a sleeplike state in which you feel no pain. After having a general anesthetic you may feel:  °Dizzy.  °Weak.  °Drowsy.  °Confused.  °These feelings are normal and can be expected to last for up to 24 hours after the procedure. ° ° °LET YOUR CAREGIVER KNOW ABOUT:  °Allergies you have.  °Medications you are taking, including herbs, eye drops, over the counter medications, dietary supplements, and creams.  °Previous problems you have had with anesthetics or numbing medicines.  °Use of cigarettes, alcohol, or illicit drugs.  °Possibility of pregnancy, if this applies.  °History of bleeding or blood disorders, including blood clots and clotting   disorders.  °Previous surgeries you have had and types of anesthetics you have received.  °Family medical history, especially anesthetic problems.  °Other health problems.  ° °AFTER THE PROCEDURE  °After surgery, you will be taken to the recovery area where a nurse will monitor your progress. You will be allowed  to go home when you are awake, stable, taking fluids well, and without serious pain or complications.  °For the first 24 hours following an anesthetic:  °Have a responsible person with you.  °Do not drive a car. If you are alone, do not take public transportation.  °Do not engage in strenuous activity. You may usually resume normal activities the next day, or as advised by your caregiver.  °Do not drink alcohol.  °Do not take medicine that has not been prescribed by your caregiver.  °Do not sign important papers or make important decisions as your judgement may be impaired.  °You may resume a normal diet as directed.  °Change bandages (dressings) as directed.  °Only take over-the-counter or prescription medicines for pain, discomfort, or fever as directed by your caregiver.  °If you have questions or problems that seem related to the anesthetic, call the hospital and ask for the anesthetist, anesthesiologist, or anesthesia department.  ° °SEEK IMMEDIATE MEDICAL CARE IF:  °You develop a rash.  °You have difficulty breathing.  °You have chest pain.  °You have allergic problems.  °You have uncontrolled nausea.  °You have uncontrolled vomiting.  °You develop any serious bleeding, especially from the incision site.  °Document Released: 10/14/2007 Document Revised: 03/19/2011 Document Reviewed: 11/07/2010  °ExitCare® Patient Information ©2012 [employer].  ° ° °

## 2014-07-22 NOTE — Progress Notes (Signed)
CARE MANAGEMENT NOTE 07/22/2014  Patient:  Tracy [name], Tracy [name]   Account Number:  [id number]  Date Initiated:  07/22/2014  Documentation initiated by:  [facility]  Subjective/Objective Assessment:   Second-stage transurethral resection of the bladder tumor,  volume large.     Action/Plan:   Anticipated DC Date:  07/22/2014   Anticipated DC Plan:  [place]  CM consult      Choice offered to / List presented to:             Status of service:  Completed, signed off Medicare Important Message given?  YES (If response is "NO", the following Medicare IM given date fields will be blank) Date Medicare IM given:  07/22/2014 Medicare IM given by:  [facility] Date Additional Medicare IM given:   Additional Medicare IM given by:    Discharge Disposition:  HOME/SELF CARE  Per UR Regulation:    If discussed at Long Length of Stay Meetings, dates discussed:    Comments:  07/22/2014 1445 NCM received a call. Dtr requesting a RW with seat for pt. Spoke to oncall MD, Dr. Louis Meckel. Order placed. Dtr requested order  faxed to [facility] # [phone]. Pharmacy closed. Will contact on 1/4 for fax number and fax order to pharmacy. Jonnie Finner RN CCM Case Mgmt phone [phone]   07/22/2014 1000 NCM spoke to pt and No NCM needs identified. Jonnie Finner RN CCM Case Mgmt phone [phone]

## 2014-07-24 ENCOUNTER — Encounter (HOSPITAL_COMMUNITY): Payer: Self-pay | Admitting: Urology

## 2014-07-24 NOTE — Progress Notes (Signed)
07/24/2014 1420 Left message for dtr, [name] about DME faxed to [facility]. Left her [facility] contact number on vm.  Spoke to [facility] and they do not bill for DME. Faxed orders to [facility] for delivery or family to pick up from home store. Jonnie Finner RN CCM Case Mgmt phone [phone]

## 2014-08-14 ENCOUNTER — Telehealth: Payer: Self-pay | Admitting: Cardiovascular Disease

## 2014-08-14 NOTE — Telephone Encounter (Signed)
Spoke with pt's daughter and confirmed pt is to be on Lasix 20 mg daily. Daughter reports pt notes swelling in both feet and legs since discharge from hospital. Worsened in last week. No shortness of breath. Does not weigh daily but will start and keep record of weights.  Was recently discharged from hospital on July 22, 2014 after surgery for bladder cancer. Daughter can be reached on cell phone--[phone]

## 2014-08-14 NOTE — Telephone Encounter (Signed)
New Msg        Pt daughter calling, would like a call back.    Daughter has questions about Lasix dosage.

## 2014-08-15 ENCOUNTER — Encounter (HOSPITAL_COMMUNITY): Payer: Self-pay

## 2014-08-15 ENCOUNTER — Emergency Department (HOSPITAL_COMMUNITY): Payer: Medicare Other

## 2014-08-15 ENCOUNTER — Inpatient Hospital Stay (HOSPITAL_COMMUNITY)
Admission: EM | Admit: 2014-08-15 | Discharge: 2014-08-19 | DRG: 293 | Disposition: A | Discharge status: Home Health | Payer: Medicare Other | Attending: Family Medicine | Admitting: Family Medicine

## 2014-08-15 DIAGNOSIS — D638 Anemia in other chronic diseases classified elsewhere: Secondary | ICD-10-CM | POA: Diagnosis present

## 2014-08-15 DIAGNOSIS — R079 Chest pain, unspecified: Secondary | ICD-10-CM

## 2014-08-15 DIAGNOSIS — Z9111 Patient's noncompliance with dietary regimen: Secondary | ICD-10-CM | POA: Diagnosis present

## 2014-08-15 DIAGNOSIS — I482 Chronic atrial fibrillation: Secondary | ICD-10-CM

## 2014-08-15 DIAGNOSIS — I872 Venous insufficiency (chronic) (peripheral): Secondary | ICD-10-CM | POA: Diagnosis present

## 2014-08-15 DIAGNOSIS — I10 Essential (primary) hypertension: Secondary | ICD-10-CM | POA: Diagnosis present

## 2014-08-15 DIAGNOSIS — D649 Anemia, unspecified: Secondary | ICD-10-CM

## 2014-08-15 DIAGNOSIS — I509 Heart failure, unspecified: Secondary | ICD-10-CM

## 2014-08-15 DIAGNOSIS — E785 Hyperlipidemia, unspecified: Secondary | ICD-10-CM | POA: Diagnosis present

## 2014-08-15 DIAGNOSIS — Z7982 Long term (current) use of aspirin: Secondary | ICD-10-CM

## 2014-08-15 DIAGNOSIS — R0602 Shortness of breath: Secondary | ICD-10-CM | POA: Diagnosis present

## 2014-08-15 DIAGNOSIS — E8809 Other disorders of plasma-protein metabolism, not elsewhere classified: Secondary | ICD-10-CM | POA: Diagnosis present

## 2014-08-15 DIAGNOSIS — I5033 Acute on chronic diastolic (congestive) heart failure: Principal | ICD-10-CM

## 2014-08-15 DIAGNOSIS — M1990 Unspecified osteoarthritis, unspecified site: Secondary | ICD-10-CM | POA: Diagnosis present

## 2014-08-15 DIAGNOSIS — E782 Mixed hyperlipidemia: Secondary | ICD-10-CM | POA: Diagnosis present

## 2014-08-15 DIAGNOSIS — C679 Malignant neoplasm of bladder, unspecified: Secondary | ICD-10-CM | POA: Diagnosis present

## 2014-08-15 DIAGNOSIS — I2510 Atherosclerotic heart disease of native coronary artery without angina pectoris: Secondary | ICD-10-CM | POA: Diagnosis present

## 2014-08-15 DIAGNOSIS — R0600 Dyspnea, unspecified: Secondary | ICD-10-CM

## 2014-08-15 DIAGNOSIS — I4891 Unspecified atrial fibrillation: Secondary | ICD-10-CM | POA: Diagnosis present

## 2014-08-15 DIAGNOSIS — I080 Rheumatic disorders of both mitral and aortic valves: Secondary | ICD-10-CM | POA: Diagnosis present

## 2014-08-15 DIAGNOSIS — K219 Gastro-esophageal reflux disease without esophagitis: Secondary | ICD-10-CM | POA: Diagnosis present

## 2014-08-15 DIAGNOSIS — I5031 Acute diastolic (congestive) heart failure: Secondary | ICD-10-CM | POA: Diagnosis present

## 2014-08-15 LAB — CBC WITH DIFFERENTIAL/PLATELET
Basophils Absolute: 0.1 10*3/uL (ref 0.0–0.1)
Basophils Relative: 1 % (ref 0–1)
EOS ABS: 0.1 10*3/uL (ref 0.0–0.7)
Eosinophils Relative: 2 % (ref 0–5)
HCT: 28.1 % — ABNORMAL LOW (ref 36.0–46.0)
Hemoglobin: 9.1 g/dL — ABNORMAL LOW (ref 12.0–15.0)
LYMPHS ABS: 1 10*3/uL (ref 0.7–4.0)
Lymphocytes Relative: 23 % (ref 12–46)
MCH: 32.2 pg (ref 26.0–34.0)
MCHC: 32.4 g/dL (ref 30.0–36.0)
MCV: 99.3 fL (ref 78.0–100.0)
Monocytes Absolute: 0.6 10*3/uL (ref 0.1–1.0)
Monocytes Relative: 13 % — ABNORMAL HIGH (ref 3–12)
Neutro Abs: 2.8 10*3/uL (ref 1.7–7.7)
Neutrophils Relative %: 61 % (ref 43–77)
Platelets: 194 10*3/uL (ref 150–400)
RBC: 2.83 MIL/uL — ABNORMAL LOW (ref 3.87–5.11)
RDW: 15 % (ref 11.5–15.5)
WBC: 4.5 10*3/uL (ref 4.0–10.5)

## 2014-08-15 LAB — BRAIN NATRIURETIC PEPTIDE: B Natriuretic Peptide: 719 pg/mL — ABNORMAL HIGH (ref 0.0–100.0)

## 2014-08-15 LAB — POC OCCULT BLOOD, ED: Fecal Occult Bld: NEGATIVE

## 2014-08-15 LAB — COMPREHENSIVE METABOLIC PANEL
ALBUMIN: 2.9 g/dL — AB (ref 3.5–5.2)
ALT: 13 U/L (ref 0–35)
ANION GAP: 5 (ref 5–15)
AST: 18 U/L (ref 0–37)
Alkaline Phosphatase: 74 U/L (ref 39–117)
BUN: 29 mg/dL — ABNORMAL HIGH (ref 6–23)
CALCIUM: 8.7 mg/dL (ref 8.4–10.5)
CHLORIDE: 107 mmol/L (ref 96–112)
CO2: 28 mmol/L (ref 19–32)
Creatinine, Ser: 1.23 mg/dL — ABNORMAL HIGH (ref 0.50–1.10)
GFR, EST AFRICAN AMERICAN: 45 mL/min — AB (ref >90)
GFR, EST NON AFRICAN AMERICAN: 39 mL/min — AB (ref >90)
GLUCOSE: 115 mg/dL — AB (ref 70–99)
POTASSIUM: 3.7 mmol/L (ref 3.5–5.1)
Sodium: 140 mmol/L (ref 135–145)
Total Bilirubin: 0.6 mg/dL (ref 0.3–1.2)
Total Protein: 5.6 g/dL — ABNORMAL LOW (ref 6.0–8.3)

## 2014-08-15 LAB — MRSA PCR SCREENING: MRSA by PCR: NEGATIVE

## 2014-08-15 LAB — PROTIME-INR
INR: 1.1 (ref 0.00–1.49)
PROTHROMBIN TIME: 14.3 s (ref 11.6–15.2)

## 2014-08-15 LAB — TROPONIN I

## 2014-08-15 LAB — MAGNESIUM: Magnesium: 2.3 mg/dL (ref 1.5–2.5)

## 2014-08-15 MED ORDER — FUROSEMIDE 10 MG/ML IJ SOLN
60.0000 mg | Freq: Once | INTRAMUSCULAR | Status: AC
  Administered 2014-08-15: 60 mg via INTRAVENOUS
  Filled 2014-08-15: qty 6

## 2014-08-15 MED ORDER — ACETAMINOPHEN 325 MG PO TABS: 650.0000 mg | ORAL_TABLET | Freq: Four times a day (QID) | ORAL | Status: DC | PRN

## 2014-08-15 MED ORDER — CALCIUM CARBONATE 600 MG PO TABS: 600.0000 mg | ORAL_TABLET | Freq: Every day | ORAL | Status: DC

## 2014-08-15 MED ORDER — ONDANSETRON HCL 4 MG/2ML IJ SOLN: 4.0000 mg | Freq: Four times a day (QID) | INTRAMUSCULAR | Status: DC | PRN

## 2014-08-15 MED ORDER — METOPROLOL TARTRATE 50 MG PO TABS
50.0000 mg | ORAL_TABLET | Freq: Two times a day (BID) | ORAL | Status: DC
  Administered 2014-08-15 – 2014-08-19 (×5): 50 mg via ORAL
  Filled 2014-08-15 (×6): qty 1

## 2014-08-15 MED ORDER — DOCUSATE SODIUM 100 MG PO CAPS
100.0000 mg | ORAL_CAPSULE | Freq: Two times a day (BID) | ORAL | Status: DC
  Administered 2014-08-15 – 2014-08-19 (×9): 100 mg via ORAL
  Filled 2014-08-15 (×9): qty 1

## 2014-08-15 MED ORDER — MORPHINE SULFATE 2 MG/ML IJ SOLN
2.0000 mg | INTRAMUSCULAR | Status: DC | PRN
  Administered 2014-08-15 – 2014-08-16 (×2): 2 mg via INTRAVENOUS
  Filled 2014-08-15 (×2): qty 1

## 2014-08-15 MED ORDER — ACETAMINOPHEN 650 MG RE SUPP: 650.0000 mg | Freq: Four times a day (QID) | RECTAL | Status: DC | PRN

## 2014-08-15 MED ORDER — ASPIRIN 81 MG PO TABS: 81.0000 mg | ORAL_TABLET | Freq: Every day | ORAL | Status: DC

## 2014-08-15 MED ORDER — FUROSEMIDE 10 MG/ML IJ SOLN
40.0000 mg | Freq: Every day | INTRAMUSCULAR | Status: DC
  Administered 2014-08-16 – 2014-08-18 (×3): 40 mg via INTRAVENOUS
  Filled 2014-08-15 (×3): qty 4

## 2014-08-15 MED ORDER — WARFARIN SODIUM 5 MG PO TABS
5.0000 mg | ORAL_TABLET | Freq: Once | ORAL | Status: AC
  Administered 2014-08-15: 5 mg via ORAL
  Filled 2014-08-15: qty 1

## 2014-08-15 MED ORDER — WARFARIN - PHARMACIST DOSING INPATIENT
Status: DC
  Administered 2014-08-15: 16:00:00

## 2014-08-15 MED ORDER — NITROGLYCERIN 2 % TD OINT
1.0000 [in_us] | TOPICAL_OINTMENT | Freq: Once | TRANSDERMAL | Status: AC
  Administered 2014-08-15: 1 [in_us] via TOPICAL
  Filled 2014-08-15: qty 1

## 2014-08-15 MED ORDER — ADULT MULTIVITAMIN W/MINERALS CH
1.0000 | ORAL_TABLET | Freq: Every day | ORAL | Status: DC
  Administered 2014-08-15 – 2014-08-19 (×5): 1 via ORAL
  Filled 2014-08-15 (×5): qty 1

## 2014-08-15 MED ORDER — DILTIAZEM HCL ER COATED BEADS 120 MG PO CP24
120.0000 mg | ORAL_CAPSULE | Freq: Every day | ORAL | Status: DC
  Administered 2014-08-15 – 2014-08-19 (×5): 120 mg via ORAL
  Filled 2014-08-15 (×5): qty 1

## 2014-08-15 MED ORDER — CALCIUM CARBONATE 1250 (500 CA) MG PO TABS
1.0000 | ORAL_TABLET | Freq: Every day | ORAL | Status: DC
  Administered 2014-08-15 – 2014-08-19 (×5): 500 mg via ORAL
  Filled 2014-08-15 (×5): qty 1

## 2014-08-15 MED ORDER — ASPIRIN 325 MG PO TABS
325.0000 mg | ORAL_TABLET | Freq: Every day | ORAL | Status: DC
  Administered 2014-08-15 – 2014-08-19 (×5): 325 mg via ORAL
  Filled 2014-08-15 (×5): qty 1

## 2014-08-15 MED ORDER — ONDANSETRON HCL 4 MG PO TABS: 4.0000 mg | ORAL_TABLET | Freq: Four times a day (QID) | ORAL | Status: DC | PRN

## 2014-08-15 MED ORDER — ROSUVASTATIN CALCIUM 20 MG PO TABS
20.0000 mg | ORAL_TABLET | Freq: Every day | ORAL | Status: DC
  Administered 2014-08-15 – 2014-08-19 (×5): 20 mg via ORAL
  Filled 2014-08-15 (×5): qty 1

## 2014-08-15 MED ORDER — SODIUM CHLORIDE 0.9 % IJ SOLN
3.0000 mL | Freq: Two times a day (BID) | INTRAMUSCULAR | Status: DC
  Administered 2014-08-15 – 2014-08-18 (×8): 3 mL via INTRAVENOUS

## 2014-08-15 NOTE — Progress Notes (Addendum)
ANTICOAGULATION CONSULT NOTE - Initial Consult  Pharmacy Consult for Coumadin Indication: atrial fibrillation  Allergies  Allergen Reactions  . Amoxicillin Other (See Comments)    Unknown  . Penicillins Other (See Comments)    Unknown  . Sulfonamide Derivatives Other (See Comments)    Unknown   Patient Measurements: Height: 5\' 3"  (160 cm) Weight: 130 lb (58.968 kg) IBW/kg (Calculated) : 52.4  Vital Signs: Temp: 97.5 F (36.4 C) (01/26 0805) Temp Source: Oral (01/26 0805) BP: 135/49 mmHg (01/26 1100) Pulse Rate: 76 (01/26 1100)  Labs:  Recent Labs  08/15/14 0341  HGB 9.1*  HCT 28.1*  PLT 194  LABPROT 14.3  INR 1.10  CREATININE 1.23*  TROPONINI <0.03   Estimated Creatinine Clearance: 28.2 mL/min (by C-G formula based on Cr of 1.23).  Medical History: Past Medical History  Diagnosis Date  . Hyperlipidemia   . Coronary artery disease   . Venous insufficiency   . CHF (congestive heart failure)     acute diastolic heart failure  . Arrhythmia     chronic afib  . GERD (gastroesophageal reflux disease)   . Mitral valve prolapse   . Shoulder pain, right   . Swelling     both legs and feet  . Shortness of breath dyspnea   . Headache     occasional headache  . Arthritis   . Sleeping difficulties    Medications:  Prescriptions prior to admission  Medication Sig Dispense Refill Last Dose  . acetaminophen (TYLENOL) 500 MG tablet Take 500 mg by mouth every 6 (six) hours as needed for pain.   unknown  . aspirin 81 MG tablet Take 81 mg by mouth daily.   08/14/2014 at Unknown time  . calcium carbonate (OS-CAL) 600 MG TABS Take 600 mg by mouth daily at 12 noon.    08/14/2014 at Unknown time  . diltiazem (CARDIZEM CD) 120 MG 24 hr capsule TAKE (1) CAPSULE DAILY (Patient taking differently: TAKE (1) CAPSULE DAILY IN THE MORNING.) 30 capsule 6 08/14/2014 at Unknown time  . docusate sodium 100 MG CAPS Take 100 mg by mouth 2 (two) times daily. 30 capsule 0 08/14/2014 at  Unknown time  . furosemide (LASIX) 20 MG tablet Take 1 tablet (20 mg total) by mouth daily. (Patient taking differently: Take 20 mg by mouth daily at 12 noon. ) 30 tablet 11 08/14/2014 at Unknown time  . meclizine (ANTIVERT) 12.5 MG tablet Take 12.5 mg by mouth 3 (three) times daily as needed for dizziness.    unknown  . metoprolol (LOPRESSOR) 50 MG tablet Take 50 mg by mouth every morning.  4 08/14/2014 at 0800  . Multiple Vitamins-Minerals (MULTIVITAMINS THER. W/MINERALS) TABS Take 1 tablet by mouth daily at 12 noon.    08/14/2014 at Unknown time  . rosuvastatin (CRESTOR) 20 MG tablet Take 20 mg by mouth daily after lunch.    08/14/2014 at Unknown time   Assessment: Very pleasant 81yo female with h/o chronic Afib.  Pt has been on Coumadin in the past but reports she stopped taking it prior to surgery to have bladder tumor removed.  Pt did have hematuria after dx of bladder cancer.  Pt does not remember the strength of her Coumadin tablets but states she took 1/2 tablet twice a week and a whole tablet other days.  No Coumadin listed on PTA med list.  INR is at baseline as expected.  Asked to resume Warfarin for afib.    Goal of Therapy:  INR 2-3  Monitor platelets by anticoagulation protocol: Yes   Plan:  Coumadin 5mg  po today x 1 INR daily Coumadin education provided but she is familiar with precautions  Hart Robinsons A 08/15/2014,11:12 AM

## 2014-08-15 NOTE — H&P (Signed)
[facility] History and Physical  Tracy [name] JME:[id number] DOB: Sep 13, 1927 DOA: 08/15/2014  Referring physician: Emergency Department PCP: [name], MD  Specialists:   Chief Complaint: SOB  HPI: Tracy [name] is a 81 y.o. female  With a hc of diastolic chf, htn, bladder cancer s/p recent surgery last week who presents to the ED with progressive sob and LE edema. Pt lives alone but family lives next door to pt. Pt admitted to drinking ample amounts of water daily because "I like it." In the ED, pt was found to have an elevated BNP of 700 and O2 sats in the low 90's. CXR was notable for small bilateral pleural effusions with cardiac enlargement. Pt was given one dose of lasix with improvement. Hospitalist consulted for admission.  Review of Systems:  Per above, the remainder of the 10pt ros reviewed and are neg  Past Medical History  Diagnosis Date  . Hyperlipidemia   . Coronary artery disease   . Venous insufficiency   . CHF (congestive heart failure)     acute diastolic heart failure  . Arrhythmia     chronic afib  . GERD (gastroesophageal reflux disease)   . Mitral valve prolapse   . Shoulder pain, right   . Swelling     both legs and feet  . Shortness of breath dyspnea   . Headache     occasional headache  . Arthritis   . Sleeping difficulties    Past Surgical History  Procedure Laterality Date  . Colonoscopy N/A 09/30/2012    Procedure: COLONOSCOPY;  Surgeon: Rogene Houston, MD;  [facility];  Service: Endoscopy;  Laterality: N/A;  930  . Cardioversion      x2  . Back surgery      x2-lower back  . Transurethral resection of bladder tumor with gyrus (turbt-gyrus) N/A 07/19/2014    Procedure: TRANSURETHRAL RESECTION OF BLADDER TUMOR WITH GYRUS (TURBT-GYRUS) WITH CYSTOGRAM;  Surgeon: Alexis Frock, MD;  Location: [facility];  Service: Urology;  Laterality: N/A;  . Transurethral resection of bladder tumor with gyrus (turbt-gyrus) N/A  07/20/2014    Procedure: SECOND LOOK TRANSURETHRAL RESECTION OF BLADDER TUMOR WITH GYRUS (TURBT-GYRUS);  Surgeon: Alexis Frock, MD;  Location: [facility];  Service: Urology;  Laterality: N/A;   Social History:  reports that she has never smoked. She has never used smokeless tobacco. She reports that she does not drink alcohol or use illicit drugs.  where does patient live--home, ALF, SNF? and with whom if at home?  Can patient participate in ADLs?  Allergies  Allergen Reactions  . Amoxicillin Other (See Comments)    Unknown  . Penicillins Other (See Comments)    Unknown  . Sulfonamide Derivatives Other (See Comments)    Unknown    Family History  Problem Relation Age of Onset  . Colon cancer Neg Hx   . Colon polyps Sister     (be sure to complete)  Prior to Admission medications   Medication Sig Start Date End Date Taking? Authorizing Provider  acetaminophen (TYLENOL) 500 MG tablet Take 500 mg by mouth every 6 (six) hours as needed for pain.   Yes Historical Provider, MD  aspirin 81 MG tablet Take 81 mg by mouth daily.   Yes Historical Provider, MD  calcium carbonate (OS-CAL) 600 MG TABS Take 600 mg by mouth daily at 12 noon.    Yes Historical Provider, MD  diltiazem (CARDIZEM CD) 120 MG 24 hr capsule TAKE (1) CAPSULE DAILY  Patient taking differently: TAKE (1) CAPSULE DAILY IN THE MORNING. 05/15/14  Yes Burnell Blanks, MD  docusate sodium 100 MG CAPS Take 100 mg by mouth 2 (two) times daily. 07/22/14  Yes Jorja Loa, MD  furosemide (LASIX) 20 MG tablet Take 1 tablet (20 mg total) by mouth daily. Patient taking differently: Take 20 mg by mouth daily at 12 noon.  05/26/14  Yes Burnell Blanks, MD  meclizine (ANTIVERT) 12.5 MG tablet Take 12.5 mg by mouth 3 (three) times daily as needed for dizziness.    Yes Historical Provider, MD  Multiple Vitamins-Minerals (MULTIVITAMINS THER. W/MINERALS) TABS Take 1 tablet by mouth daily at 12 noon.    Yes Historical Provider, MD   metoprolol (LOPRESSOR) 50 MG tablet TAKE 1 TAB EVERY MORNING AND 1/2 TAB IN THE EVENING AS NEEDED Patient not taking: Reported on 07/18/2014 06/23/14   Burnell Blanks, MD  rosuvastatin (CRESTOR) 20 MG tablet Take 20 mg by mouth daily after lunch.     Historical Provider, MD   Physical Exam: Filed Vitals:   08/15/14 0500 08/15/14 0530 08/15/14 0700 08/15/14 0730  BP: 136/76 150/85 139/59 152/81  Pulse: 78 99 61 61  Temp:      TempSrc:      Resp: 24 17 21 21   Height:      Weight:      SpO2: 93% 100% 98% 99%     General:  Awake, in nad  Eyes: PERRL b  ENT: membranes moist, dentition fair  Neck: trachea midline, neck supple   Cardiovascular: regular, s1, s2  Respiratory: normal resp effort, no wheezing  Abdomen: soft,nondistended  Skin: normal skin turogor  Musculoskeletal: perfused, no clubbing, 2-3+ pitting LE edema b  Psychiatric: mood/affect normal// no auditory/visual hallucinations  Neurologic: cn2-12 grossly intact, strength/sensation intact  Labs on Admission:  Basic Metabolic Panel:  Recent Labs Lab 08/15/14 0341  NA 140  K 3.7  CL 107  CO2 28  GLUCOSE 115*  BUN 29*  CREATININE 1.23*  CALCIUM 8.7  MG 2.3   Liver Function Tests:  Recent Labs Lab 08/15/14 0341  AST 18  ALT 13  ALKPHOS 74  BILITOT 0.6  PROT 5.6*  ALBUMIN 2.9*   No results for input(s): LIPASE, AMYLASE in the last 168 hours. No results for input(s): AMMONIA in the last 168 hours. CBC:  Recent Labs Lab 08/15/14 0341  WBC 4.5  NEUTROABS 2.8  HGB 9.1*  HCT 28.1*  MCV 99.3  PLT 194   Cardiac Enzymes:  Recent Labs Lab 08/15/14 0341  TROPONINI <0.03    BNP (last 3 results) No results for input(s): PROBNP in the last 8760 hours. CBG: No results for input(s): GLUCAP in the last 168 hours.  Radiological Exams on Admission: Dg Chest Port 1 View  08/15/2014   CLINICAL DATA:  Shortness of breath and weakness.  EXAM: PORTABLE CHEST - 1 VIEW  COMPARISON:   07/18/2014  FINDINGS: Cardiac enlargement without vascular congestion. Small bilateral pleural effusions with basilar atelectasis. No pneumothorax. No focal consolidation. Calcification of the aorta.  IMPRESSION: Cardiac enlargement. Small bilateral pleural effusions with basilar atelectasis.   Electronically Signed   By: Lucienne Capers M.D.   On: 08/15/2014 04:09    Assessment/Plan Active Problems:   Essential hypertension   Acute diastolic heart failure   HYPERLIPIDEMIA TYPE IIB / III   MITRAL REGURG W/ AORTIC INSUFF, RHEUM/NON-RHEUM   Bladder cancer   Chest pain   1. Acutely decompensated diastolic CHF  1. Improved with one dose of lasix 2. O2 requirements improved 3. Will check 2d echo 4. Follow daily wt and i/o 5. Cont IV lasix daily 2. HTN 1. BP stable 2. Cont home meds 3. Afib 1. Rate controlled 2. Cont cardizem and beta blocker 3. Will resume therapeutic anticoagulation. Per family, pt was to resume coumadin post-operatively, but has not started yet 4. Will restart coumadin 4. Bladder cancer 1. S/p surgery. Stable 5. DVT prophylaxis 1. Therapeutic anticoagulation  Code Status: Full (must indicate code status--if unknown or must be presumed, indicate so) Family Communication: Pt in room, family at bedside (indicate person spoken with, if applicable, with phone number if by telephone) Disposition Plan: Admit to stepdown (indicate anticipated LOS)  Time spent: 45min  [facility], [facility] Pager [phone]  If 7PM-7AM, please contact night-coverage [email or web address] Password [facility] 08/15/2014, 7:43 AM

## 2014-08-15 NOTE — ED Provider Notes (Signed)
CSN: [id number]     Arrival date & time 08/15/14  0235 History   First MD Initiated Contact with Patient 08/15/14 0253     Chief Complaint  Patient presents with  . Shortness of Breath     (Consider location/radiation/quality/duration/timing/severity/associated sxs/prior Treatment) HPI  Patient reports for the past few weeks she's been having shortness of breath off and on. She states that shortness of breath lasted a few seconds. She states she is also aware of her heart skipping frequently. She denies any chest pain. She states tonight she started feeling lightheaded and dizzy which is why she came to the ED. She however has chronic dizziness and takes dizzy pills. She has had a mild dry cough without fever. She denies nausea, vomiting, or diaphoresis. She reports swelling of her lower extremities or the past 2-3 weeks. She reports some mild dyspnea on exertion and states she is unable to sleep so she has to sleep in a recliner. Patient states she has a history of congestive heart failure and she was taken from 40 mg a day down to 20 mg a day. Patient states she has a history of mitral valve prolapse and atrial fibrillation.   PCP Dr Everette Rank Cardiology Dr Julianne Handler  Past Medical History  Diagnosis Date  . Hyperlipidemia   . Coronary artery disease   . Venous insufficiency   . CHF (congestive heart failure)     acute diastolic heart failure  . Arrhythmia     chronic afib  . GERD (gastroesophageal reflux disease)   . Mitral valve prolapse   . Shoulder pain, right   . Swelling     both legs and feet  . Shortness of breath dyspnea   . Headache     occasional headache  . Arthritis   . Sleeping difficulties    Past Surgical History  Procedure Laterality Date  . Colonoscopy N/A 09/30/2012    Procedure: COLONOSCOPY;  Surgeon: Rogene Houston, MD;  [facility];  Service: Endoscopy;  Laterality: N/A;  930  . Cardioversion      x2  . Back surgery      x2-lower back  .  Transurethral resection of bladder tumor with gyrus (turbt-gyrus) N/A 07/19/2014    Procedure: TRANSURETHRAL RESECTION OF BLADDER TUMOR WITH GYRUS (TURBT-GYRUS) WITH CYSTOGRAM;  Surgeon: Alexis Frock, MD;  Location: [facility];  Service: Urology;  Laterality: N/A;  . Transurethral resection of bladder tumor with gyrus (turbt-gyrus) N/A 07/20/2014    Procedure: SECOND LOOK TRANSURETHRAL RESECTION OF BLADDER TUMOR WITH GYRUS (TURBT-GYRUS);  Surgeon: Alexis Frock, MD;  Location: [facility];  Service: Urology;  Laterality: N/A;   Family History  Problem Relation Age of Onset  . Colon cancer Neg Hx   . Colon polyps Sister    History  Substance Use Topics  . Smoking status: Never Smoker   . Smokeless tobacco: Never Used  . Alcohol Use: No   Patient lives at home Patient lives alone Patient uses a walker  OB History    No data available     Review of Systems  All other systems reviewed and are negative.     Allergies  Amoxicillin; Penicillins; and Sulfonamide derivatives  Home Medications   Prior to Admission medications   Medication Sig Start Date End Date Taking? Authorizing Provider  acetaminophen (TYLENOL) 500 MG tablet Take 500 mg by mouth every 6 (six) hours as needed for pain.   Yes Historical Provider, MD  aspirin 81 MG tablet Take  81 mg by mouth daily.   Yes Historical Provider, MD  calcium carbonate (OS-CAL) 600 MG TABS Take 600 mg by mouth daily at 12 noon.    Yes Historical Provider, MD  diltiazem (CARDIZEM CD) 120 MG 24 hr capsule TAKE (1) CAPSULE DAILY Patient taking differently: TAKE (1) CAPSULE DAILY IN THE MORNING. 05/15/14  Yes Burnell Blanks, MD  docusate sodium 100 MG CAPS Take 100 mg by mouth 2 (two) times daily. 07/22/14  Yes Jorja Loa, MD  furosemide (LASIX) 20 MG tablet Take 1 tablet (20 mg total) by mouth daily. Patient taking differently: Take 20 mg by mouth daily at 12 noon.  05/26/14  Yes Burnell Blanks, MD  meclizine (ANTIVERT)  12.5 MG tablet Take 12.5 mg by mouth 3 (three) times daily as needed for dizziness.    Yes Historical Provider, MD  Multiple Vitamins-Minerals (MULTIVITAMINS THER. W/MINERALS) TABS Take 1 tablet by mouth daily at 12 noon.    Yes Historical Provider, MD  metoprolol (LOPRESSOR) 50 MG tablet TAKE 1 TAB EVERY MORNING AND 1/2 TAB IN THE EVENING AS NEEDED Patient not taking: Reported on 07/18/2014 06/23/14   Burnell Blanks, MD  rosuvastatin (CRESTOR) 20 MG tablet Take 20 mg by mouth daily after lunch.     Historical Provider, MD   BP 170/81 mmHg  Pulse 43  Temp(Src) 97.9 F (36.6 C) (Oral)  Resp 17  Ht 5\' 3"  (1.6 m)  Wt 130 lb (58.968 kg)  BMI 23.03 kg/m2  SpO2 93%  Vital signs normal (the bradycardia appears to be artifactual, her heart rate on her EKG was in the 80s and during my interview was in the 70s and 80s with frequent PVCs)  Physical Exam  Constitutional: She is oriented to person, place, and time. She appears well-developed and well-nourished.  Non-toxic appearance. She does not appear ill. No distress.  HENT:  Head: Normocephalic and atraumatic.  Right Ear: External ear normal.  Left Ear: External ear normal.  Nose: Nose normal. No mucosal edema or rhinorrhea.  Mouth/Throat: Oropharynx is clear and moist and mucous membranes are normal. No dental abscesses or uvula swelling.  Eyes: Conjunctivae and EOM are normal. Pupils are equal, round, and reactive to light.  Neck: Normal range of motion and full passive range of motion without pain. Neck supple.  Cardiovascular: Normal rate and normal heart sounds.  An irregularly irregular rhythm present. Frequent extrasystoles are present. Exam reveals no gallop and no friction rub.   No murmur heard. Pulmonary/Chest: Effort normal. No respiratory distress. She has no wheezes. She has no rhonchi. She has rales. She exhibits no tenderness and no crepitus.  Patient has crackles or rales at her bases and a very limited area  Abdominal:  Soft. Normal appearance and bowel sounds are normal. She exhibits no distension. There is no tenderness. There is no rebound and no guarding.  Musculoskeletal: Normal range of motion. She exhibits edema. She exhibits no tenderness.  Patient has edema of her feet and her lower extremities with pitting up to her knees. She also states her thighs feel swollen but there is no pitting edema of her thighs.  Neurological: She is alert and oriented to person, place, and time. She has normal strength. No cranial nerve deficit.  Skin: Skin is warm, dry and intact. No rash noted. No erythema. There is pallor.  Psychiatric: She has a normal mood and affect. Her speech is normal and behavior is normal. Her mood appears not anxious.  Nursing  note and vitals reviewed.   ED Course  Procedures (including critical care time)  Medications  aspirin tablet 325 mg (not administered)  nitroGLYCERIN (NITROGLYN) 2 % ointment 1 inch (1 inch Topical Given 08/15/14 0433)  furosemide (LASIX) injection 60 mg (60 mg Intravenous Given 08/15/14 0457)   After patient was in the ED she started complaining of chest pain. Nitroglycerin paste was placed. When I checked her later she said her pain was improving. Although she is specifically told me she was not having chest pain during her initial interview she now states she has been having chest pain. Given test results. I am going to talk to the hospice about admitting for adjustment of her medications for  her congestive heart failure.  Patient also has had a drop of her hemoglobin of 2 g since January 1. She denies any rectal bleeding. She does states she had a bladder tumor removed recently by Dr. Tresa Moore and it had produced hematuria.  06:06 Dr Ernestina Patches, admit to [place], attending Dr Everette Rank, step down  Labs Review Results for orders placed or performed during the hospital encounter of 08/15/14  Comprehensive metabolic panel  Result Value Ref Range   Sodium 140 135 - 145  mmol/L   Potassium 3.7 3.5 - 5.1 mmol/L   Chloride 107 96 - 112 mmol/L   CO2 28 19 - 32 mmol/L   Glucose, Bld 115 (H) 70 - 99 mg/dL   BUN 29 (H) 6 - 23 mg/dL   Creatinine, Ser 1.23 (H) 0.50 - 1.10 mg/dL   Calcium 8.7 8.4 - 10.5 mg/dL   Total Protein 5.6 (L) 6.0 - 8.3 g/dL   Albumin 2.9 (L) 3.5 - 5.2 g/dL   AST 18 0 - 37 U/L   ALT 13 0 - 35 U/L   Alkaline Phosphatase 74 39 - 117 U/L   Total Bilirubin 0.6 0.3 - 1.2 mg/dL   GFR calc non Af Amer 39 (L) >90 mL/min   GFR calc Af Amer 45 (L) >90 mL/min   Anion gap 5 5 - 15  Brain natriuretic peptide  Result Value Ref Range   B Natriuretic Peptide 719.0 (H) 0.0 - 100.0 pg/mL  Troponin I  Result Value Ref Range   Troponin I <0.03 <0.031 ng/mL  CBC with Differential  Result Value Ref Range   WBC 4.5 4.0 - 10.5 K/uL   RBC 2.83 (L) 3.87 - 5.11 MIL/uL   Hemoglobin 9.1 (L) 12.0 - 15.0 g/dL   HCT 28.1 (L) 36.0 - 46.0 %   MCV 99.3 78.0 - 100.0 fL   MCH 32.2 26.0 - 34.0 pg   MCHC 32.4 30.0 - 36.0 g/dL   RDW 15.0 11.5 - 15.5 %   Platelets 194 150 - 400 K/uL   Neutrophils Relative % 61 43 - 77 %   Neutro Abs 2.8 1.7 - 7.7 K/uL   Lymphocytes Relative 23 12 - 46 %   Lymphs Abs 1.0 0.7 - 4.0 K/uL   Monocytes Relative 13 (H) 3 - 12 %   Monocytes Absolute 0.6 0.1 - 1.0 K/uL   Eosinophils Relative 2 0 - 5 %   Eosinophils Absolute 0.1 0.0 - 0.7 K/uL   Basophils Relative 1 0 - 1 %   Basophils Absolute 0.1 0.0 - 0.1 K/uL  Magnesium  Result Value Ref Range   Magnesium 2.3 1.5 - 2.5 mg/dL  POC occult blood, ED RN will collect  Result Value Ref Range   Fecal Occult Bld NEGATIVE NEGATIVE  Imaging Review Dg Chest Port 1 View  08/15/2014   CLINICAL DATA:  Shortness of breath and weakness.  EXAM: PORTABLE CHEST - 1 VIEW  COMPARISON:  07/18/2014  FINDINGS: Cardiac enlargement without vascular congestion. Small bilateral pleural effusions with basilar atelectasis. No pneumothorax. No focal consolidation. Calcification of the aorta.  IMPRESSION:  Cardiac enlargement. Small bilateral pleural effusions with basilar atelectasis.   Electronically Signed   By: Lucienne Capers M.D.   On: 08/15/2014 04:09     EKG Interpretation   Date/Time:  Tuesday August 15 2014 03:00:09 EST Ventricular Rate:  86 PR Interval:    QRS Duration: 87 QT Interval:  373 QTC Calculation: 446 R Axis:   87 Text Interpretation:  Atrial fibrillation Multiple ventricular premature  complexes Borderline right axis deviation Borderline repolarization  abnormality Since last tracing 11 Oct 2003 T wave inversion now evident in  Lateral leads Confirmed by Devlynn Knoff  MD-I, Lael Wetherbee ([id number]) on 08/15/2014 3:09:56  AM      MDM   Final diagnoses:  Acute congestive heart failure, unspecified congestive heart failure type  Chest pain, unspecified chest pain type  Anemia, unspecified anemia type    Plan admission  Rolland Porter, MD, Alanson Aly, MD 08/15/14 [phone]

## 2014-08-15 NOTE — ED Notes (Signed)
Unable to obtain IV access x 2  

## 2014-08-15 NOTE — ED Notes (Signed)
Patient resting comfortably in bed; family remains at bedside.

## 2014-08-15 NOTE — ED Notes (Signed)
Patient up to bedside commode; became very short of breath.

## 2014-08-15 NOTE — Telephone Encounter (Signed)
Pt admitted this am. cdm

## 2014-08-15 NOTE — Progress Notes (Signed)
  Echocardiogram 2D Echocardiogram has been performed.  [place], [place] 08/15/2014, 1:12 PM

## 2014-08-15 NOTE — ED Notes (Signed)
Patient c/o midsternal chest pain; 1" NTG paste applied to right chest.

## 2014-08-15 NOTE — Evaluation (Signed)
Received call from [place] about pt w/ dyspnea and CP  PCP: Emilee Hero Cards: [place]   Recently d/c'd from hospital on 07/22/14 for bladder tumor resection  Progressive LE swelling, DOE since this point Baseline afib (? Coumadin recently d/cd 2/2 tumor rescetion), CAD, diastolic CHF, MR w/ AI + cardiomegaly and pulm vasc congestion on CXR BNP 700 (no comparison) Hgb 9-hemoccult negative  Mild CP relieved w/ nitropaste Trop neg x1  EKG w/ afib-rate controlled-no signficant ST/T wave abnormalities S/p IV lasix  CP resolved per EDP Satting in mid 90s on RA w/ no increased WOB/resp distress/hypoxia-pt comfortable per EDP Hemodynamically stable per EDP  Accepted to stepdown bed Full dose ASA Will likely need cards c/s after 7am

## 2014-08-15 NOTE — Care Management Note (Addendum)
    Page 1 of 1   08/18/2014     10:20:52 AM CARE MANAGEMENT NOTE 08/18/2014  Patient:  Tracy, [name]   Account Number:  [id number]  Date Initiated:  08/15/2014  Documentation initiated by:  Jolene Provost  Subjective/Objective Assessment:   Pt is from home, lives alone and independent at baseline with strong family support. Pt has a walker that he uses PRN. Pt has no HH services or med needs prior to admission. Pt would like to use [facility] if needed.     Action/Plan:   Pt plans to discharge home with self care. Will continue to follow for CM needs. Pt may need home O2 assessment and PT eval.   Anticipated DC Date:  08/17/2014   Anticipated DC Plan:  [place]  CM consult      Choice offered to / List presented to:          [facility] arranged  HH-2 PT  HH-1 RN      [place].   Status of service:  Completed, signed off Medicare Important Message given?  YES (If response is "NO", the following Medicare IM given date fields will be blank) Date Medicare IM given:  08/18/2014 Medicare IM given by:  Jolene Provost Date Additional Medicare IM given:   Additional Medicare IM given by:    Discharge Disposition:  [place]  Per UR Regulation:    If discussed at Long Length of Stay Meetings, dates discussed:    Comments:  08/18/2014 [place], RN, MSN, CM Pt expected to discharge home over weekend. [facility] aware of referral.  Pt will need home O2 assessment prior to discharge. Staff left instructions on how to notify [facility] of discharge over weekend. No further CM needs at this time. 08/17/2014 [place], RN, MSN, CM  Per PT's recommendation pt will have [facility] RN and  PT at discharge. Romualdo Bolk, of [facility], per pt's choice, notified of referral. Will continue for CM needs. 08/15/2014 [place], RN, MSN, CM

## 2014-08-15 NOTE — ED Notes (Signed)
Shortness of breath and intermittent chest pain x 2 weeks, states worse tonight

## 2014-08-15 NOTE — Evaluation (Signed)
Occupational Therapy Evaluation Patient Details Name: Tracy [name] MRN: [id number] DOB: 06-16-1928 Today's Date: 08/15/2014    History of Present Illness 81 y/o female with a hx of diastolic chf, htn, bladder cancer s/p recent surgery last week who presents to the ED with progressive sob and LE edema. Pt lives alone but family lives next door to pt. Pt admitted to drinking ample amounts of water daily because "I like it." In the ED, pt was found to have an elevated BNP of 700 and O2 sats in the low 90's.   Clinical Impression   Patient is an 81 y/o female presenting to OT at baseline. Patient lives alone and states that her family lives next door and is frequently in and out. Patient reports that she does not leave her house very often. Pt appears to be completing ADL tasks and functional transfers at baseline. No further OT needs at this time.     Follow Up Recommendations  No OT follow up    Equipment Recommendations  None recommended by OT       Precautions / Restrictions Precautions Precautions: None Restrictions Weight Bearing Restrictions: No      Mobility Bed Mobility Overal bed mobility: Modified Independent                Transfers Overall transfer level: Independent               General transfer comment: Therapist provided stand by assist for safety.          ADL Overall ADL's : At baseline                                       General ADL Comments: Patient was able to complete LB dressing, toilet transfer, and toilet hygeine without assistance.                Pertinent Vitals/Pain Pain Assessment: No/denies pain     Hand Dominance Right   Extremity/Trunk Assessment Upper Extremity Assessment Upper Extremity Assessment: Overall WFL for tasks assessed;RUE deficits/detail RUE Deficits / Details: Pt reports prior deficits with R shoulder. Decreased ROM is baseline.    Lower Extremity Assessment Lower Extremity  Assessment: Defer to PT evaluation       Communication Communication Communication: No difficulties   Cognition Arousal/Alertness: Awake/alert Behavior During Therapy: WFL for tasks assessed/performed Overall Cognitive Status: Within Functional Limits for tasks assessed                                Home Living Family/patient expects to be discharged to:: Private residence Living Arrangements: Alone Available Help at Discharge: Family (family lives next door) Type of Home: House       Home Layout: One level     Bathroom Shower/Tub: Tub/shower unit (pt sponge bathes. Is too afraid to get in the shower.)   Bathroom Toilet: Standard     Home Equipment: Walker - 4 wheels   Additional Comments: Pt also has a lift recliner      Prior Functioning/Environment Level of Independence: Independent with assistive device(s)                                       End of Session    Activity Tolerance:  Patient tolerated treatment well Patient left: in bed;with call bell/phone within reach;Other (comment) (With ultrasound tech)   Time: 1638-4665 OT Time Calculation (min): 20 min Charges:  OT General Charges $OT Visit: 1 Procedure OT Evaluation $Initial OT Evaluation Tier I: 1 Procedure G-Codes:    Ailene Ravel, OTR/L,CBIS  [phone]  08/15/2014, 11:59 AM

## 2014-08-15 NOTE — ED Notes (Signed)
POC occult blood collected and tested with NEGATIVE result.

## 2014-08-16 DIAGNOSIS — I5033 Acute on chronic diastolic (congestive) heart failure: Principal | ICD-10-CM

## 2014-08-16 LAB — TROPONIN I: Troponin I: <0.03 ng/mL (ref <0.031)

## 2014-08-16 LAB — CBC
HCT: 28.3 % — ABNORMAL LOW (ref 36.0–46.0)
HEMOGLOBIN: 8.8 g/dL — AB (ref 12.0–15.0)
MCH: 30.7 pg (ref 26.0–34.0)
MCHC: 31.1 g/dL (ref 30.0–36.0)
MCV: 98.6 fL (ref 78.0–100.0)
Platelets: 205 10*3/uL (ref 150–400)
RBC: 2.87 MIL/uL — AB (ref 3.87–5.11)
RDW: 14.9 % (ref 11.5–15.5)
WBC: 5.3 10*3/uL (ref 4.0–10.5)

## 2014-08-16 LAB — COMPREHENSIVE METABOLIC PANEL
ALBUMIN: 2.5 g/dL — AB (ref 3.5–5.2)
ALT: 12 U/L (ref 0–35)
ANION GAP: 5 (ref 5–15)
AST: 16 U/L (ref 0–37)
Alkaline Phosphatase: 67 U/L (ref 39–117)
BILIRUBIN TOTAL: 0.5 mg/dL (ref 0.3–1.2)
BUN: 28 mg/dL — AB (ref 6–23)
CO2: 31 mmol/L (ref 19–32)
CREATININE: 1.2 mg/dL — AB (ref 0.50–1.10)
Calcium: 8.6 mg/dL (ref 8.4–10.5)
Chloride: 105 mmol/L (ref 96–112)
GFR calc non Af Amer: 40 mL/min — ABNORMAL LOW (ref >90)
GFR, EST AFRICAN AMERICAN: 47 mL/min — AB (ref >90)
GLUCOSE: 107 mg/dL — AB (ref 70–99)
POTASSIUM: 3.7 mmol/L (ref 3.5–5.1)
Sodium: 141 mmol/L (ref 135–145)
Total Protein: 4.9 g/dL — ABNORMAL LOW (ref 6.0–8.3)

## 2014-08-16 LAB — PROTIME-INR
INR: 1.1 (ref 0.00–1.49)
Prothrombin Time: 14.3 seconds (ref 11.6–15.2)

## 2014-08-16 MED ORDER — WARFARIN SODIUM 5 MG PO TABS
5.0000 mg | ORAL_TABLET | Freq: Once | ORAL | Status: AC
  Administered 2014-08-16: 5 mg via ORAL
  Filled 2014-08-16: qty 1

## 2014-08-16 MED ORDER — MECLIZINE HCL 12.5 MG PO TABS
12.5000 mg | ORAL_TABLET | Freq: Three times a day (TID) | ORAL | Status: DC | PRN
  Administered 2014-08-16: 12.5 mg via ORAL
  Filled 2014-08-16: qty 1

## 2014-08-16 MED ORDER — NITROGLYCERIN 0.4 MG SL SUBL: 0.4000 mg | SUBLINGUAL_TABLET | SUBLINGUAL | Status: DC | PRN

## 2014-08-16 MED ORDER — SODIUM CHLORIDE 0.9 % IJ SOLN
INTRAMUSCULAR | Status: AC
  Filled 2014-08-16: qty 30

## 2014-08-16 NOTE — Progress Notes (Signed)
[facility] for Coumadin Indication: atrial fibrillation  Allergies  Allergen Reactions  . Amoxicillin Other (See Comments)    Unknown  . Penicillins Other (See Comments)    Unknown  . Sulfonamide Derivatives Other (See Comments)    Unknown   Patient Measurements: Height: 5\' 3"  (160 cm) Weight: 151 lb 9.6 oz (68.765 kg) IBW/kg (Calculated) : 52.4  Vital Signs: Temp: 97.7 F (36.5 C) (01/27 0753) Temp Source: Oral (01/27 0753) BP: 142/74 mmHg (01/27 0720) Pulse Rate: 59 (01/27 0720)  Labs:  Recent Labs  08/15/14 0341 08/16/14 0439  HGB 9.1* 8.8*  HCT 28.1* 28.3*  PLT 194 205  LABPROT 14.3 14.3  INR 1.10 1.10  CREATININE 1.23* 1.20*  TROPONINI <0.03  --    Estimated Creatinine Clearance: 32.5 mL/min (by C-G formula based on Cr of 1.2).  Medical History: Past Medical History  Diagnosis Date  . Hyperlipidemia   . Coronary artery disease   . Venous insufficiency   . CHF (congestive heart failure)     acute diastolic heart failure  . Arrhythmia     chronic afib  . GERD (gastroesophageal reflux disease)   . Mitral valve prolapse   . Shoulder pain, right   . Swelling     both legs and feet  . Shortness of breath dyspnea   . Headache     occasional headache  . Arthritis   . Sleeping difficulties    Medications:  Prescriptions prior to admission  Medication Sig Dispense Refill Last Dose  . acetaminophen (TYLENOL) 500 MG tablet Take 500 mg by mouth every 6 (six) hours as needed for pain.   unknown  . aspirin 81 MG tablet Take 81 mg by mouth daily.   08/14/2014 at Unknown time  . calcium carbonate (OS-CAL) 600 MG TABS Take 600 mg by mouth daily at 12 noon.    08/14/2014 at Unknown time  . diltiazem (CARDIZEM CD) 120 MG 24 hr capsule TAKE (1) CAPSULE DAILY (Patient taking differently: TAKE (1) CAPSULE DAILY IN THE MORNING.) 30 capsule 6 08/14/2014 at Unknown time  . docusate sodium 100 MG CAPS Take 100 mg by mouth 2 (two) times  daily. 30 capsule 0 08/14/2014 at Unknown time  . furosemide (LASIX) 20 MG tablet Take 1 tablet (20 mg total) by mouth daily. (Patient taking differently: Take 20 mg by mouth daily at 12 noon. ) 30 tablet 11 08/14/2014 at Unknown time  . meclizine (ANTIVERT) 12.5 MG tablet Take 12.5 mg by mouth 3 (three) times daily as needed for dizziness.    unknown  . metoprolol (LOPRESSOR) 50 MG tablet Take 50 mg by mouth every morning.  4 08/14/2014 at 0800  . Multiple Vitamins-Minerals (MULTIVITAMINS THER. W/MINERALS) TABS Take 1 tablet by mouth daily at 12 noon.    08/14/2014 at Unknown time  . rosuvastatin (CRESTOR) 20 MG tablet Take 20 mg by mouth daily after lunch.    08/14/2014 at Unknown time   Assessment: 81yo female with h/o chronic Afib.  She has been on Coumadin in the past, however it was stopped for bladder surgery on 07/20/14 and held post-op for bleeding.  INR is at baseline as expected.  She was admitted to [facility] on 08/05/14 with HF exacerbation.  Pharmacy was asked to resume warfarin for afib.   Patient does not recall previous warfarin regimen.  Per outpatient [facility] clinic records she was suppose to be taking 2.5mg  daily except 5mg  on Fridays.  No bleeding noted.  Goal of Therapy:  INR 2-3   Plan:  Coumadin 5mg  po today x 1 INR daily  [place]illistonLavonia Drafts 08/16/2014,8:29 AM

## 2014-08-16 NOTE — Progress Notes (Signed)
Subjective: The patient is feeling some better. Prior to admission she developed shortness of breath and peripheral edema and came into ED with these symptoms. It was noted that she had bilateral pleural effusions BNP 700. She was admitted to ICU for further care and diuresis  Objective: Vital signs in last 24 hours: Temp:  [97.4 F (36.3 C)-98 F (36.7 C)] 97.4 F (36.3 C) (01/27 0400) Pulse Rate:  [35-120] 54 (01/27 0525) Resp:  [13-26] 13 (01/27 0525) BP: (105-156)/(42-108) 141/70 mmHg (01/27 0525) SpO2:  [79 %-100 %] 100 % (01/27 0525) Weight:  [68.765 kg (151 lb 9.6 oz)] 68.765 kg (151 lb 9.6 oz) (01/27 0500) Weight change: 9.798 kg (21 lb 9.6 oz) Last BM Date: 08/15/14  Intake/Output from previous day: 01/26 0701 - 01/27 0700 In: 203 [P.O.:200; I.V.:3] Out: 300 [Urine:300] Intake/Output this shift: Total I/O In: 200 [P.O.:200] Out: 225 [Urine:225]  Physical Exam: Gen. appearance the patient is awake and alert  H EENT negative  Neck supple no JVD or thyroid abnormalities  Heart regular rhythm no murmurs  Lungs clear to P&A  Abdomen no palpable organs or masses  Extremities free of edema   Recent Labs  08/15/14 0341 08/16/14 0439  WBC 4.5 5.3  HGB 9.1* 8.8*  HCT 28.1* 28.3*  PLT 194 205   BMET  Recent Labs  08/15/14 0341 08/16/14 0439  NA 140 141  K 3.7 3.7  CL 107 105  CO2 28 31  GLUCOSE 115* 107*  BUN 29* 28*  CREATININE 1.23* 1.20*  CALCIUM 8.7 8.6    Studies/Results: Dg Chest Port 1 View  08/15/2014   CLINICAL DATA:  Shortness of breath and weakness.  EXAM: PORTABLE CHEST - 1 VIEW  COMPARISON:  07/18/2014  FINDINGS: Cardiac enlargement without vascular congestion. Small bilateral pleural effusions with basilar atelectasis. No pneumothorax. No focal consolidation. Calcification of the aorta.  IMPRESSION: Cardiac enlargement. Small bilateral pleural effusions with basilar atelectasis.   Electronically Signed   By: Lucienne Capers M.D.   On:  08/15/2014 04:09    Medications:  . aspirin  325 mg Oral Daily  . calcium carbonate  1 tablet Oral Daily  . diltiazem  120 mg Oral Daily  . docusate sodium  100 mg Oral BID  . furosemide  40 mg Intravenous Daily  . metoprolol  50 mg Oral BID  . multivitamin with minerals  1 tablet Oral Q1200  . rosuvastatin  20 mg Oral QPC lunch  . sodium chloride  3 mL Intravenous Q12H  . sodium chloride      . Warfarin - Pharmacist Dosing Inpatient   Does not apply Q24H        Assessment/Plan: 1. Diastolic congestive heart failure and plan to Continue diuresis continued nasal O2 to proceed with 2-D echo-we'll obtain cardiology consult  2. Essential hypertension stable continue current medications  3. Atrial fibrillation with controlled rate. Continue warfarin dosed by pharmacy and metoprolol 50 mg twice a day will obtain cardiology consult   LOS: 1 day   Atavia Poppe G 08/16/2014, 6:38 AM

## 2014-08-16 NOTE — Evaluation (Signed)
Physical Therapy Evaluation Patient Details Name: Tracy [name] MRN: [id number] DOB: Dec 17, 1927 Today's Date: 08/16/2014   History of Present Illness  81 y/o female with a hx of diastolic chf, htn, bladder cancer s/p recent surgery last week who presents to the ED with progressive sob and LE edema. Pt lives alone but family lives next door to pt. Pt admitted to drinking ample amounts of water daily because "I like it." In the ED, pt was found to have an elevated BNP of 700 and O2 sats in the low 90's.  Clinical Impression  Pt is an 81 year old female who presents to PT with dx of chest pain.  During evaluation, pt was mod (I) with bed mobility skills and min guard (min guard primarily due to navigation around ICU lines) for transfers and gait with use of RW.  Gait distance limited secondary to fatigue; O2 sats at 92 after gait without supplemental O2.  Educated pt on possible need for HHPT services at discharge to ensure safety with functional mobility skills and develop exercise program for home, though pt declines.  Pt states her son in law has recently been laid off, and will be able to assist pt as much as needed (lives next door).  Recommend continued PT in the hospital to address strengthening and activity tolerance for safe return home.     Follow Up Recommendations Home health PT;Supervision for mobility/OOB (Recommended HHPT, though pt declines)    Equipment Recommendations  None recommended by PT       Precautions / Restrictions Precautions Precautions: Fall Restrictions Weight Bearing Restrictions: No      Mobility  Bed Mobility Overal bed mobility: Modified Independent                Transfers Overall transfer level: Needs assistance Equipment used: Rolling walker (2 wheeled) Transfers: Sit to/from [facility] Sit to Stand: Min guard Stand pivot transfers: Min guard          Ambulation/Gait Ambulation/Gait assistance: Min  guard Ambulation Distance (Feet): 15 Feet Assistive device: Rolling walker (2 wheeled) Gait Pattern/deviations: Trunk flexed;Step-through pattern;Decreased dorsiflexion - right;Decreased dorsiflexion - left   Gait velocity interpretation: Below normal speed for age/gender        Balance Overall balance assessment: Needs assistance Sitting-balance support: Feet supported;Single extremity supported Sitting balance-Leahy Scale: Good     Standing balance support: Bilateral upper extremity supported;During functional activity Standing balance-Leahy Scale: Fair                               Pertinent Vitals/Pain Pain Assessment: No/denies pain    Home Living Family/patient expects to be discharged to:: Private residence Living Arrangements: Alone Available Help at Discharge: Family;Available PRN/intermittently (Daughter and son in law live next door and check on pt frequently throughout the day) Type of Home: House Home Access: Stairs to enter Entrance Stairs-Rails: None Entrance Stairs-Number of Steps: 3 Home Layout: One level Home Equipment: Environmental consultant - 4 wheels;Wheelchair - manual (Lift chair)      Prior Function Level of Independence: Independent with assistive device(s);Needs assistance   Gait / Transfers Assistance Needed: Pt reprots mod (I) with bed mobility skills, transfers, and household amb with use of rollator.  Pt does use a personal manual W/C if needed around the home (sitting for cooking) or longer distances to MD visits.   ADL's / Homemaking Assistance Needed: Pt is mod (I) with ADLs.  Comments: Pt reprots her daughter does her shopping and driving.      Hand Dominance   Dominant Hand: Right    Extremity/Trunk Assessment   Upper Extremity Assessment: Defer to OT evaluation           Lower Extremity Assessment: Generalized weakness         Communication   Communication: No difficulties  Cognition Arousal/Alertness:  Awake/alert Behavior During Therapy: WFL for tasks assessed/performed Overall Cognitive Status: Within Functional Limits for tasks assessed                       Assessment/Plan    PT Assessment Patient needs continued PT services  PT Diagnosis Generalized weakness   PT Problem List Decreased strength;Decreased activity tolerance;Decreased balance;Decreased mobility  PT Treatment Interventions Gait training;Stair training;Functional mobility training;Therapeutic activities;Therapeutic exercise;Patient/family education;Balance training;Neuromuscular re-education   PT Goals (Current goals can be found in the Care Plan section) Acute Rehab PT Goals Patient Stated Goal: get rid of fluid on legs, walk better PT Goal Formulation: With patient Time For Goal Achievement: 08/30/14 Potential to Achieve Goals: Good    Frequency Min 3X/week    End of Session Equipment Utilized During Treatment: Gait belt Activity Tolerance: Patient limited by fatigue Patient left: in chair;with call bell/phone within reach;with chair alarm set Nurse Communication: Mobility status         Time: 6294-7654 PT Time Calculation (min) (ACUTE ONLY): 24 min   Charges:   PT Evaluation $Initial PT Evaluation Tier I: 1 Procedure     Lonna Cobb, DPT [phone]  08/16/2014, 10:49 AM

## 2014-08-16 NOTE — Consult Note (Signed)
Reason for Consult:short of breath Referring Physician: McInnis Cardiologist: Tracy [name] is an 81 y.o. female.  HPI: This is an 81 yo female patient of Dr. Angelena Form  with history of CAD, chronic atrial fibrillation managed with rate control medications and Coumadin, diastolic CHF, HTN and hyperlipidemia  She's had non-obstructive CAD by catheterization in 2003 with 40% mid LAD, plaque disease in other vessels. Echo August 2013 normal LV function, mild MR, Mild AI, mild AS, mild TR.Echo 08/15/14 mild to moderate LVH, EF 55-60%, mild-mod AS, mild AI & MR.Mild carotid disease by dopplers September 2014.   Patient had surgery for Bladder CA and was discharged in early January. Since then she's had 2-3 week history of worsening leg edema and shortness of breath. She was admitted with diastolic CHF. BNP 700, O2 sats low 90's. Troponins negative. She responded to Lasix. She had some chest pressure this am and was given Morphine which has made her swimmy headed and she can't think clearly. She has chest pressure at home, usually at rest. She can't tell me any details, how often, etc.    Past Medical History  Diagnosis Date  . Hyperlipidemia   . Coronary artery disease   . Venous insufficiency   . CHF (congestive heart failure)     acute diastolic heart failure  . Arrhythmia     chronic afib  . GERD (gastroesophageal reflux disease)   . Mitral valve prolapse   . Shoulder pain, right   . Swelling     both legs and feet  . Shortness of breath dyspnea   . Headache     occasional headache  . Arthritis   . Sleeping difficulties     Past Surgical History  Procedure Laterality Date  . Colonoscopy N/A 09/30/2012    Procedure: COLONOSCOPY;  Surgeon: Rogene Houston, MD;  Location: [facility];  Service: Endoscopy;  Laterality: N/A;  930  . Cardioversion      x2  . Back surgery      x2-lower back  . Transurethral resection of bladder tumor with gyrus (turbt-gyrus) N/A 07/19/2014     Procedure: TRANSURETHRAL RESECTION OF BLADDER TUMOR WITH GYRUS (TURBT-GYRUS) WITH CYSTOGRAM;  Surgeon: Alexis Frock, MD;  Location: [facility];  Service: Urology;  Laterality: N/A;  . Transurethral resection of bladder tumor with gyrus (turbt-gyrus) N/A 07/20/2014    Procedure: SECOND LOOK TRANSURETHRAL RESECTION OF BLADDER TUMOR WITH GYRUS (TURBT-GYRUS);  Surgeon: Alexis Frock, MD;  Location: [facility];  Service: Urology;  Laterality: N/A;    Family History  Problem Relation Age of Onset  . Colon cancer Neg Hx   . Colon polyps Sister     Social History:  reports that she has never smoked. She has never used smokeless tobacco. She reports that she does not drink alcohol or use illicit drugs.  Allergies:  Allergies  Allergen Reactions  . Amoxicillin Other (See Comments)    Unknown  . Penicillins Other (See Comments)    Unknown  . Sulfonamide Derivatives Other (See Comments)    Unknown    Medications:  Scheduled Meds: . aspirin  325 mg Oral Daily  . calcium carbonate  1 tablet Oral Daily  . diltiazem  120 mg Oral Daily  . docusate sodium  100 mg Oral BID  . furosemide  40 mg Intravenous Daily  . metoprolol  50 mg Oral BID  . multivitamin with minerals  1 tablet Oral Q1200  . rosuvastatin  20 mg Oral QPC lunch  .  sodium chloride  3 mL Intravenous Q12H  . sodium chloride      . warfarin  5 mg Oral Once  . Warfarin - Pharmacist Dosing Inpatient   Does not apply Q24H   Continuous Infusions:  PRN Meds:.acetaminophen **OR** acetaminophen, morphine injection, ondansetron **OR** ondansetron (ZOFRAN) IV   Results for orders placed or performed during the hospital encounter of 08/15/14 (from the past 48 hour(s))  Comprehensive metabolic panel     Status: Abnormal   Collection Time: 08/15/14  3:41 AM  Result Value Ref Range   Sodium 140 135 - 145 mmol/L   Potassium 3.7 3.5 - 5.1 mmol/L   Chloride 107 96 - 112 mmol/L   CO2 28 19 - 32 mmol/L   Glucose, Bld 115 (H) 70 - 99 mg/dL    BUN 29 (H) 6 - 23 mg/dL   Creatinine, Ser 1.23 (H) 0.50 - 1.10 mg/dL   Calcium 8.7 8.4 - 10.5 mg/dL   Total Protein 5.6 (L) 6.0 - 8.3 g/dL   Albumin 2.9 (L) 3.5 - 5.2 g/dL   AST 18 0 - 37 U/L   ALT 13 0 - 35 U/L   Alkaline Phosphatase 74 39 - 117 U/L   Total Bilirubin 0.6 0.3 - 1.2 mg/dL   GFR calc non Af Amer 39 (L) >90 mL/min   GFR calc Af Amer 45 (L) >90 mL/min    Comment: (NOTE) The eGFR has been calculated using the CKD EPI equation. This calculation has not been validated in all clinical situations. eGFR's persistently <90 mL/min signify possible Chronic Kidney Disease.    Anion gap 5 5 - 15  Troponin I     Status: None   Collection Time: 08/15/14  3:41 AM  Result Value Ref Range   Troponin I <0.03 <0.031 ng/mL    Comment:        NO INDICATION OF MYOCARDIAL INJURY.   CBC with Differential     Status: Abnormal   Collection Time: 08/15/14  3:41 AM  Result Value Ref Range   WBC 4.5 4.0 - 10.5 K/uL   RBC 2.83 (L) 3.87 - 5.11 MIL/uL   Hemoglobin 9.1 (L) 12.0 - 15.0 g/dL   HCT 28.1 (L) 36.0 - 46.0 %   MCV 99.3 78.0 - 100.0 fL   MCH 32.2 26.0 - 34.0 pg   MCHC 32.4 30.0 - 36.0 g/dL   RDW 15.0 11.5 - 15.5 %   Platelets 194 150 - 400 K/uL   Neutrophils Relative % 61 43 - 77 %   Neutro Abs 2.8 1.7 - 7.7 K/uL   Lymphocytes Relative 23 12 - 46 %   Lymphs Abs 1.0 0.7 - 4.0 K/uL   Monocytes Relative 13 (H) 3 - 12 %   Monocytes Absolute 0.6 0.1 - 1.0 K/uL   Eosinophils Relative 2 0 - 5 %   Eosinophils Absolute 0.1 0.0 - 0.7 K/uL   Basophils Relative 1 0 - 1 %   Basophils Absolute 0.1 0.0 - 0.1 K/uL  Magnesium     Status: None   Collection Time: 08/15/14  3:41 AM  Result Value Ref Range   Magnesium 2.3 1.5 - 2.5 mg/dL  Protime-INR     Status: None   Collection Time: 08/15/14  3:41 AM  Result Value Ref Range   Prothrombin Time 14.3 11.6 - 15.2 seconds   INR 1.10 0.00 - 1.49  Brain natriuretic peptide     Status: Abnormal   Collection Time: 08/15/14  3:42  AM  Result  Value Ref Range   B Natriuretic Peptide 719.0 (H) 0.0 - 100.0 pg/mL  POC occult blood, ED RN will collect     Status: None   Collection Time: 08/15/14  5:39 AM  Result Value Ref Range   Fecal Occult Bld NEGATIVE NEGATIVE  MRSA PCR Screening     Status: None   Collection Time: 08/15/14  8:30 AM  Result Value Ref Range   MRSA by PCR NEGATIVE NEGATIVE    Comment:        The GeneXpert MRSA Assay (FDA approved for NASAL specimens only), is one component of a comprehensive MRSA colonization surveillance program. It is not intended to diagnose MRSA infection nor to guide or monitor treatment for MRSA infections.   Comprehensive metabolic panel     Status: Abnormal   Collection Time: 08/16/14  4:39 AM  Result Value Ref Range   Sodium 141 135 - 145 mmol/L   Potassium 3.7 3.5 - 5.1 mmol/L   Chloride 105 96 - 112 mmol/L   CO2 31 19 - 32 mmol/L   Glucose, Bld 107 (H) 70 - 99 mg/dL   BUN 28 (H) 6 - 23 mg/dL   Creatinine, Ser 1.20 (H) 0.50 - 1.10 mg/dL   Calcium 8.6 8.4 - 10.5 mg/dL   Total Protein 4.9 (L) 6.0 - 8.3 g/dL   Albumin 2.5 (L) 3.5 - 5.2 g/dL   AST 16 0 - 37 U/L   ALT 12 0 - 35 U/L   Alkaline Phosphatase 67 39 - 117 U/L   Total Bilirubin 0.5 0.3 - 1.2 mg/dL   GFR calc non Af Amer 40 (L) >90 mL/min   GFR calc Af Amer 47 (L) >90 mL/min    Comment: (NOTE) The eGFR has been calculated using the CKD EPI equation. This calculation has not been validated in all clinical situations. eGFR's persistently <90 mL/min signify possible Chronic Kidney Disease.    Anion gap 5 5 - 15  CBC     Status: Abnormal   Collection Time: 08/16/14  4:39 AM  Result Value Ref Range   WBC 5.3 4.0 - 10.5 K/uL   RBC 2.87 (L) 3.87 - 5.11 MIL/uL   Hemoglobin 8.8 (L) 12.0 - 15.0 g/dL   HCT 28.3 (L) 36.0 - 46.0 %   MCV 98.6 78.0 - 100.0 fL   MCH 30.7 26.0 - 34.0 pg   MCHC 31.1 30.0 - 36.0 g/dL   RDW 14.9 11.5 - 15.5 %   Platelets 205 150 - 400 K/uL  Protime-INR     Status: None   Collection Time:  08/16/14  4:39 AM  Result Value Ref Range   Prothrombin Time 14.3 11.6 - 15.2 seconds   INR 1.10 0.00 - 1.49    Dg Chest Port 1 View  08/15/2014   CLINICAL DATA:  Shortness of breath and weakness.  EXAM: PORTABLE CHEST - 1 VIEW  COMPARISON:  07/18/2014  FINDINGS: Cardiac enlargement without vascular congestion. Small bilateral pleural effusions with basilar atelectasis. No pneumothorax. No focal consolidation. Calcification of the aorta.  IMPRESSION: Cardiac enlargement. Small bilateral pleural effusions with basilar atelectasis.   Electronically Signed   By: Lucienne Capers M.D.   On: 08/15/2014 04:09    ROS  See HPI Eyes: Negative Ears:positve for hearing loss, tinnitus Cardiovascular: positive for chest pain, palpitations,irregular heartbeat, dyspnea, dyspnea on exertion, negative near-syncope, orthopnea, paroxysmal nocturnal dyspnea and syncope,claudication, cyanosis,.  Respiratory:   Negative for cough, hemoptysis, sputum production and  wheezing.   Endocrine: Negative for cold intolerance and heat intolerance.  Hematologic/Lymphatic: Negative for adenopathy and bleeding problem. Does not bruise/bleed easily.  Musculoskeletal: Negative.   Gastrointestinal: Negative for nausea, vomiting, reflux, abdominal pain, diarrhea, constipation.   Genitourinary: Negative for bladder incontinence, dysuria, flank pain, frequency, hematuria, hesitancy, nocturia and urgency.  Neurological: Negative.  Allergic/Immunologic: Negative for environmental allergies.  Blood pressure 124/70, pulse 59, temperature 97.7 F (36.5 C), temperature source Oral, resp. rate 19, height 5' 3"  (1.6 m), weight 151 lb 9.6 oz (68.765 kg), SpO2 99 %. Physical Exam PHYSICAL EXAM: Well-nournished, in no acute distress. Neck: Increased JVD, HJR, Bruit, or thyroid enlargement Lungs: Decreased breath sounds at bases with a few crackles. Cardiovascular: Irreg irreg, PMI not displaced, 2/6 systolic murmur LSB, nogallops, bruit,  thrill, or heave. Abdomen: BS normal. Soft without organomegaly, masses, lesions or tenderness. Extremities: plus 2 edema bilaterally. oterhwise without cyanosis, clubbing. Decreased distal pulses bilateral SKin: Warm, no lesions or rashes  Musculoskeletal: No deformities Neuro: no focal signs  EKG: Atrial fibrillation with PVC's, no acute change.  2Decho 08/15/14 Study Conclusions  - Left ventricle: The cavity size was normal. Wall thickness was   increased increased in a pattern of mild to moderate LVH.   Systolic function was normal. The estimated ejection fraction was   in the range of 55% to 60%. Wall motion was normal; there were no   regional wall motion abnormalities. The study was not technically   sufficient to allow evaluation of LV diastolic dysfunction due to   atrial fibrillation. There is evidence of elevated LA pressure. - Aortic valve: Moderately calcified annulus. Trileaflet;   moderately thickened leaflets. There was mild to moderate   stenosis. There was mild regurgitation. The AI vena contracta is   0.2 cm . Valve area (VTI): 1.08 cm^2. Valve area (Vmax): 1.21   cm^2. Regurgitation pressure half-time: 553 ms. - Mitral valve: Mildly calcified annulus. Normal thickness leaflets   . There was mild regurgitation. - Left atrium: The atrium was severely dilated. - Pulmonary veins: There is systolic blunting of pulmonary vein   flow consistent with elevated LA pressure. - Right ventricle: The cavity size was mildly to moderately   dilated. - Right atrium: The atrium was severely dilated. - Atrial septum: A patent foramen ovale cannot be excluded. - Tricuspid valve: There was mild-moderate regurgitation. - Pulmonary arteries: Systolic pressure was moderately increased.   PA peak pressure: 49 mm Hg (S). - Technically difficult study.  Assessment/Plan: Acute on chronic diastolic CHF:has only diuresed 370 cc since admission. Still has a ways to go. Normal LV function  on 2Decho.Crt 1.2. Monitor.  Chest pain: negative troponins and EKG. History of nonobstructive CAD on cath 2003. Cycle enzymes, no current evidence of ACS. Symptoms somewhat positional.   Non-obstructive CAD  Chronic atrial fibrillation: rate controlled on  cardizem and metopolol. Coumadin resumed yest after recent bladder surger  Bladder CA S/P recent surgery  Mild to Moderate AS.  HTN: controlled  HLD: on Crestor  Anemia: Hgb 8.8. Follow trend after restarting coumadin.    Ermalinda Barrios 08/16/2014, 9:23 AM   Patient seen and discussed with PA Bonnell Public, I agree with her documentation. 81 yo female history of chronic diastolic HF, bladder CA with recent surgery, HL, chronic afib, non-obstructive CAD by cath 2003 admitted with SOB and LE edema.    Hgb 9.1 , Plt 194, WBC 4.5, K 3.7, Cr 1.23 (down from 1.74), GFR 39, BNP 719,  CXR cardiomegaly, small  bilateral pleural effusions 08/15/14 Echo: LVEF 55-60%, mod LVH, elevated LA pressures, mild to mod AS, mild AI, mild MR, mild to mod TR, PASP 49.  EKG afib with PVCs   Acute on chronic diastolic HF. Symptoms improved with IV diuresis, by charting only negative 300 mL since admission however unclear if accurate. Currently on IV lasix 87m daily, mild downtrend in Cr and BUN with diuresis consistent with venous congestion and CHF. Reports several month history of intermittent left sided chest pain, episode last night. Typically lasts just a few minutes, atypical in the sense often improved with leaning forward. No evidence of ACS at this time, continue to cycle enzymes. D/C prn morphine as she states this makes her feel very "swimmy headed", start prn SL NG.     JZandra AbtsMD

## 2014-08-17 LAB — CBC
HCT: 28.7 % — ABNORMAL LOW (ref 36.0–46.0)
HEMOGLOBIN: 8.9 g/dL — AB (ref 12.0–15.0)
MCH: 31 pg (ref 26.0–34.0)
MCHC: 31 g/dL (ref 30.0–36.0)
MCV: 100 fL (ref 78.0–100.0)
Platelets: 194 10*3/uL (ref 150–400)
RBC: 2.87 MIL/uL — AB (ref 3.87–5.11)
RDW: 15.1 % (ref 11.5–15.5)
WBC: 4.4 10*3/uL (ref 4.0–10.5)

## 2014-08-17 LAB — BASIC METABOLIC PANEL
ANION GAP: 3 — AB (ref 5–15)
BUN: 27 mg/dL — ABNORMAL HIGH (ref 6–23)
CALCIUM: 8.5 mg/dL (ref 8.4–10.5)
CO2: 31 mmol/L (ref 19–32)
Chloride: 107 mmol/L (ref 96–112)
Creatinine, Ser: 1.22 mg/dL — ABNORMAL HIGH (ref 0.50–1.10)
GFR calc Af Amer: 46 mL/min — ABNORMAL LOW (ref >90)
GFR calc non Af Amer: 40 mL/min — ABNORMAL LOW (ref >90)
Glucose, Bld: 98 mg/dL (ref 70–99)
Potassium: 3.8 mmol/L (ref 3.5–5.1)
SODIUM: 141 mmol/L (ref 135–145)

## 2014-08-17 LAB — MAGNESIUM: MAGNESIUM: 2.1 mg/dL (ref 1.5–2.5)

## 2014-08-17 LAB — PROTIME-INR
INR: 1.27 (ref 0.00–1.49)
Prothrombin Time: 16 seconds — ABNORMAL HIGH (ref 11.6–15.2)

## 2014-08-17 MED ORDER — WARFARIN SODIUM 5 MG PO TABS
5.0000 mg | ORAL_TABLET | Freq: Once | ORAL | Status: AC
Start: 2014-08-17 — End: 2014-08-17
  Administered 2014-08-17: 5 mg via ORAL
  Filled 2014-08-17: qty 1

## 2014-08-17 NOTE — Progress Notes (Signed)
[facility] for Coumadin Indication: atrial fibrillation  Allergies  Allergen Reactions  . Amoxicillin Other (See Comments)    Unknown  . Penicillins Other (See Comments)    Unknown  . Sulfonamide Derivatives Other (See Comments)    Unknown   Patient Measurements: Height: 5\' 3"  (160 cm) Weight: 149 lb 14.6 oz (68 kg) IBW/kg (Calculated) : 52.4  Vital Signs: Temp: 98.2 F (36.8 C) (01/28 0800) Temp Source: Oral (01/28 0800) BP: 106/91 mmHg (01/28 0400) Pulse Rate: 79 (01/28 0500)  Labs:  Recent Labs  08/15/14 0341 08/16/14 0439 08/16/14 0955 08/16/14 1524 08/16/14 2138 08/17/14 0439  HGB 9.1* 8.8*  --   --   --   --   HCT 28.1* 28.3*  --   --   --   --   PLT 194 205  --   --   --   --   LABPROT 14.3 14.3  --   --   --  16.0*  INR 1.10 1.10  --   --   --  1.27  CREATININE 1.23* 1.20*  --   --   --   --   TROPONINI <0.03  --  <0.03 <0.03 <0.03  --    Estimated Creatinine Clearance: 32.3 mL/min (by C-G formula based on Cr of 1.2).  Medical History: Past Medical History  Diagnosis Date  . Hyperlipidemia   . Coronary artery disease   . Venous insufficiency   . CHF (congestive heart failure)     acute diastolic heart failure  . Arrhythmia     chronic afib  . GERD (gastroesophageal reflux disease)   . Mitral valve prolapse   . Shoulder pain, right   . Swelling     both legs and feet  . Shortness of breath dyspnea   . Headache     occasional headache  . Arthritis   . Sleeping difficulties    Medications:  Prescriptions prior to admission  Medication Sig Dispense Refill Last Dose  . acetaminophen (TYLENOL) 500 MG tablet Take 500 mg by mouth every 6 (six) hours as needed for pain.   unknown  . aspirin 81 MG tablet Take 81 mg by mouth daily.   08/14/2014 at Unknown time  . calcium carbonate (OS-CAL) 600 MG TABS Take 600 mg by mouth daily at 12 noon.    08/14/2014 at Unknown time  . diltiazem (CARDIZEM CD) 120 MG 24 hr  capsule TAKE (1) CAPSULE DAILY (Patient taking differently: TAKE (1) CAPSULE DAILY IN THE MORNING.) 30 capsule 6 08/14/2014 at Unknown time  . docusate sodium 100 MG CAPS Take 100 mg by mouth 2 (two) times daily. 30 capsule 0 08/14/2014 at Unknown time  . furosemide (LASIX) 20 MG tablet Take 1 tablet (20 mg total) by mouth daily. (Patient taking differently: Take 20 mg by mouth daily at 12 noon. ) 30 tablet 11 08/14/2014 at Unknown time  . meclizine (ANTIVERT) 12.5 MG tablet Take 12.5 mg by mouth 3 (three) times daily as needed for dizziness.    unknown  . metoprolol (LOPRESSOR) 50 MG tablet Take 50 mg by mouth every morning.  4 08/14/2014 at 0800  . Multiple Vitamins-Minerals (MULTIVITAMINS THER. W/MINERALS) TABS Take 1 tablet by mouth daily at 12 noon.    08/14/2014 at Unknown time  . rosuvastatin (CRESTOR) 20 MG tablet Take 20 mg by mouth daily after lunch.    08/14/2014 at Unknown time   Assessment: 81yo female with h/o chronic Afib.  She has been on Coumadin in the past, however it was stopped for bladder surgery on 07/20/14 and held post-op for bleeding.  INR is at baseline as expected.  She was admitted to [facility] on 08/05/14 with HF exacerbation.  Pharmacy was asked to resume warfarin for afib.   Patient does not recall previous warfarin regimen.  Per outpatient [facility] clinic records she was last taking 2.5mg  daily except 5mg  on Fridays.  No bleeding noted.   Goal of Therapy:  INR 2-3   Plan:  Coumadin 5mg  po today x 1 INR daily  Deryl Ports, Lavonia Drafts 08/17/2014,8:28 AM

## 2014-08-17 NOTE — Progress Notes (Signed)
Subjective: 1. The patient had a fairly comfortable night she was admitted with the shortness of breath and does have acute on chronic diastolic congestive heart failure-chronic atrial fibrillation with controlled rate  Objective: Vital signs in last 24 hours: Temp:  [97.3 F (36.3 C)-97.7 F (36.5 C)] 97.5 F (36.4 C) (01/28 0400) Pulse Rate:  [33-88] 79 (01/28 0500) Resp:  [14-24] 21 (01/28 0500) BP: (90-142)/(49-91) 106/91 mmHg (01/28 0400) SpO2:  [92 %-100 %] 99 % (01/28 0500) Weight:  [68 kg (149 lb 14.6 oz)] 68 kg (149 lb 14.6 oz) (01/28 0500) Weight change: -0.765 kg (-1 lb 11 oz) Last BM Date: 08/15/14  Intake/Output from previous day: 01/27 0701 - 01/28 0700 In: -  Out: 1050 [Urine:1050] Intake/Output this shift: Total I/O In: -  Out: 250 [Urine:250]  Physical Exam: Gen. appearance the patient is awake and alert  H EENT negative  Neck supple no JVD or thyroid abnormalities  Heart irregular rhythm  Lungs clear to P&A  Abdomen no palpable organs or masses  Extremities mild edema   Recent Labs  08/15/14 0341 08/16/14 0439  WBC 4.5 5.3  HGB 9.1* 8.8*  HCT 28.1* 28.3*  PLT 194 205   BMET  Recent Labs  08/15/14 0341 08/16/14 0439  NA 140 141  K 3.7 3.7  CL 107 105  CO2 28 31  GLUCOSE 115* 107*  BUN 29* 28*  CREATININE 1.23* 1.20*  CALCIUM 8.7 8.6    Studies/Results: No results found.  Medications:  . aspirin  325 mg Oral Daily  . calcium carbonate  1 tablet Oral Daily  . diltiazem  120 mg Oral Daily  . docusate sodium  100 mg Oral BID  . furosemide  40 mg Intravenous Daily  . metoprolol  50 mg Oral BID  . multivitamin with minerals  1 tablet Oral Q1200  . rosuvastatin  20 mg Oral QPC lunch  . sodium chloride  3 mL Intravenous Q12H  . Warfarin - Pharmacist Dosing Inpatient   Does not apply Q24H        Assessment/Plan: 1. Diastolic congestive heart failure-continue diuresis  2. Essential hypertension controlled  controlled  Atrial fibrillation with controlled rate-continue warfarin dosed by pharmacy metoprolol 50 mg twice a day  Anemia-etiology undetermined-continue to monitor   LOS: 2 days   Smita Lesh G 08/17/2014, 6:10 AM

## 2014-08-17 NOTE — Progress Notes (Signed)
Consulting cardiologist:Branch, Roderic Palau, MD Primary Cardiologist: Lauree Chandler   Cardiology Specific Problem List 1. CHF 2. CAD 3. Chronic Atrial fibrillation-CHADS-Vasc 5    Subjective:    Feeling better. Edema is improved but complains of legs being sore.   Objective:   Temp:  [97.3 F (36.3 C)-98.2 F (36.8 C)] 98.2 F (36.8 C) (01/28 0800) Pulse Rate:  [33-88] 79 (01/28 0500) Resp:  [14-24] 21 (01/28 0500) BP: (90-139)/(49-91) 106/91 mmHg (01/28 0400) SpO2:  [92 %-100 %] 99 % (01/28 0500) Weight:  [149 lb 14.6 oz (68 kg)] 149 lb 14.6 oz (68 kg) (01/28 0500) Last BM Date: 08/15/14  Filed Weights   08/15/14 0248 08/16/14 0500 08/17/14 0500  Weight: 130 lb (58.968 kg) 151 lb 9.6 oz (68.765 kg) 149 lb 14.6 oz (68 kg)    Intake/Output Summary (Last 24 hours) at 08/17/14 0927 Last data filed at 08/17/14 0820  Gross per 24 hour  Intake      0 ml  Output    950 ml  Net   -950 ml    Telemetry: Atrial fibrillation rate in the 80's.   Exam:  General: No acute distress.  HEENT: Conjunctiva and lids normal, oropharynx clear.  Lungs: Clear to auscultation, diminished in the bases.  Cardiac: No elevated JVP or bruits. IRRR, 1/6 systolic murmur, no gallop or rub.   Abdomen: Normoactive bowel sounds, nontender, nondistended.  Extremities: No pitting edema, distal pulses full. Venous stasis skin darkening   Neuropsychiatric: Alert and oriented x3, affect appropriate.   Lab Results:  Basic Metabolic Panel:  Recent Labs Lab 08/15/14 0341 08/16/14 0439  NA 140 141  K 3.7 3.7  CL 107 105  CO2 28 31  GLUCOSE 115* 107*  BUN 29* 28*  CREATININE 1.23* 1.20*  CALCIUM 8.7 8.6  MG 2.3  --     Liver Function Tests:  Recent Labs Lab 08/15/14 0341 08/16/14 0439  AST 18 16  ALT 13 12  ALKPHOS 74 67  BILITOT 0.6 0.5  PROT 5.6* 4.9*  ALBUMIN 2.9* 2.5*    CBC:  Recent Labs Lab 08/15/14 0341 08/16/14 0439  WBC 4.5 5.3  HGB 9.1* 8.8*    HCT 28.1* 28.3*  MCV 99.3 98.6  PLT 194 205    Cardiac Enzymes:  Recent Labs Lab 08/16/14 0955 08/16/14 1524 08/16/14 2138  TROPONINI <0.03 <0.03 <0.03    Coagulation:  Recent Labs Lab 08/15/14 0341 08/16/14 0439 08/17/14 0439  INR 1.10 1.10 1.27     Medications:   Scheduled Medications: . aspirin  325 mg Oral Daily  . calcium carbonate  1 tablet Oral Daily  . diltiazem  120 mg Oral Daily  . docusate sodium  100 mg Oral BID  . furosemide  40 mg Intravenous Daily  . metoprolol  50 mg Oral BID  . multivitamin with minerals  1 tablet Oral Q1200  . rosuvastatin  20 mg Oral QPC lunch  . sodium chloride  3 mL Intravenous Q12H  . warfarin  5 mg Oral Once  . Warfarin - Pharmacist Dosing Inpatient   Does not apply Q24H    Echocardiogram 08/15/2014 Left ventricle: The cavity size was normal. Wall thickness was increased increased in a pattern of mild to moderate LVH. Systolic function was normal. The estimated ejection fraction was in the range of 55% to 60%. Wall motion was normal; there were no regional wall motion abnormalities. The study was not technically sufficient to allow evaluation of LV diastolic dysfunction  due to atrial fibrillation. There is evidence of elevated LA pressure. - Aortic valve: Moderately calcified annulus. Trileaflet; moderately thickened leaflets. There was mild to moderate stenosis. There was mild regurgitation. The AI vena contracta is 0.2 cm . Valve area (VTI): 1.08 cm^2. Valve area (Vmax): 1.21 cm^2. Regurgitation pressure half-time: 553 ms. - Mitral valve: Mildly calcified annulus. Normal thickness leaflets . There was mild regurgitation. - Left atrium: The atrium was severely dilated. - Pulmonary veins: There is systolic blunting of pulmonary vein flow consistent with elevated LA pressure. - Right ventricle: The cavity size was mildly to moderately dilated. - Right atrium: The atrium was severely  dilated. - Atrial septum: A patent foramen ovale cannot be excluded. - Tricuspid valve: There was mild-moderate regurgitation. - Pulmonary arteries: Systolic pressure was moderately increased. PA peak pressure: 49 mm Hg (S)    PRN Medications: acetaminophen **OR** acetaminophen, meclizine, nitroGLYCERIN, ondansetron **OR** ondansetron (ZOFRAN) IV   Assessment and Plan:   1.Acute on Chronic Diastolic CHF: She has diuresed 1.3 liters since admission with IV lasix 40 mg daily. Creatinine1.20; CO2 31.  Wt demonstrates 2 lb weight loss. She is feeling better, but has some mild pre-tibial edema. Review of home mediations has her on lasix 20 mg daily. She admits to dietary non-compliance with salt. Recommend education concerning diet restrictions  Last weight recorded on 07/22/2014 was 143 lbs, current wt 149 lbs. Continue IV lasix.   2. Atrial fib: On coumadin therapy which is just restarted. Hgb is stable but remains anemic. Heart rate is well controlled. BP low normal but stable.  3. CAD: Per cath in 2003, non-obstructive disease. Troponin is negative ruling out ACS. Continue current medication regimen.   4. Hypoalbuminemia: Contributing to fluid retention.   5. Anemia: Hgb stable. Restarted on coumadin.Watch this closely.      Phill Myron. Lawrence NP [place]  08/17/2014, 9:27 AM   Patient seen and discussed with NP Purcell Nails, agree with her documentation. Regarding the patient's acute on chronic diastolic HF she is negative 1 liter yesterday, negative 1.3 liters since admission. SOB is improving. Currently on lasix 40mg  IV daily, she has no repeat labs back this morning. BMET and Mg ordered. Atypical chest pain for several months, often better with sitting forward. No evidence of ACS by EKG or enzymes, no strong indication for inpatient ischemic testing at this time. Afib with normal rates, her coumadin has been restarted and is managed by pharm. Has some baseline anemia, follow H&H with  restarting coumadin.    Zandra Abts MD

## 2014-08-18 LAB — BASIC METABOLIC PANEL
BUN: 30 mg/dL — ABNORMAL HIGH (ref 6–23)
CHLORIDE: 106 mmol/L (ref 96–112)
CO2: 33 mmol/L — ABNORMAL HIGH (ref 19–32)
Calcium: 8.3 mg/dL — ABNORMAL LOW (ref 8.4–10.5)
Creatinine, Ser: 1.39 mg/dL — ABNORMAL HIGH (ref 0.50–1.10)
GFR calc Af Amer: 39 mL/min — ABNORMAL LOW (ref >90)
GFR, EST NON AFRICAN AMERICAN: 34 mL/min — AB (ref >90)
Glucose, Bld: 101 mg/dL — ABNORMAL HIGH (ref 70–99)
Potassium: 3.7 mmol/L (ref 3.5–5.1)
Sodium: 140 mmol/L (ref 135–145)

## 2014-08-18 LAB — CBC
HCT: 28.5 % — ABNORMAL LOW (ref 36.0–46.0)
HEMOGLOBIN: 9 g/dL — AB (ref 12.0–15.0)
MCH: 30.9 pg (ref 26.0–34.0)
MCHC: 31.6 g/dL (ref 30.0–36.0)
MCV: 97.9 fL (ref 78.0–100.0)
Platelets: 204 10*3/uL (ref 150–400)
RBC: 2.91 MIL/uL — ABNORMAL LOW (ref 3.87–5.11)
RDW: 15.1 % (ref 11.5–15.5)
WBC: 5.3 10*3/uL (ref 4.0–10.5)

## 2014-08-18 LAB — RETICULOCYTES
RBC.: 2.91 MIL/uL — ABNORMAL LOW (ref 3.87–5.11)
Retic Count, Absolute: 101.9 10*3/uL (ref 19.0–186.0)
Retic Ct Pct: 3.5 % — ABNORMAL HIGH (ref 0.4–3.1)

## 2014-08-18 LAB — VITAMIN B12: Vitamin B-12: 618 pg/mL (ref 211–911)

## 2014-08-18 LAB — PROTIME-INR
INR: 1.5 — ABNORMAL HIGH (ref 0.00–1.49)
Prothrombin Time: 18.3 seconds — ABNORMAL HIGH (ref 11.6–15.2)

## 2014-08-18 LAB — FOLATE

## 2014-08-18 LAB — FERRITIN: FERRITIN: 41 ng/mL (ref 10–291)

## 2014-08-18 MED ORDER — WARFARIN SODIUM 2.5 MG PO TABS
2.5000 mg | ORAL_TABLET | Freq: Once | ORAL | Status: AC
  Administered 2014-08-18: 2.5 mg via ORAL
  Filled 2014-08-18: qty 1

## 2014-08-18 NOTE — Care Management Utilization Note (Signed)
UR completed 

## 2014-08-18 NOTE — Progress Notes (Signed)
Subjective: The patient had a fairly good night with less shortness of breath. She does have acute on chronic diastolic congestive heart failure and chronic atrial fibrillation with controlled rate. She appears to be improving.  Objective: Vital signs in last 24 hours: Temp:  [97.2 F (36.2 C)-98.2 F (36.8 C)] 97.2 F (36.2 C) (01/29 0400) Pulse Rate:  [33-136] 86 (01/29 0500) Resp:  [18-39] 22 (01/29 0500) BP: (102-134)/(32-82) 120/81 mmHg (01/29 0400) SpO2:  [88 %-100 %] 100 % (01/29 0500) Weight:  [68 kg (149 lb 14.6 oz)] 68 kg (149 lb 14.6 oz) (01/29 0500) Weight change: 0 kg (0 lb) Last BM Date: 08/15/14  Intake/Output from previous day: 01/28 0701 - 01/29 0700 In: -  Out: 1400 [Urine:1400] Intake/Output this shift: Total I/O In: -  Out: 350 [Urine:350]  Physical Exam: Gen. appearance-the patient is awake and alert  H EENT negative  Neck supple no JVD or thyroid abnormalities  Heart irregular rhythm  Lungs clear to P&A  Abdomen no palpable organs or masses  Extremities mild edema   Recent Labs  08/17/14 0439 08/18/14 0445  WBC 4.4 5.3  HGB 8.9* 9.0*  HCT 28.7* 28.5*  PLT 194 204   BMET  Recent Labs  08/17/14 0439 08/18/14 0445  NA 141 140  K 3.8 3.7  CL 107 106  CO2 31 33*  GLUCOSE 98 101*  BUN 27* 30*  CREATININE 1.22* 1.39*  CALCIUM 8.5 8.3*    Studies/Results: No results found.  Medications:  . aspirin  325 mg Oral Daily  . calcium carbonate  1 tablet Oral Daily  . diltiazem  120 mg Oral Daily  . docusate sodium  100 mg Oral BID  . furosemide  40 mg Intravenous Daily  . metoprolol  50 mg Oral BID  . multivitamin with minerals  1 tablet Oral Q1200  . rosuvastatin  20 mg Oral QPC lunch  . sodium chloride  3 mL Intravenous Q12H  . Warfarin - Pharmacist Dosing Inpatient   Does not apply Q24H        Assessment/Plan: 1. Diastolic congestive heart failure-continue diuresis continue monitor chemistries  2. Essential hypertension  controlled  Atrial fibrillation with controlled rate continue warfarin dosed by pharmacy and metoprolol 50 mg twice a day  Anemia-etiology undetermined-hemoglobin now 9 g will obtain anemia panel   LOS: 3 days   Daphne Karrer G 08/18/2014, 6:32 AM

## 2014-08-18 NOTE — Progress Notes (Signed)
Report give to oncoming shift at this time so patient can be transferred to 323. Patient will be taken up by new shift by wheelchair.

## 2014-08-18 NOTE — Progress Notes (Signed)
[facility] for Coumadin Indication: atrial fibrillation  Allergies  Allergen Reactions  . Amoxicillin Other (See Comments)    Unknown  . Penicillins Other (See Comments)    Unknown  . Sulfonamide Derivatives Other (See Comments)    Unknown   Patient Measurements: Height: 5\' 3"  (160 cm) Weight: 149 lb 14.6 oz (68 kg) IBW/kg (Calculated) : 52.4  Vital Signs: Temp: 97.2 F (36.2 C) (01/29 0400) Temp Source: Axillary (01/29 0400) BP: 132/71 mmHg (01/29 0800) Pulse Rate: 101 (01/29 0700)  Labs:  Recent Labs  08/16/14 0439 08/16/14 0955 08/16/14 1524 08/16/14 2138 08/17/14 0439 08/18/14 0445  HGB 8.8*  --   --   --  8.9* 9.0*  HCT 28.3*  --   --   --  28.7* 28.5*  PLT 205  --   --   --  194 204  LABPROT 14.3  --   --   --  16.0* 18.3*  INR 1.10  --   --   --  1.27 1.50*  CREATININE 1.20*  --   --   --  1.22* 1.39*  TROPONINI  --  <0.03 <0.03 <0.03  --   --    Estimated Creatinine Clearance: 27.9 mL/min (by C-G formula based on Cr of 1.39).  Medical History: Past Medical History  Diagnosis Date  . Hyperlipidemia   . Coronary artery disease   . Venous insufficiency   . CHF (congestive heart failure)     acute diastolic heart failure  . Arrhythmia     chronic afib  . GERD (gastroesophageal reflux disease)   . Mitral valve prolapse   . Shoulder pain, right   . Swelling     both legs and feet  . Shortness of breath dyspnea   . Headache     occasional headache  . Arthritis   . Sleeping difficulties    Medications:  Prescriptions prior to admission  Medication Sig Dispense Refill Last Dose  . acetaminophen (TYLENOL) 500 MG tablet Take 500 mg by mouth every 6 (six) hours as needed for pain.   unknown  . aspirin 81 MG tablet Take 81 mg by mouth daily.   08/14/2014 at Unknown time  . calcium carbonate (OS-CAL) 600 MG TABS Take 600 mg by mouth daily at 12 noon.    08/14/2014 at Unknown time  . diltiazem (CARDIZEM CD) 120 MG 24  hr capsule TAKE (1) CAPSULE DAILY (Patient taking differently: TAKE (1) CAPSULE DAILY IN THE MORNING.) 30 capsule 6 08/14/2014 at Unknown time  . docusate sodium 100 MG CAPS Take 100 mg by mouth 2 (two) times daily. 30 capsule 0 08/14/2014 at Unknown time  . furosemide (LASIX) 20 MG tablet Take 1 tablet (20 mg total) by mouth daily. (Patient taking differently: Take 20 mg by mouth daily at 12 noon. ) 30 tablet 11 08/14/2014 at Unknown time  . meclizine (ANTIVERT) 12.5 MG tablet Take 12.5 mg by mouth 3 (three) times daily as needed for dizziness.    unknown  . metoprolol (LOPRESSOR) 50 MG tablet Take 50 mg by mouth every morning.  4 08/14/2014 at 0800  . Multiple Vitamins-Minerals (MULTIVITAMINS THER. W/MINERALS) TABS Take 1 tablet by mouth daily at 12 noon.    08/14/2014 at Unknown time  . rosuvastatin (CRESTOR) 20 MG tablet Take 20 mg by mouth daily after lunch.    08/14/2014 at Unknown time   Assessment: 81yo female with h/o chronic Afib.  She has been on Coumadin  in the past, however it was stopped for bladder surgery on 07/20/14 and held post-op for bleeding.  INR is at baseline as expected.  She was admitted to [facility] on 08/05/14 with HF exacerbation.  Pharmacy was asked to resume warfarin for afib.   Patient does not recall previous warfarin regimen.  Per outpatient [facility] clinic records she was last taking 2.5mg  daily except 5mg  on Fridays.  INR is trending up toward goal.   No bleeding noted.   Goal of Therapy:  INR 2-3   Plan:  Coumadin 2.5mg  po today x 1 INR daily  Gaven Eugene A 08/18/2014,10:11 AM

## 2014-08-18 NOTE — Progress Notes (Signed)
Patient ID: Tracy [name], female   DOB: 1928/03/27, 81 y.o.   MRN: [id number]     Subjective:    SOB improving  Objective:   Temp:  [97.2 F (36.2 C)-98.2 F (36.8 C)] 97.2 F (36.2 C) (01/29 0400) Pulse Rate:  [33-136] 101 (01/29 0700) Resp:  [14-39] 14 (01/29 0800) BP: (102-132)/(32-81) 132/71 mmHg (01/29 0800) SpO2:  [88 %-100 %] 100 % (01/29 0800) Weight:  [149 lb 14.6 oz (68 kg)] 149 lb 14.6 oz (68 kg) (01/29 0500) Last BM Date: 08/15/14  Filed Weights   08/16/14 0500 08/17/14 0500 08/18/14 0500  Weight: 151 lb 9.6 oz (68.765 kg) 149 lb 14.6 oz (68 kg) 149 lb 14.6 oz (68 kg)    Intake/Output Summary (Last 24 hours) at 08/18/14 0853 Last data filed at 08/18/14 0500  Gross per 24 hour  Intake      0 ml  Output   1200 ml  Net  -1200 ml    Telemetry: afib 70-90s  Exam:  General:NAD  Resp: CTAB  Cardiac:irreg, no m/r/g, no JVD  GI:abd soft, NT, ND  MSK: 1-2+ bilateral LE edema  Neuro: no focal deficits  Psych: appropriate affect  Lab Results:  Basic Metabolic Panel:  Recent Labs Lab 08/15/14 0341 08/16/14 0439 08/17/14 0439 08/18/14 0445  NA 140 141 141 140  K 3.7 3.7 3.8 3.7  CL 107 105 107 106  CO2 28 31 31  33*  GLUCOSE 115* 107* 98 101*  BUN 29* 28* 27* 30*  CREATININE 1.23* 1.20* 1.22* 1.39*  CALCIUM 8.7 8.6 8.5 8.3*  MG 2.3  --  2.1  --     Liver Function Tests:  Recent Labs Lab 08/15/14 0341 08/16/14 0439  AST 18 16  ALT 13 12  ALKPHOS 74 67  BILITOT 0.6 0.5  PROT 5.6* 4.9*  ALBUMIN 2.9* 2.5*    CBC:  Recent Labs Lab 08/16/14 0439 08/17/14 0439 08/18/14 0445  WBC 5.3 4.4 5.3  HGB 8.8* 8.9* 9.0*  HCT 28.3* 28.7* 28.5*  MCV 98.6 100.0 97.9  PLT 205 194 204    Cardiac Enzymes:  Recent Labs Lab 08/16/14 0955 08/16/14 1524 08/16/14 2138  TROPONINI <0.03 <0.03 <0.03    BNP: No results for input(s): PROBNP in the last 8760 hours.  Coagulation:  Recent Labs Lab 08/16/14 0439 08/17/14 0439  08/18/14 0445  INR 1.10 1.27 1.50*    ECG:   Medications:   Scheduled Medications: . aspirin  325 mg Oral Daily  . calcium carbonate  1 tablet Oral Daily  . diltiazem  120 mg Oral Daily  . docusate sodium  100 mg Oral BID  . furosemide  40 mg Intravenous Daily  . metoprolol  50 mg Oral BID  . multivitamin with minerals  1 tablet Oral Q1200  . rosuvastatin  20 mg Oral QPC lunch  . sodium chloride  3 mL Intravenous Q12H  . Warfarin - Pharmacist Dosing Inpatient   Does not apply Q24H     Infusions:     PRN Medications:  acetaminophen **OR** acetaminophen, meclizine, nitroGLYCERIN, ondansetron **OR** ondansetron (ZOFRAN) IV     Assessment/Plan     1. Acute on chronic diastolic HF - negative 1.4 liters yesterday, negative 2.5 liters since admission. She is on lasix 40mg  IV daily, Cr and BUN trending up but still below her fairly variable baseline of 1.5-2.5. Last IV dose today, would start oral lasix tomorrow 20mg  bid.   2. Atypical chest pain - ongoing for  several months. Symptoms better with leaning forward. No evidence of ACS. - no indication for ischemic testing at this time.   3. Afib - restarted on coumadin, low Hgb but stable trend. Anemia workup per primary team. She also had a recent surgery for bladder CA.     Carlyle Dolly, M.D.

## 2014-08-18 NOTE — Progress Notes (Signed)
Physical Therapy Treatment Patient Details Name: Tracy [name] MRN: [id number] DOB: 30-Apr-1928 Today's Date: 08/18/2014    History of Present Illness 81 y/o female with a hx of diastolic chf, htn, bladder cancer s/p recent surgery last week who presents to the ED with progressive sob and LE edema. Pt lives alone but family lives next door to pt. Pt admitted to drinking ample amounts of water daily because "I like it." In the ED, pt was found to have an elevated BNP of 700 and O2 sats in the low 90's.    PT Comments    Patient found supine in bed without nasal cannula or O2 sensor applied; nasal cannula replaced and patient's O2 measured, found to be 95% on room air at rest. Supine to sit with Mod(I). Patient's O2 and heart monitor transferred over to portable unit- however upon transfer portable unit did immediately begin to alarm that patient was having multiple PVCs. Transferred back to main monitor to confirm reading, no PVCs noted and HR/heart rhythm returned to WNL, maintained at normal consistently for extended period of time. Transferred patient to portable unit and attempted walking on room air, patient able to ambulate approximately 108f before O2 dropped to 86%, and portable unit began to indicate ventricular trigimeny, patient symptom free. Patient immediately returned to sitting with nasal cannula and switched back to main monitor to confirm readings; main monitor did show ventricular trigemeny but heart rhythm quickly returned to and maintained at normal with occasional A-fib. Cautiously attempted standing endurance tasks, heart remained normal with occasional missed beat and occasional A-fib, patient asymptomatic. Patient left upright in chair with all needs met on nasal cannula, nursing staff informed regarding patient's response to activity. Extensive patient education provided regarding monitoring of heart and O2 levels as well as need for caution with activity during this session.     Follow Up Recommendations  Home health PT;Supervision for mobility/OOB     Equipment Recommendations  None recommended by PT    Recommendations for Other Services       Precautions / Restrictions Precautions Precautions: Fall Restrictions Weight Bearing Restrictions: No    Mobility  Bed Mobility Overal bed mobility: Modified Independent                Transfers Overall transfer level: Needs assistance Equipment used: Rolling walker (2 wheeled) Transfers: Sit to/from Stand Sit to Stand: Min assist;Min guard         General transfer comment: Patient did demonstrate difficulty in standing from low surfaces on this date, Min guard from higher surfaces  Ambulation/Gait Ambulation/Gait assistance: Min guard Ambulation Distance (Feet): 10 Feet Assistive device: Rolling walker (2 wheeled) Gait Pattern/deviations: Decreased step length - right;Decreased step length - left;Trunk flexed;Decreased dorsiflexion - left;Decreased dorsiflexion - right   Gait velocity interpretation: Below normal speed for age/gender     Stairs            Wheelchair Mobility    Modified Rankin (Stroke Patients Only)       Balance Overall balance assessment: Needs assistance Sitting-balance support: No upper extremity supported Sitting balance-Leahy Scale: Good     Standing balance support: Bilateral upper extremity supported Standing balance-Leahy Scale: Fair                      Cognition Arousal/Alertness: Awake/alert Behavior During Therapy: WFL for tasks assessed/performed Overall Cognitive Status: Within Functional Limits for tasks assessed (However did appear mildly confused)  Exercises      General Comments        Pertinent Vitals/Pain Pain Assessment: No/denies pain    Home Living                      Prior Function            PT Goals (current goals can now be found in the care plan section) Acute  Rehab PT Goals Patient Stated Goal: get rid of fluid on legs, walk better PT Goal Formulation: With patient Time For Goal Achievement: 08/30/14 Potential to Achieve Goals: Good Progress towards PT goals: Progressing toward goals    Frequency  Min 3X/week    PT Plan Current plan remains appropriate    Co-evaluation             End of Session Equipment Utilized During Treatment: Gait belt;Oxygen Activity Tolerance: Patient tolerated treatment well Patient left: in chair;with call bell/phone within reach     Time: 1515-1555 PT Time Calculation (min) (ACUTE ONLY): 40 min  Charges:  $Gait Training: 8-22 mins $Therapeutic Activity: 23-37 mins                    G Codes:      Hunt Oris PT, DPT 08/18/2014, 4:05 PM

## 2014-08-19 LAB — IRON AND TIBC
Iron: 26 ug/dL — ABNORMAL LOW (ref 42–145)
Saturation Ratios: 8 % — ABNORMAL LOW (ref 20–55)
TIBC: 314 ug/dL (ref 250–470)
UIBC: 288 ug/dL (ref 125–400)

## 2014-08-19 LAB — BASIC METABOLIC PANEL
Anion gap: 8 (ref 5–15)
BUN: 31 mg/dL — AB (ref 6–23)
CO2: 32 mmol/L (ref 19–32)
CREATININE: 1.3 mg/dL — AB (ref 0.50–1.10)
Calcium: 8.7 mg/dL (ref 8.4–10.5)
Chloride: 101 mmol/L (ref 96–112)
GFR calc Af Amer: 42 mL/min — ABNORMAL LOW (ref >90)
GFR, EST NON AFRICAN AMERICAN: 37 mL/min — AB (ref >90)
GLUCOSE: 102 mg/dL — AB (ref 70–99)
POTASSIUM: 3.4 mmol/L — AB (ref 3.5–5.1)
SODIUM: 141 mmol/L (ref 135–145)

## 2014-08-19 LAB — CBC
HEMATOCRIT: 28.8 % — AB (ref 36.0–46.0)
HEMOGLOBIN: 9.1 g/dL — AB (ref 12.0–15.0)
MCH: 31 pg (ref 26.0–34.0)
MCHC: 31.6 g/dL (ref 30.0–36.0)
MCV: 98 fL (ref 78.0–100.0)
Platelets: 201 10*3/uL (ref 150–400)
RBC: 2.94 MIL/uL — AB (ref 3.87–5.11)
RDW: 15 % (ref 11.5–15.5)
WBC: 5.5 10*3/uL (ref 4.0–10.5)

## 2014-08-19 LAB — PROTIME-INR
INR: 1.86 — AB (ref 0.00–1.49)
Prothrombin Time: 21.6 seconds — ABNORMAL HIGH (ref 11.6–15.2)

## 2014-08-19 NOTE — Discharge Summary (Signed)
Physician Discharge Summary  Tracy [name] RKY:[id number] DOB: June 06, 1928 DOA: 08/15/2014  PCP: [name], MD  Admit date: 08/15/2014 Discharge date: 08/19/2014   Follow-up Information    Follow up with [facility].   Contact information:   [address] [place] [address] [phone]       Discharge Diagnoses:  1. 1. Acute on chronic diastolic congestive heart failure 2. Atypical chest pain 3. Atrial fibrillation 4. Anemia of chronic disease 5. Recent treatment for bladder cancer 6. Diastolic congestive heart failure 7. Hypertension essential  Discharge Condition: Stable Disposition: Home  Diet recommendation: Heart healthy diet  Filed Weights   08/17/14 0500 08/18/14 0500 08/19/14 0639  Weight: 68 kg (149 lb 14.6 oz) 68 kg (149 lb 14.6 oz) 69.083 kg (152 lb 4.8 oz)    History of present illness:  The patient initially presented to the ED with complaint of shortness of breath. She has a history of diastolic CHF hypertension recent bladder surgery for cancer bladder. She was found to have slightly elevated BNP 700 and oxygen sats were low 90s chest x-ray on admission showed bilateral pleural effusions with cardiac enlargement she received Lasix in ED and improve but was then admitted. To ICU  Hospital Course:  The patient was admitted to ICU she had improved on 1 dose of Lasix. In this was continued on a daily basis. She did have atrial fibrillation with controlled rate. Her anticoagulation was continued. She continued medications listed below. She was seen by cardiology and it was noted that her troponins remain negative. Prior ejection fraction was 55-60% on 2-D echo done 08/15/2014. It was felt that she had atypical chest pain with no evidence of ACS. The patient gradually improved with continued diuresis. She was moved out of ICU after 2 days to MedSurg floor. The patient's condition remained stable and was felt she did could be  discharged. Discharge Instructions The patient is to continue medications listed below. She is to go back on warfarin daily 2.5 mg. She is to make appointment with primary care physician on Tuesday for repeat prothrombin time and evaluation    Medication List    STOP taking these medications        aspirin 81 MG tablet      TAKE these medications        acetaminophen 500 MG tablet  Commonly known as:  TYLENOL  Take 500 mg by mouth every 6 (six) hours as needed for pain.     calcium carbonate 600 MG Tabs tablet  Commonly known as:  OS-CAL  Take 600 mg by mouth daily at 12 noon.     diltiazem 120 MG 24 hr capsule  Commonly known as:  CARDIZEM CD  TAKE (1) CAPSULE DAILY     DSS 100 MG Caps  Take 100 mg by mouth 2 (two) times daily.     furosemide 20 MG tablet  Commonly known as:  LASIX  Take 1 tablet (20 mg total) by mouth daily.     meclizine 12.5 MG tablet  Commonly known as:  ANTIVERT  Take 12.5 mg by mouth 3 (three) times daily as needed for dizziness.     metoprolol 50 MG tablet  Commonly known as:  LOPRESSOR  Take 50 mg by mouth every morning.     multivitamins ther. w/minerals Tabs tablet  Take 1 tablet by mouth daily at 12 noon.     rosuvastatin 20 MG tablet  Commonly known as:  CRESTOR  Take 20 mg by mouth daily after lunch.       Allergies  Allergen Reactions  . Amoxicillin Other (See Comments)    Unknown  . Penicillins Other (See Comments)    Unknown  . Sulfonamide Derivatives Other (See Comments)    Unknown    The results of significant diagnostics from this hospitalization (including imaging, microbiology, ancillary and laboratory) are listed below for reference.    Significant Diagnostic Studies: Dg Chest Port 1 View  08/15/2014   CLINICAL DATA:  Shortness of breath and weakness.  EXAM: PORTABLE CHEST - 1 VIEW  COMPARISON:  07/18/2014  FINDINGS: Cardiac enlargement without vascular congestion. Small bilateral pleural effusions with basilar  atelectasis. No pneumothorax. No focal consolidation. Calcification of the aorta.  IMPRESSION: Cardiac enlargement. Small bilateral pleural effusions with basilar atelectasis.   Electronically Signed   By: Lucienne Capers M.D.   On: 08/15/2014 04:09    Microbiology: Recent Results (from the past 240 hour(s))  MRSA PCR Screening     Status: None   Collection Time: 08/15/14  8:30 AM  Result Value Ref Range Status   MRSA by PCR NEGATIVE NEGATIVE Final    Comment:        The GeneXpert MRSA Assay (FDA approved for NASAL specimens only), is one component of a comprehensive MRSA colonization surveillance program. It is not intended to diagnose MRSA infection nor to guide or monitor treatment for MRSA infections.      Labs: Basic Metabolic Panel:  Recent Labs Lab 08/15/14 0341 08/16/14 0439 08/17/14 0439 08/18/14 0445 08/19/14 0622  NA 140 141 141 140 141  K 3.7 3.7 3.8 3.7 3.4*  CL 107 105 107 106 101  CO2 28 31 31  33* 32  GLUCOSE 115* 107* 98 101* 102*  BUN 29* 28* 27* 30* 31*  CREATININE 1.23* 1.20* 1.22* 1.39* 1.30*  CALCIUM 8.7 8.6 8.5 8.3* 8.7  MG 2.3  --  2.1  --   --    Liver Function Tests:  Recent Labs Lab 08/15/14 0341 08/16/14 0439  AST 18 16  ALT 13 12  ALKPHOS 74 67  BILITOT 0.6 0.5  PROT 5.6* 4.9*  ALBUMIN 2.9* 2.5*   No results for input(s): LIPASE, AMYLASE in the last 168 hours. No results for input(s): AMMONIA in the last 168 hours. CBC:  Recent Labs Lab 08/15/14 0341 08/16/14 0439 08/17/14 0439 08/18/14 0445 08/19/14 0622  WBC 4.5 5.3 4.4 5.3 5.5  NEUTROABS 2.8  --   --   --   --   HGB 9.1* 8.8* 8.9* 9.0* 9.1*  HCT 28.1* 28.3* 28.7* 28.5* 28.8*  MCV 99.3 98.6 100.0 97.9 98.0  PLT 194 205 194 204 201   Cardiac Enzymes:  Recent Labs Lab 08/15/14 0341 08/16/14 0955 08/16/14 1524 08/16/14 2138  TROPONINI <0.03 <0.03 <0.03 <0.03   BNP: BNP (last 3 results) No results for input(s): PROBNP in the last 8760 hours. CBG: No  results for input(s): GLUCAP in the last 168 hours.  Active Problems:   Essential hypertension   ATRIAL FIBRILLATION, CHRONIC   Acute diastolic heart failure   HYPERLIPIDEMIA TYPE IIB / III   MITRAL REGURG W/ AORTIC INSUFF, RHEUM/NON-RHEUM   Bladder cancer   Chest pain   Dyspnea   Acute on chronic diastolic congestive heart failure   Time coordinating discharge: 30 minutes  Signed:  Marjean Donna, MD 08/19/2014, 8:16 AM

## 2014-08-19 NOTE — Progress Notes (Signed)
Patient discharged home with family.  IV removed - WNL.  Reviewed medications with patient and family.  Instucted to make follow up with Dr. Everette Rank.  Heart failure home management discussed and care notes given.  Patient and family verbalize understanding and know when to call MD VS seek emergent care.  No questions at this time.  Patient stable to DC home - left floor via WC with NT assist.

## 2014-08-19 NOTE — Progress Notes (Signed)
Patient ambulated hallways on RA -  O2 sats 93% - 95%.  No home oxygen needed.  Patient has no complaints of SOB, appears to tolerate ambulation well.

## 2014-08-20 ENCOUNTER — Emergency Department (HOSPITAL_COMMUNITY): Payer: Medicare Other

## 2014-08-20 ENCOUNTER — Inpatient Hospital Stay (HOSPITAL_COMMUNITY)
Admission: EM | Admit: 2014-08-20 | Discharge: 2014-08-23 | DRG: 293 | Disposition: A | Discharge status: Home Health | Payer: Medicare Other | Attending: Family Medicine | Admitting: Family Medicine

## 2014-08-20 ENCOUNTER — Encounter (HOSPITAL_COMMUNITY): Payer: Self-pay | Admitting: *Deleted

## 2014-08-20 DIAGNOSIS — Z7901 Long term (current) use of anticoagulants: Secondary | ICD-10-CM | POA: Diagnosis not present

## 2014-08-20 DIAGNOSIS — D649 Anemia, unspecified: Secondary | ICD-10-CM | POA: Diagnosis present

## 2014-08-20 DIAGNOSIS — R51 Headache: Secondary | ICD-10-CM | POA: Diagnosis present

## 2014-08-20 DIAGNOSIS — I341 Nonrheumatic mitral (valve) prolapse: Secondary | ICD-10-CM | POA: Diagnosis present

## 2014-08-20 DIAGNOSIS — Z880 Allergy status to penicillin: Secondary | ICD-10-CM

## 2014-08-20 DIAGNOSIS — I872 Venous insufficiency (chronic) (peripheral): Secondary | ICD-10-CM | POA: Diagnosis present

## 2014-08-20 DIAGNOSIS — K219 Gastro-esophageal reflux disease without esophagitis: Secondary | ICD-10-CM | POA: Diagnosis present

## 2014-08-20 DIAGNOSIS — I5031 Acute diastolic (congestive) heart failure: Secondary | ICD-10-CM | POA: Diagnosis present

## 2014-08-20 DIAGNOSIS — I2510 Atherosclerotic heart disease of native coronary artery without angina pectoris: Secondary | ICD-10-CM | POA: Diagnosis present

## 2014-08-20 DIAGNOSIS — I350 Nonrheumatic aortic (valve) stenosis: Secondary | ICD-10-CM | POA: Diagnosis present

## 2014-08-20 DIAGNOSIS — N183 Chronic kidney disease, stage 3 unspecified: Secondary | ICD-10-CM | POA: Diagnosis present

## 2014-08-20 DIAGNOSIS — I10 Essential (primary) hypertension: Secondary | ICD-10-CM | POA: Diagnosis present

## 2014-08-20 DIAGNOSIS — N289 Disorder of kidney and ureter, unspecified: Secondary | ICD-10-CM

## 2014-08-20 DIAGNOSIS — D638 Anemia in other chronic diseases classified elsewhere: Secondary | ICD-10-CM | POA: Diagnosis present

## 2014-08-20 DIAGNOSIS — I5033 Acute on chronic diastolic (congestive) heart failure: Principal | ICD-10-CM | POA: Diagnosis present

## 2014-08-20 DIAGNOSIS — Z906 Acquired absence of other parts of urinary tract: Secondary | ICD-10-CM | POA: Diagnosis present

## 2014-08-20 DIAGNOSIS — M1990 Unspecified osteoarthritis, unspecified site: Secondary | ICD-10-CM | POA: Diagnosis present

## 2014-08-20 DIAGNOSIS — I509 Heart failure, unspecified: Secondary | ICD-10-CM

## 2014-08-20 DIAGNOSIS — Z79899 Other long term (current) drug therapy: Secondary | ICD-10-CM

## 2014-08-20 DIAGNOSIS — I129 Hypertensive chronic kidney disease with stage 1 through stage 4 chronic kidney disease, or unspecified chronic kidney disease: Secondary | ICD-10-CM | POA: Diagnosis present

## 2014-08-20 DIAGNOSIS — E785 Hyperlipidemia, unspecified: Secondary | ICD-10-CM | POA: Diagnosis present

## 2014-08-20 DIAGNOSIS — I482 Chronic atrial fibrillation: Secondary | ICD-10-CM | POA: Diagnosis present

## 2014-08-20 DIAGNOSIS — Z882 Allergy status to sulfonamides status: Secondary | ICD-10-CM | POA: Diagnosis not present

## 2014-08-20 DIAGNOSIS — C679 Malignant neoplasm of bladder, unspecified: Secondary | ICD-10-CM | POA: Diagnosis present

## 2014-08-20 DIAGNOSIS — R0602 Shortness of breath: Secondary | ICD-10-CM | POA: Diagnosis present

## 2014-08-20 DIAGNOSIS — I4891 Unspecified atrial fibrillation: Secondary | ICD-10-CM | POA: Diagnosis present

## 2014-08-20 HISTORY — DX: Unspecified atrial fibrillation: I48.91

## 2014-08-20 HISTORY — DX: Malignant neoplasm of bladder, unspecified: C67.9

## 2014-08-20 LAB — CBC WITH DIFFERENTIAL/PLATELET
BASOS ABS: 0 10*3/uL (ref 0.0–0.1)
Basophils Relative: 1 % (ref 0–1)
EOS ABS: 0.1 10*3/uL (ref 0.0–0.7)
EOS PCT: 2 % (ref 0–5)
HCT: 31.8 % — ABNORMAL LOW (ref 36.0–46.0)
Hemoglobin: 9.9 g/dL — ABNORMAL LOW (ref 12.0–15.0)
LYMPHS ABS: 1.4 10*3/uL (ref 0.7–4.0)
LYMPHS PCT: 25 % (ref 12–46)
MCH: 30.2 pg (ref 26.0–34.0)
MCHC: 31.1 g/dL (ref 30.0–36.0)
MCV: 97 fL (ref 78.0–100.0)
Monocytes Absolute: 0.5 10*3/uL (ref 0.1–1.0)
Monocytes Relative: 9 % (ref 3–12)
NEUTROS ABS: 3.5 10*3/uL (ref 1.7–7.7)
Neutrophils Relative %: 63 % (ref 43–77)
PLATELETS: 218 10*3/uL (ref 150–400)
RBC: 3.28 MIL/uL — ABNORMAL LOW (ref 3.87–5.11)
RDW: 15.1 % (ref 11.5–15.5)
WBC: 5.5 10*3/uL (ref 4.0–10.5)

## 2014-08-20 LAB — TSH: TSH: 4.947 u[IU]/mL — ABNORMAL HIGH (ref 0.350–4.500)

## 2014-08-20 LAB — BASIC METABOLIC PANEL
Anion gap: 5 (ref 5–15)
BUN: 33 mg/dL — AB (ref 6–23)
CO2: 32 mmol/L (ref 19–32)
Calcium: 8.8 mg/dL (ref 8.4–10.5)
Chloride: 103 mmol/L (ref 96–112)
Creatinine, Ser: 1.32 mg/dL — ABNORMAL HIGH (ref 0.50–1.10)
GFR calc Af Amer: 42 mL/min — ABNORMAL LOW (ref >90)
GFR calc non Af Amer: 36 mL/min — ABNORMAL LOW (ref >90)
Glucose, Bld: 115 mg/dL — ABNORMAL HIGH (ref 70–99)
POTASSIUM: 4.1 mmol/L (ref 3.5–5.1)
Sodium: 140 mmol/L (ref 135–145)

## 2014-08-20 LAB — TROPONIN I
Troponin I: <0.03 ng/mL (ref <0.031)
Troponin I: <0.03 ng/mL (ref <0.031)
Troponin I: <0.03 ng/mL (ref <0.031)

## 2014-08-20 LAB — BRAIN NATRIURETIC PEPTIDE: B Natriuretic Peptide: 466 pg/mL — ABNORMAL HIGH (ref 0.0–100.0)

## 2014-08-20 LAB — PROTIME-INR
INR: 1.96 — ABNORMAL HIGH (ref 0.00–1.49)
PROTHROMBIN TIME: 22.5 s — AB (ref 11.6–15.2)

## 2014-08-20 MED ORDER — WARFARIN - PHARMACIST DOSING INPATIENT
Status: DC
  Administered 2014-08-20 – 2014-08-21 (×2)

## 2014-08-20 MED ORDER — DOCUSATE SODIUM 100 MG PO CAPS
100.0000 mg | ORAL_CAPSULE | Freq: Two times a day (BID) | ORAL | Status: DC
  Administered 2014-08-20 – 2014-08-23 (×7): 100 mg via ORAL
  Filled 2014-08-20 (×7): qty 1

## 2014-08-20 MED ORDER — METOPROLOL TARTRATE 50 MG PO TABS
50.0000 mg | ORAL_TABLET | ORAL | Status: DC
  Administered 2014-08-20 – 2014-08-23 (×4): 50 mg via ORAL
  Filled 2014-08-20 (×5): qty 1

## 2014-08-20 MED ORDER — WARFARIN SODIUM 5 MG PO TABS
5.0000 mg | ORAL_TABLET | Freq: Once | ORAL | Status: AC
  Administered 2014-08-20: 5 mg via ORAL
  Filled 2014-08-20: qty 1

## 2014-08-20 MED ORDER — DILTIAZEM HCL ER COATED BEADS 120 MG PO CP24
120.0000 mg | ORAL_CAPSULE | Freq: Every day | ORAL | Status: DC
  Administered 2014-08-20 – 2014-08-23 (×4): 120 mg via ORAL
  Filled 2014-08-20 (×4): qty 1

## 2014-08-20 MED ORDER — SODIUM CHLORIDE 0.9 % IJ SOLN
3.0000 mL | Freq: Two times a day (BID) | INTRAMUSCULAR | Status: DC
  Administered 2014-08-20 – 2014-08-22 (×4): 3 mL via INTRAVENOUS

## 2014-08-20 MED ORDER — ASPIRIN 81 MG PO CHEW
324.0000 mg | CHEWABLE_TABLET | Freq: Once | ORAL | Status: AC
Start: 2014-08-20 — End: 2014-08-20
  Administered 2014-08-20: 324 mg via ORAL
  Filled 2014-08-20: qty 4

## 2014-08-20 MED ORDER — ACETAMINOPHEN 325 MG PO TABS
650.0000 mg | ORAL_TABLET | Freq: Four times a day (QID) | ORAL | Status: DC | PRN
Start: 2014-08-20 — End: 2014-08-23
  Administered 2014-08-23: 650 mg via ORAL
  Filled 2014-08-20: qty 2

## 2014-08-20 MED ORDER — ACETAMINOPHEN 650 MG RE SUPP: 650.0000 mg | Freq: Four times a day (QID) | RECTAL | Status: DC | PRN

## 2014-08-20 MED ORDER — NITROGLYCERIN 0.4 MG SL SUBL: 0.4000 mg | SUBLINGUAL_TABLET | SUBLINGUAL | Status: DC | PRN

## 2014-08-20 MED ORDER — FUROSEMIDE 10 MG/ML IJ SOLN
20.0000 mg | Freq: Two times a day (BID) | INTRAMUSCULAR | Status: DC
  Administered 2014-08-20 – 2014-08-22 (×5): 20 mg via INTRAVENOUS
  Filled 2014-08-20 (×5): qty 2

## 2014-08-20 MED ORDER — SODIUM CHLORIDE 0.9 % IV SOLN: 250.0000 mL | INTRAVENOUS | Status: DC | PRN

## 2014-08-20 MED ORDER — SODIUM CHLORIDE 0.9 % IJ SOLN
3.0000 mL | Freq: Two times a day (BID) | INTRAMUSCULAR | Status: DC
  Administered 2014-08-20 – 2014-08-23 (×6): 3 mL via INTRAVENOUS

## 2014-08-20 MED ORDER — ROSUVASTATIN CALCIUM 20 MG PO TABS
20.0000 mg | ORAL_TABLET | Freq: Every day | ORAL | Status: DC
  Administered 2014-08-20 – 2014-08-23 (×4): 20 mg via ORAL
  Filled 2014-08-20 (×4): qty 1

## 2014-08-20 MED ORDER — SODIUM CHLORIDE 0.9 % IJ SOLN: 3.0000 mL | INTRAMUSCULAR | Status: DC | PRN

## 2014-08-20 MED ORDER — MECLIZINE HCL 12.5 MG PO TABS: 12.5000 mg | ORAL_TABLET | Freq: Three times a day (TID) | ORAL | Status: DC | PRN

## 2014-08-20 MED ORDER — ENOXAPARIN SODIUM 30 MG/0.3ML ~~LOC~~ SOLN
30.0000 mg | SUBCUTANEOUS | Status: DC
  Administered 2014-08-20: 30 mg via SUBCUTANEOUS
  Filled 2014-08-20: qty 0.3

## 2014-08-20 MED ORDER — FUROSEMIDE 10 MG/ML IJ SOLN
40.0000 mg | Freq: Once | INTRAMUSCULAR | Status: AC
Start: 2014-08-20 — End: 2014-08-20
  Administered 2014-08-20: 40 mg via INTRAVENOUS
  Filled 2014-08-20: qty 4

## 2014-08-20 NOTE — Progress Notes (Signed)
[facility] for Coumadin (chronic Rx PTA) Indication: atrial fibrillation  Allergies  Allergen Reactions  . Amoxicillin Other (See Comments)    Unknown  . Penicillins Other (See Comments)    Unknown  . Sulfonamide Derivatives Other (See Comments)    Unknown   Patient Measurements: Height: 5\' 3"  (160 cm) Weight: 137 lb 14.4 oz (62.551 kg) IBW/kg (Calculated) : 52.4  Vital Signs: Temp: 97.5 F (36.4 C) (01/31 0708) Temp Source: Oral (01/31 0708) BP: 137/63 mmHg (01/31 0708) Pulse Rate: 98 (01/31 0708)  Labs:  Recent Labs  08/18/14 0445 08/19/14 0622 08/20/14 0228 08/20/14 0229 08/20/14 0734  HGB 9.0* 9.1*  --  9.9*  --   HCT 28.5* 28.8*  --  31.8*  --   PLT 204 201  --  218  --   LABPROT 18.3* 21.6* 22.5*  --   --   INR 1.50* 1.86* 1.96*  --   --   CREATININE 1.39* 1.30*  --  1.32*  --   TROPONINI  --   --   --  <0.03 <0.03   Estimated Creatinine Clearance: 26.2 mL/min (by C-G formula based on Cr of 1.32).  Medical History: Past Medical History  Diagnosis Date  . Hyperlipidemia   . Coronary artery disease   . Venous insufficiency   . CHF (congestive heart failure)     acute diastolic heart failure  . Arrhythmia     chronic afib  . GERD (gastroesophageal reflux disease)   . Mitral valve prolapse   . Shoulder pain, right   . Swelling     both legs and feet  . Shortness of breath dyspnea   . Headache     occasional headache  . Arthritis   . Sleeping difficulties   . Atrial fibrillation   . Bladder cancer    Medications:  Prescriptions prior to admission  Medication Sig Dispense Refill Last Dose  . acetaminophen (TYLENOL) 500 MG tablet Take 500 mg by mouth every 6 (six) hours as needed for pain.   unknown  . calcium carbonate (OS-CAL) 600 MG TABS Take 600 mg by mouth daily at 12 noon.    08/14/2014 at Unknown time  . diltiazem (CARDIZEM CD) 120 MG 24 hr capsule TAKE (1) CAPSULE DAILY (Patient taking differently: TAKE  (1) CAPSULE DAILY IN THE MORNING.) 30 capsule 6 08/14/2014 at Unknown time  . docusate sodium 100 MG CAPS Take 100 mg by mouth 2 (two) times daily. 30 capsule 0 08/14/2014 at Unknown time  . furosemide (LASIX) 20 MG tablet Take 1 tablet (20 mg total) by mouth daily. (Patient taking differently: Take 20 mg by mouth daily at 12 noon. ) 30 tablet 11 08/14/2014 at Unknown time  . meclizine (ANTIVERT) 12.5 MG tablet Take 12.5 mg by mouth 3 (three) times daily as needed for dizziness.    unknown  . metoprolol (LOPRESSOR) 50 MG tablet Take 50 mg by mouth every morning.  4 08/14/2014 at 0800  . Multiple Vitamins-Minerals (MULTIVITAMINS THER. W/MINERALS) TABS Take 1 tablet by mouth daily at 12 noon.    08/14/2014 at Unknown time  . rosuvastatin (CRESTOR) 20 MG tablet Take 20 mg by mouth daily after lunch.    08/14/2014 at Unknown time   Assessment: 81yo female with h/o chronic Afib.  She has been on Coumadin in the past, however it was stopped for bladder surgery on 07/20/14 and held post-op for bleeding.  Pt was recently discharged on Coumadin and readmitted  for worsening SOB.   No bleeding noted.   Goal of Therapy:  INR 2-3   Plan:  Coumadin 5mg  po today x 1 INR daily  Talita Recht A 08/20/2014,1:19 PM

## 2014-08-20 NOTE — H&P (Signed)
Tracy [name] is an 81 y.o. female.    Tracy [name] (pcp)  Chief Complaint: dyspnea HPI: 81 yo female with CAD, CHF (EF=60%), apparently c/o dyspnea.  Pt was discharged a day ago and then developed more sob. Slight orthopnea.  Pt is uncertain about her weight or bp at home.  Denies fever, chills, cp, palp, n/v, diarrhea, brbpr, black stool.  CXR => mild chf,  Trop negative.  Pt will be admitted for mild chf.   Past Medical History  Diagnosis Date  . Hyperlipidemia   . Coronary artery disease   . Venous insufficiency   . CHF (congestive heart failure)     acute diastolic heart failure  . Arrhythmia     chronic afib  . GERD (gastroesophageal reflux disease)   . Mitral valve prolapse   . Shoulder pain, right   . Swelling     both legs and feet  . Shortness of breath dyspnea   . Headache     occasional headache  . Arthritis   . Sleeping difficulties   . Atrial fibrillation   . Bladder cancer     Past Surgical History  Procedure Laterality Date  . Colonoscopy N/A 09/30/2012    Procedure: COLONOSCOPY;  Surgeon: Rogene Houston, MD;  Location: [facility];  Service: Endoscopy;  Laterality: N/A;  930  . Cardioversion      x2  . Back surgery      x2-lower back  . Transurethral resection of bladder tumor with gyrus (turbt-gyrus) N/A 07/19/2014    Procedure: TRANSURETHRAL RESECTION OF BLADDER TUMOR WITH GYRUS (TURBT-GYRUS) WITH CYSTOGRAM;  Surgeon: Alexis Frock, MD;  Location: [facility];  Service: Urology;  Laterality: N/A;  . Transurethral resection of bladder tumor with gyrus (turbt-gyrus) N/A 07/20/2014    Procedure: SECOND LOOK TRANSURETHRAL RESECTION OF BLADDER TUMOR WITH GYRUS (TURBT-GYRUS);  Surgeon: Alexis Frock, MD;  Location: [facility];  Service: Urology;  Laterality: N/A;  . Cataract extraction      Family History  Problem Relation Age of Onset  . Colon cancer Neg Hx   . Colon polyps Sister    Social History:  reports that she has never smoked. She has never used  smokeless tobacco. She reports that she does not drink alcohol or use illicit drugs.  Allergies:  Allergies  Allergen Reactions  . Amoxicillin Other (See Comments)    Unknown  . Penicillins Other (See Comments)    Unknown  . Sulfonamide Derivatives Other (See Comments)    Unknown   Medications reviewed  (Not in a hospital admission)  Results for orders placed or performed during the hospital encounter of 08/20/14 (from the past 48 hour(s))  Brain natriuretic peptide     Status: Abnormal   Collection Time: 08/20/14  2:28 AM  Result Value Ref Range   B Natriuretic Peptide 466.0 (H) 0.0 - 100.0 pg/mL  Basic metabolic panel     Status: Abnormal   Collection Time: 08/20/14  2:29 AM  Result Value Ref Range   Sodium 140 135 - 145 mmol/L   Potassium 4.1 3.5 - 5.1 mmol/L   Chloride 103 96 - 112 mmol/L   CO2 32 19 - 32 mmol/L   Glucose, Bld 115 (H) 70 - 99 mg/dL   BUN 33 (H) 6 - 23 mg/dL   Creatinine, Ser 1.32 (H) 0.50 - 1.10 mg/dL   Calcium 8.8 8.4 - 10.5 mg/dL   GFR calc non Af Amer 36 (L) >90 mL/min   GFR calc  Af Amer 42 (L) >90 mL/min    Comment: (NOTE) The eGFR has been calculated using the CKD EPI equation. This calculation has not been validated in all clinical situations. eGFR's persistently <90 mL/min signify possible Chronic Kidney Disease.    Anion gap 5 5 - 15  Troponin I     Status: None   Collection Time: 08/20/14  2:29 AM  Result Value Ref Range   Troponin I <0.03 <0.031 ng/mL    Comment:        NO INDICATION OF MYOCARDIAL INJURY.   CBC with Differential     Status: Abnormal   Collection Time: 08/20/14  2:29 AM  Result Value Ref Range   WBC 5.5 4.0 - 10.5 K/uL   RBC 3.28 (L) 3.87 - 5.11 MIL/uL   Hemoglobin 9.9 (L) 12.0 - 15.0 g/dL   HCT 31.8 (L) 36.0 - 46.0 %   MCV 97.0 78.0 - 100.0 fL   MCH 30.2 26.0 - 34.0 pg   MCHC 31.1 30.0 - 36.0 g/dL   RDW 15.1 11.5 - 15.5 %   Platelets 218 150 - 400 K/uL   Neutrophils Relative % 63 43 - 77 %   Neutro Abs 3.5  1.7 - 7.7 K/uL   Lymphocytes Relative 25 12 - 46 %   Lymphs Abs 1.4 0.7 - 4.0 K/uL   Monocytes Relative 9 3 - 12 %   Monocytes Absolute 0.5 0.1 - 1.0 K/uL   Eosinophils Relative 2 0 - 5 %   Eosinophils Absolute 0.1 0.0 - 0.7 K/uL   Basophils Relative 1 0 - 1 %   Basophils Absolute 0.0 0.0 - 0.1 K/uL   Dg Chest Port 1 View  08/20/2014   CLINICAL DATA:  Shortness of breath, chest pressure, and discomfort. Recent discharge from hospital yesterday.  EXAM: PORTABLE CHEST - 1 VIEW  COMPARISON:  08/15/2014  FINDINGS: Cardiac enlargement with pulmonary vascular congestion. Increasing perihilar edema since previous study. Increasing bilateral pleural effusions and basilar atelectasis since previous study. No pneumothorax. Calcification of aorta.  IMPRESSION: Worsening changes of congestive failure since previous study. Increased perihilar edema and increasing bilateral pleural effusions and basilar atelectasis.   Electronically Signed   By: Lucienne Capers M.D.   On: 08/20/2014 03:14    Review of Systems  Constitutional: Negative for fever, chills, weight loss, malaise/fatigue and diaphoresis.  HENT: Negative for congestion, ear discharge, ear pain, hearing loss, nosebleeds, sore throat and tinnitus.   Eyes: Negative for blurred vision, double vision, photophobia, pain, discharge and redness.  Respiratory: Positive for shortness of breath. Negative for cough, hemoptysis, sputum production, wheezing and stridor.   Cardiovascular: Negative for chest pain, palpitations, orthopnea, claudication, leg swelling and PND.  Gastrointestinal: Negative for heartburn, nausea, vomiting, abdominal pain, diarrhea, constipation, blood in stool and melena.  Genitourinary: Negative for dysuria, urgency, frequency, hematuria and flank pain.  Musculoskeletal: Negative for myalgias, back pain, joint pain, falls and neck pain.  Skin: Negative for itching and rash.  Neurological: Negative for dizziness, tingling, tremors,  sensory change, speech change, focal weakness, seizures, loss of consciousness, weakness and headaches.  Endo/Heme/Allergies: Negative for environmental allergies and polydipsia. Does not bruise/bleed easily.  Psychiatric/Behavioral: Negative for depression, suicidal ideas, hallucinations, memory loss and substance abuse. The patient is not nervous/anxious and does not have insomnia.     Blood pressure 153/76, pulse 69, temperature 97.6 F (36.4 C), resp. rate 20, weight 68.947 kg (152 lb), SpO2 95 %. Physical Exam  Constitutional: She is oriented  to person, place, and time. She appears well-developed and well-nourished.  HENT:  Head: Normocephalic and atraumatic.  Eyes: Conjunctivae and EOM are normal. Pupils are equal, round, and reactive to light.  Neck: Normal range of motion. Neck supple. JVD present. No tracheal deviation present. No thyromegaly present.  Cardiovascular: Exam reveals no gallop and no friction rub.   Murmur heard. Irr, irr, s1, s2, 2/6 sem apex  Respiratory: Effort normal. No respiratory distress. She has no wheezes. She has rales. She exhibits no tenderness.  GI: Soft. Bowel sounds are normal. She exhibits no distension. There is no tenderness. There is no rebound and no guarding.  Musculoskeletal: Normal range of motion. She exhibits no edema or tenderness.  Lymphadenopathy:    She has no cervical adenopathy.  Neurological: She is alert and oriented to person, place, and time. She has normal reflexes. She displays normal reflexes. No cranial nerve deficit. She exhibits normal muscle tone. Coordination normal.  Skin: Skin is warm and dry. No rash noted. No erythema. No pallor.  Psychiatric: She has a normal mood and affect. Her behavior is normal. Judgment and thought content normal.     Assessment/Plan CHF (EF 60%) Lasix iv Strict i and o Check daily weight Trop i q6h x3, tsh Pt recently had echo, will follow clinically  Anemia Check cbc in am  Pafib  chads2=3 Coumadin pharmacy to dose  Renal insufficiency Check cmp in am  DVT prophylaxis coumadin  Jani Gravel 08/20/2014, 6:10 AM

## 2014-08-20 NOTE — Progress Notes (Signed)
Subjective: The patient is more comfortable this morning she has less edema and less dyspnea. She was readmitted last night after having developed more shortness of breath and orthopnea. X-ray of chest shows evidence of increased edema and perihilar area.  Objective: Vital signs in last 24 hours: Temp:  [97.5 F (36.4 C)-97.6 F (36.4 C)] 97.5 F (36.4 C) (01/31 0708) Pulse Rate:  [57-100] 98 (01/31 0708) Resp:  [17-32] 20 (01/31 0708) BP: (120-159)/(55-93) 137/63 mmHg (01/31 0708) SpO2:  [89 %-98 %] 98 % (01/31 0708) Weight:  [62.551 kg (137 lb 14.4 oz)-68.947 kg (152 lb)] 62.551 kg (137 lb 14.4 oz) (01/31 0708) Weight change:     Intake/Output from previous day: 01/30 0701 - 01/31 0700 In: -  Out: 650 [Urine:650] Intake/Output this shift:    Physical Exam: Gen. appearance-the patient is oriented and does not appear to be in distress  HEENT negative  Neck supple mild JVD lungs rales at base of the lungs bilaterally  Heart slightly irregular rhythm  Abdomen no palpable organs or masses  Extremities mild edema bilaterally in lower extremities   Recent Labs  08/19/14 0622 08/20/14 0229  WBC 5.5 5.5  HGB 9.1* 9.9*  HCT 28.8* 31.8*  PLT 201 218   BMET  Recent Labs  08/19/14 0622 08/20/14 0229  NA 141 140  K 3.4* 4.1  CL 101 103  CO2 32 32  GLUCOSE 102* 115*  BUN 31* 33*  CREATININE 1.30* 1.32*  CALCIUM 8.7 8.8    Studies/Results: Dg Chest Port 1 View  08/20/2014   CLINICAL DATA:  Shortness of breath, chest pressure, and discomfort. Recent discharge from hospital yesterday.  EXAM: PORTABLE CHEST - 1 VIEW  COMPARISON:  08/15/2014  FINDINGS: Cardiac enlargement with pulmonary vascular congestion. Increasing perihilar edema since previous study. Increasing bilateral pleural effusions and basilar atelectasis since previous study. No pneumothorax. Calcification of aorta.  IMPRESSION: Worsening changes of congestive failure since previous study. Increased  perihilar edema and increasing bilateral pleural effusions and basilar atelectasis.   Electronically Signed   By: Lucienne Capers M.D.   On: 08/20/2014 03:14    Medications:  . diltiazem  120 mg Oral Daily  . docusate sodium  100 mg Oral BID  . enoxaparin (LOVENOX) injection  30 mg Subcutaneous Q24H  . furosemide  20 mg Intravenous BID  . metoprolol  50 mg Oral [id number]  . rosuvastatin  20 mg Oral QPC lunch  . sodium chloride  3 mL Intravenous Q12H  . sodium chloride  3 mL Intravenous Q12H        Assessment/Plan: 1. Diastolic congestive heart failure with mild pulmonary edema. Plan to continue furosemide will gradually increase dosage if needed  2. Chronic atrial fibrillation-we will continue Coumadin, diltiazem 120 mg daily, metoprolol 50 mg  3. Anemia to obtain further labs   LOS: 0 days   Jamin Humphries G 08/20/2014, 7:50 AM

## 2014-08-20 NOTE — Plan of Care (Signed)
Problem: Consults Goal: Heart Failure Patient Education (See Patient Education module for education specifics.)  Outcome: Completed/Met Date Met:  08/20/14 HF packet given on admission

## 2014-08-20 NOTE — ED Notes (Signed)
Up to bedside commode at pt insistence. O2 sats decreased to 88% on 2lnc with that exhertion. Returned to 96% while at rest back on stretcher.

## 2014-08-20 NOTE — Progress Notes (Signed)
Late entry:  Alerted by tele this afternoon that patient was having pauses on telemetry.  Pauses seem to be about 1.5 seconds long.  MD notified..  No new order at this time, continue to monitor.  Patient asymptomatic and VSS at this time.

## 2014-08-20 NOTE — ED Notes (Signed)
Pt was released from the hospital yesterday. Pt was admitted for congestive heart failure. Pt states she felt fine when she left but tonight when she went to lay down, she began feeling sob again.

## 2014-08-20 NOTE — ED Provider Notes (Signed)
CSN: [id number]     Arrival date & time 08/20/14  0131 History   First MD Initiated Contact with Patient 08/20/14 0214     Chief Complaint  Patient presents with  . Shortness of Breath     (Consider location/radiation/quality/duration/timing/severity/associated sxs/prior Treatment) Patient is a 81 y.o. female presenting with shortness of breath. The history is provided by the patient.  Shortness of Breath She had been hospitalized for congestive heart failure and was discharged at about 2 PM. She was doing well at home but woke up with difficulty breathing. Typically breathing is worse when laying flat. Is also a slight pressure sensation in the lower central chest. She rated that pressure feeling at 5/10. There is no associated nausea or diaphoresis. Symptoms are similar to what she had on admission to the hospital.  Past Medical History  Diagnosis Date  . Hyperlipidemia   . Coronary artery disease   . Venous insufficiency   . CHF (congestive heart failure)     acute diastolic heart failure  . Arrhythmia     chronic afib  . GERD (gastroesophageal reflux disease)   . Mitral valve prolapse   . Shoulder pain, right   . Swelling     both legs and feet  . Shortness of breath dyspnea   . Headache     occasional headache  . Arthritis   . Sleeping difficulties    Past Surgical History  Procedure Laterality Date  . Colonoscopy N/A 09/30/2012    Procedure: COLONOSCOPY;  Surgeon: Rogene Houston, MD;  Location: [facility];  Service: Endoscopy;  Laterality: N/A;  930  . Cardioversion      x2  . Back surgery      x2-lower back  . Transurethral resection of bladder tumor with gyrus (turbt-gyrus) N/A 07/19/2014    Procedure: TRANSURETHRAL RESECTION OF BLADDER TUMOR WITH GYRUS (TURBT-GYRUS) WITH CYSTOGRAM;  Surgeon: Alexis Frock, MD;  Location: [facility];  Service: Urology;  Laterality: N/A;  . Transurethral resection of bladder tumor with gyrus (turbt-gyrus) N/A 07/20/2014   Procedure: SECOND LOOK TRANSURETHRAL RESECTION OF BLADDER TUMOR WITH GYRUS (TURBT-GYRUS);  Surgeon: Alexis Frock, MD;  Location: [facility];  Service: Urology;  Laterality: N/A;   Family History  Problem Relation Age of Onset  . Colon cancer Neg Hx   . Colon polyps Sister    History  Substance Use Topics  . Smoking status: Never Smoker   . Smokeless tobacco: Never Used  . Alcohol Use: No   OB History    No data available     Review of Systems  Respiratory: Positive for shortness of breath.   All other systems reviewed and are negative.     Allergies  Amoxicillin; Penicillins; and Sulfonamide derivatives  Home Medications   Prior to Admission medications   Medication Sig Start Date End Date Taking? Authorizing Provider  acetaminophen (TYLENOL) 500 MG tablet Take 500 mg by mouth every 6 (six) hours as needed for pain.    Historical Provider, MD  calcium carbonate (OS-CAL) 600 MG TABS Take 600 mg by mouth daily at 12 noon.     Historical Provider, MD  diltiazem (CARDIZEM CD) 120 MG 24 hr capsule TAKE (1) CAPSULE DAILY Patient taking differently: TAKE (1) CAPSULE DAILY IN THE MORNING. 05/15/14   Burnell Blanks, MD  docusate sodium 100 MG CAPS Take 100 mg by mouth 2 (two) times daily. 07/22/14   Jorja Loa, MD  furosemide (LASIX) 20 MG tablet Take 1 tablet (  20 mg total) by mouth daily. Patient taking differently: Take 20 mg by mouth daily at 12 noon.  05/26/14   Burnell Blanks, MD  meclizine (ANTIVERT) 12.5 MG tablet Take 12.5 mg by mouth 3 (three) times daily as needed for dizziness.     Historical Provider, MD  metoprolol (LOPRESSOR) 50 MG tablet Take 50 mg by mouth every morning. 06/23/14   Historical Provider, MD  Multiple Vitamins-Minerals (MULTIVITAMINS THER. W/MINERALS) TABS Take 1 tablet by mouth daily at 12 noon.     Historical Provider, MD  rosuvastatin (CRESTOR) 20 MG tablet Take 20 mg by mouth daily after lunch.     Historical Provider, MD   BP  159/70 mmHg  Pulse 83  Temp(Src) 97.6 F (36.4 C)  Resp 20  Wt 152 lb (68.947 kg)  SpO2 95% Physical Exam  Nursing note and vitals reviewed.  81 year old female, resting comfortably and in no acute distress. Vital signs are significant for hypertension. Oxygen saturation is 88% on room air, which is hypoxic. After placing her on nasal oxygen, oxygen saturation improved to 95%. Head is normocephalic and atraumatic. PERRLA, EOMI. Oropharynx is clear. Neck is nontender and supple without adenopathy. JVD is present at 2. Back is nontender and there is no CVA tenderness. Lungs have faint by basilar rales, without wheezes or rhonchi. She does appear mildly dyspneic. Chest is nontender. Heart has regular rate and rhythm without murmur. Abdomen is soft, flat, nontender without masses or hepatosplenomegaly and peristalsis is normoactive. Extremities have 1+ edema, full range of motion is present. Skin is warm and dry without rash. Neurologic: Mental status is normal, cranial nerves are intact, there are no motor or sensory deficits.  ED Course  Procedures (including critical care time) Labs Review Results for orders placed or performed during the hospital encounter of 45/80/99  Basic metabolic panel  Result Value Ref Range   Sodium 140 135 - 145 mmol/L   Potassium 4.1 3.5 - 5.1 mmol/L   Chloride 103 96 - 112 mmol/L   CO2 32 19 - 32 mmol/L   Glucose, Bld 115 (H) 70 - 99 mg/dL   BUN 33 (H) 6 - 23 mg/dL   Creatinine, Ser 1.32 (H) 0.50 - 1.10 mg/dL   Calcium 8.8 8.4 - 10.5 mg/dL   GFR calc non Af Amer 36 (L) >90 mL/min   GFR calc Af Amer 42 (L) >90 mL/min   Anion gap 5 5 - 15  Troponin I  Result Value Ref Range   Troponin I <0.03 <0.031 ng/mL  Brain natriuretic peptide  Result Value Ref Range   B Natriuretic Peptide 466.0 (H) 0.0 - 100.0 pg/mL  CBC with Differential  Result Value Ref Range   WBC 5.5 4.0 - 10.5 K/uL   RBC 3.28 (L) 3.87 - 5.11 MIL/uL   Hemoglobin 9.9 (L) 12.0 -  15.0 g/dL   HCT 31.8 (L) 36.0 - 46.0 %   MCV 97.0 78.0 - 100.0 fL   MCH 30.2 26.0 - 34.0 pg   MCHC 31.1 30.0 - 36.0 g/dL   RDW 15.1 11.5 - 15.5 %   Platelets 218 150 - 400 K/uL   Neutrophils Relative % 63 43 - 77 %   Neutro Abs 3.5 1.7 - 7.7 K/uL   Lymphocytes Relative 25 12 - 46 %   Lymphs Abs 1.4 0.7 - 4.0 K/uL   Monocytes Relative 9 3 - 12 %   Monocytes Absolute 0.5 0.1 - 1.0 K/uL   Eosinophils  Relative 2 0 - 5 %   Eosinophils Absolute 0.1 0.0 - 0.7 K/uL   Basophils Relative 1 0 - 1 %   Basophils Absolute 0.0 0.0 - 0.1 K/uL   Imaging Review Dg Chest Port 1 View  08/20/2014   CLINICAL DATA:  Shortness of breath, chest pressure, and discomfort. Recent discharge from hospital yesterday.  EXAM: PORTABLE CHEST - 1 VIEW  COMPARISON:  08/15/2014  FINDINGS: Cardiac enlargement with pulmonary vascular congestion. Increasing perihilar edema since previous study. Increasing bilateral pleural effusions and basilar atelectasis since previous study. No pneumothorax. Calcification of aorta.  IMPRESSION: Worsening changes of congestive failure since previous study. Increased perihilar edema and increasing bilateral pleural effusions and basilar atelectasis.   Electronically Signed   By: Lucienne Capers M.D.   On: 08/20/2014 03:14     EKG Interpretation   Date/Time:  Sunday August 20 2014 02:46:09 EST Ventricular Rate:  77 PR Interval:    QRS Duration: 88 QT Interval:  385 QTC Calculation: 436 R Axis:   70 Text Interpretation:  Atrial fibrillation Ventricular premature complex  RSR' in V1 or V2, probably normal variant Borderline repolarization  abnormality When compared with ECG of 08/15/2014, No significant change was  found Confirmed by [facility]  MD, Takumi Din ([id number]) on 08/20/2014 5:57:11 AM      MDM   Final diagnoses:  Shortness of breath  CHF exacerbation  Normochromic normocytic anemia  Renal insufficiency    CHF exacerbation. Chest pain which may indicate myocardial ischemia. Old  records are reviewed confirming a hospitalization for congestive heart failure with discharge on January 30. ECG and laboratory and elevation and x-ray will be repeated. Been told to stop taking aspirin on discharge, so she is given a dose of aspirin as well as a dose of nitroglycerin.  Laboratory workup shows stable renal insufficiency and actually improvement in BNP. However, chest x-ray appears to show worsening interstitial edema. The x-ray picture is closer to her clinical presentation than the laboratory picture so she is given a dose of furosemide in the ED. She had only modest diuresis but oxygen saturation did improve. Off oxygen, her saturations stayed at 94-96% she was ambulated and with walking only about 20 feet, oxygen saturation dropped to 89%. It was felt that she was not stable for discharge at this point so arrangements are made for admission. Case is discussed with Dr. Maudie Mercury of [facility] who agrees to admit the patient.  Delora Fuel, MD 54/00/86 [id number]

## 2014-08-21 DIAGNOSIS — C679 Malignant neoplasm of bladder, unspecified: Secondary | ICD-10-CM

## 2014-08-21 DIAGNOSIS — I2510 Atherosclerotic heart disease of native coronary artery without angina pectoris: Secondary | ICD-10-CM

## 2014-08-21 DIAGNOSIS — Z7901 Long term (current) use of anticoagulants: Secondary | ICD-10-CM

## 2014-08-21 DIAGNOSIS — E785 Hyperlipidemia, unspecified: Secondary | ICD-10-CM

## 2014-08-21 DIAGNOSIS — I5033 Acute on chronic diastolic (congestive) heart failure: Principal | ICD-10-CM

## 2014-08-21 DIAGNOSIS — I10 Essential (primary) hypertension: Secondary | ICD-10-CM

## 2014-08-21 DIAGNOSIS — N189 Chronic kidney disease, unspecified: Secondary | ICD-10-CM

## 2014-08-21 DIAGNOSIS — I350 Nonrheumatic aortic (valve) stenosis: Secondary | ICD-10-CM

## 2014-08-21 DIAGNOSIS — I482 Chronic atrial fibrillation: Secondary | ICD-10-CM

## 2014-08-21 LAB — COMPREHENSIVE METABOLIC PANEL
ALBUMIN: 2.7 g/dL — AB (ref 3.5–5.2)
ALT: 13 U/L (ref 0–35)
AST: 19 U/L (ref 0–37)
Alkaline Phosphatase: 72 U/L (ref 39–117)
Anion gap: 8 (ref 5–15)
BUN: 29 mg/dL — ABNORMAL HIGH (ref 6–23)
CO2: 32 mmol/L (ref 19–32)
Calcium: 8.6 mg/dL (ref 8.4–10.5)
Chloride: 101 mmol/L (ref 96–112)
Creatinine, Ser: 1.18 mg/dL — ABNORMAL HIGH (ref 0.50–1.10)
GFR calc non Af Amer: 41 mL/min — ABNORMAL LOW (ref >90)
GFR, EST AFRICAN AMERICAN: 48 mL/min — AB (ref >90)
Glucose, Bld: 101 mg/dL — ABNORMAL HIGH (ref 70–99)
Potassium: 3.8 mmol/L (ref 3.5–5.1)
SODIUM: 141 mmol/L (ref 135–145)
Total Bilirubin: 0.6 mg/dL (ref 0.3–1.2)
Total Protein: 5.2 g/dL — ABNORMAL LOW (ref 6.0–8.3)

## 2014-08-21 LAB — CBC
HCT: 30.7 % — ABNORMAL LOW (ref 36.0–46.0)
Hemoglobin: 9.6 g/dL — ABNORMAL LOW (ref 12.0–15.0)
MCH: 30.5 pg (ref 26.0–34.0)
MCHC: 31.3 g/dL (ref 30.0–36.0)
MCV: 97.5 fL (ref 78.0–100.0)
PLATELETS: 230 10*3/uL (ref 150–400)
RBC: 3.15 MIL/uL — AB (ref 3.87–5.11)
RDW: 15.3 % (ref 11.5–15.5)
WBC: 5.9 10*3/uL (ref 4.0–10.5)

## 2014-08-21 LAB — PROTIME-INR
INR: 2.32 — ABNORMAL HIGH (ref 0.00–1.49)
PROTHROMBIN TIME: 25.6 s — AB (ref 11.6–15.2)

## 2014-08-21 MED ORDER — WARFARIN SODIUM 5 MG PO TABS
2.5000 mg | ORAL_TABLET | Freq: Once | ORAL | Status: AC
  Administered 2014-08-21: 2.5 mg via ORAL
  Filled 2014-08-21: qty 1

## 2014-08-21 NOTE — Progress Notes (Signed)
Subjective: The patient is more comfortable today with less edema and less dyspnea. She has been diuresing. She was readmitted because of increased dyspnea with x-ray showing evidence of increased edema and perihilar edema. She does have diastolic congestive heart failure.  Objective: Vital signs in last 24 hours: Temp:  [97.5 F (36.4 C)-98.3 F (36.8 C)] 98.3 F (36.8 C) (01/31 2110) Pulse Rate:  [42-98] 42 (01/31 2110) Resp:  [17-20] 20 (01/31 2110) BP: (109-140)/(51-71) 139/69 mmHg (01/31 2110) SpO2:  [9 %-99 %] 9 % (01/31 2152) Weight:  [62.551 kg (137 lb 14.4 oz)] 62.551 kg (137 lb 14.4 oz) (01/31 0708) Weight change: -6.396 kg (-14 lb 1.6 oz) Last BM Date: 08/20/14  Intake/Output from previous day: 01/31 0701 - 02/01 0700 In: 580 [P.O.:580] Out: 1600 [Urine:1600] Intake/Output this shift: Total I/O In: -  Out: 500 [Urine:500]  Physical Exam: Gen. appearance-the patient is oriented and does not to be in distress  HEENT negative  Neck supple no JVD  Heart irregular rhythm  Abdomen no palpable organs or masses  Extremities mild edema bilaterally   Recent Labs  08/19/14 0622 08/20/14 0229  WBC 5.5 5.5  HGB 9.1* 9.9*  HCT 28.8* 31.8*  PLT 201 218   BMET  Recent Labs  08/19/14 0622 08/20/14 0229  NA 141 140  K 3.4* 4.1  CL 101 103  CO2 32 32  GLUCOSE 102* 115*  BUN 31* 33*  CREATININE 1.30* 1.32*  CALCIUM 8.7 8.8    Studies/Results: Dg Chest Port 1 View  08/20/2014   CLINICAL DATA:  Shortness of breath, chest pressure, and discomfort. Recent discharge from hospital yesterday.  EXAM: PORTABLE CHEST - 1 VIEW  COMPARISON:  08/15/2014  FINDINGS: Cardiac enlargement with pulmonary vascular congestion. Increasing perihilar edema since previous study. Increasing bilateral pleural effusions and basilar atelectasis since previous study. No pneumothorax. Calcification of aorta.  IMPRESSION: Worsening changes of congestive failure since previous study.  Increased perihilar edema and increasing bilateral pleural effusions and basilar atelectasis.   Electronically Signed   By: Lucienne Capers M.D.   On: 08/20/2014 03:14    Medications:  . diltiazem  120 mg Oral Daily  . docusate sodium  100 mg Oral BID  . enoxaparin (LOVENOX) injection  30 mg Subcutaneous Q24H  . furosemide  20 mg Intravenous BID  . metoprolol  50 mg Oral [id number]  . rosuvastatin  20 mg Oral QPC lunch  . sodium chloride  3 mL Intravenous Q12H  . sodium chloride  3 mL Intravenous Q12H  . Warfarin - Pharmacist Dosing Inpatient   Does not apply Q24H        Assessment/Plan: 1. Diastolic congestive heart failure with pulmonary edema-improved with increased dosage of furosemide-we'll recheck chemistries obtain cardiology consult  2. Chronic atrial fibrillation-we will continue Coumadin diltiazem and metoprolol  3. Anemia which has improved continue to monitor   LOS: 1 day   Anays Detore G 08/21/2014, 6:05 AM

## 2014-08-21 NOTE — Progress Notes (Signed)
UR chart review completed.  

## 2014-08-21 NOTE — Care Management Note (Signed)
    Page 1 of 2   08/23/2014     10:44:12 AM CARE MANAGEMENT NOTE 08/23/2014  Patient:  ALIYANNA, WASSMER   Account Number:  [id number]  Date Initiated:  08/21/2014  Documentation initiated by:  Theophilus Kinds  Subjective/Objective Assessment:   Pt admitted from home with CHF. Pt lives alone and has a daughter who is very active in the care of the pt. Pt will return home at discharge. Pt stated that she is fairly independent with ADL's. Pt has a walker for home use and is active     Action/Plan:   with [facility] RN and PT. Will continue to follow for discharge planning needs. Pt may need home O2 at discharge.   Anticipated DC Date:  08/23/2014   Anticipated DC Plan:  [place]  CM consult      [facility] Choice  Resumption Of Svcs/PTA Provider   Choice offered to / List presented to:  C-1 Patient        [place] arranged  HH-1 RN  [place] PT      [place].   Status of service:  Completed, signed off Medicare Important Message given?  YES (If response is "NO", the following Medicare IM given date fields will be blank) Date Medicare IM given:  08/23/2014 Medicare IM given by:  Theophilus Kinds Date Additional Medicare IM given:   Additional Medicare IM given by:    Discharge Disposition:  [place]  Per UR Regulation:    If discussed at Long Length of Stay Meetings, dates discussed:    Comments:  08/23/14 [place], RN BSN CM Pt discharged home today with resumption of [facility] RN and PT (per pt choice). Romualdo Bolk of [facility] is aware and will collect the pts information from the chart. [place] services to start within 48 hours of discharge. No DME needs noted as pt does not qualify for home O2 at this time per RN documentation. Pt and pts nurse aware of discharge arrangements.  08/21/14 [place], RN BSN CM

## 2014-08-21 NOTE — Progress Notes (Signed)
[place] for Coumadin (chronic Rx PTA) Indication: atrial fibrillation  Allergies  Allergen Reactions  . Amoxicillin Other (See Comments)    Unknown  . Penicillins Other (See Comments)    Unknown  . Sulfonamide Derivatives Other (See Comments)    Unknown   Patient Measurements: Height: 5\' 3"  (160 cm) Weight: 135 lb 6.4 oz (61.417 kg) IBW/kg (Calculated) : 52.4  Vital Signs: Temp: 98.1 F (36.7 C) (02/01 0613) Temp Source: Oral (02/01 0613) BP: 133/62 mmHg (02/01 0613) Pulse Rate: 51 (02/01 0613)  Labs:  Recent Labs  08/19/14 0622 08/20/14 0228  08/20/14 0229 08/20/14 0734 08/20/14 1309 08/20/14 1915 08/21/14 0558  HGB 9.1*  --   --  9.9*  --   --   --  9.6*  HCT 28.8*  --   --  31.8*  --   --   --  30.7*  PLT 201  --   --  218  --   --   --  230  LABPROT 21.6* 22.5*  --   --   --   --   --  25.6*  INR 1.86* 1.96*  --   --   --   --   --  2.32*  CREATININE 1.30*  --   --  1.32*  --   --   --  1.18*  TROPONINI  --   --   < > <0.03 <0.03 <0.03 <0.03  --   < > = values in this interval not displayed. Estimated Creatinine Clearance: 29.4 mL/min (by C-G formula based on Cr of 1.18).  Medical History: Past Medical History  Diagnosis Date  . Hyperlipidemia   . Coronary artery disease   . Venous insufficiency   . CHF (congestive heart failure)     acute diastolic heart failure  . Arrhythmia     chronic afib  . GERD (gastroesophageal reflux disease)   . Mitral valve prolapse   . Shoulder pain, right   . Swelling     both legs and feet  . Shortness of breath dyspnea   . Headache     occasional headache  . Arthritis   . Sleeping difficulties   . Atrial fibrillation   . Bladder cancer    Medications:  Prescriptions prior to admission  Medication Sig Dispense Refill Last Dose  . calcium carbonate (OS-CAL) 600 MG TABS Take 600 mg by mouth daily at 12 noon.    08/19/2014 at Unknown time  . diltiazem (CARDIZEM CD) 120 MG 24  hr capsule TAKE (1) CAPSULE DAILY (Patient taking differently: TAKE (1) CAPSULE DAILY IN THE MORNING.) 30 capsule 6 08/19/2014 at Unknown time  . docusate sodium 100 MG CAPS Take 100 mg by mouth 2 (two) times daily. 30 capsule 0 08/19/2014 at Unknown time  . furosemide (LASIX) 20 MG tablet Take 1 tablet (20 mg total) by mouth daily. 30 tablet 11 08/19/2014 at Unknown time  . meclizine (ANTIVERT) 12.5 MG tablet Take 12.5 mg by mouth 3 (three) times daily as needed for dizziness.    Past Week at Unknown time  . metoprolol (LOPRESSOR) 50 MG tablet Take 50 mg by mouth every morning.  4 08/19/2014 at Unknown time  . Multiple Vitamins-Minerals (MULTIVITAMINS THER. W/MINERALS) TABS Take 1 tablet by mouth daily at 12 noon.    08/19/2014 at Unknown time  . rosuvastatin (CRESTOR) 20 MG tablet Take 20 mg by mouth daily after lunch.    08/19/2014 at Unknown time  .  acetaminophen (TYLENOL) 500 MG tablet Take 500 mg by mouth every 6 (six) hours as needed for pain.   unknown   Assessment: 81yo female with h/o chronic Afib.  She has been on Coumadin in the past, however it was stopped for bladder surgery on 07/20/14 and held post-op for bleeding.  Pt was recently discharged on Coumadin and readmitted for worsening SOB.  Discharge Instructions (from previous admit) She is to go back on warfarin daily 2.5 mg daily.. She is to make appointment with primary care physician on Tuesday for repeat prothrombin time and evaluation No bleeding noted.  CBC stable.  INR is at goal today.    Goal of Therapy:  INR 2-3   Plan:  Coumadin 2.5mg  po today x 1 INR daily  Tammie Ellsworth A 08/21/2014,9:56 AM

## 2014-08-21 NOTE — Consult Note (Signed)
Reason for Consult:   CHF  Requesting Physician: Dr Everette Rank  HPI: This is a 81 y.o. female with a past medical history significant for CAF on Coumadin, HTN, mild-moderate AS, and mild CAD in '03. She was recently admitted  08/15/14-08/19/14 with acute diastolic CHF. Echo 08/15/14 showed an EF of 55-60% with mild-moderate AS. She improved with diuresis and she was sent home on her previous dose of Lasix 20 mg daily. Unfortunately her wgts in the hospital were unreliable but her I/O was negative. She was fine at discharge but developed recurrent dyspnea and had to come back early in the morning 08/20/14. We are asked to re-consult.   PMHx:  Past Medical History  Diagnosis Date  . Hyperlipidemia   . Coronary artery disease   . Venous insufficiency   . CHF (congestive heart failure)     acute diastolic heart failure  . Arrhythmia     chronic afib  . GERD (gastroesophageal reflux disease)   . Mitral valve prolapse   . Shoulder pain, right   . Swelling     both legs and feet  . Shortness of breath dyspnea   . Headache     occasional headache  . Arthritis   . Sleeping difficulties   . Atrial fibrillation   . Bladder cancer     Past Surgical History  Procedure Laterality Date  . Colonoscopy N/A 09/30/2012    Procedure: COLONOSCOPY;  Surgeon: Rogene Houston, MD;  Location: [facility];  Service: Endoscopy;  Laterality: N/A;  930  . Cardioversion      x2  . Back surgery      x2-lower back  . Transurethral resection of bladder tumor with gyrus (turbt-gyrus) N/A 07/19/2014    Procedure: TRANSURETHRAL RESECTION OF BLADDER TUMOR WITH GYRUS (TURBT-GYRUS) WITH CYSTOGRAM;  Surgeon: Alexis Frock, MD;  Location: [facility];  Service: Urology;  Laterality: N/A;  . Transurethral resection of bladder tumor with gyrus (turbt-gyrus) N/A 07/20/2014    Procedure: SECOND LOOK TRANSURETHRAL RESECTION OF BLADDER TUMOR WITH GYRUS (TURBT-GYRUS);  Surgeon: Alexis Frock, MD;  Location: [facility];  Service: Urology;  Laterality: N/A;  . Cataract extraction      SOCHx:  reports that she has never smoked. She has never used smokeless tobacco. She reports that she does not drink alcohol or use illicit drugs.  FAMHx: Family History  Problem Relation Age of Onset  . Colon cancer Neg Hx   . Colon polyps Sister     ALLERGIES: Allergies  Allergen Reactions  . Amoxicillin Other (See Comments)    Unknown  . Penicillins Other (See Comments)    Unknown  . Sulfonamide Derivatives Other (See Comments)    Unknown    ROS: Pertinent items are noted in HPI. See H&P for complete details, no chest pain at this time  HOME MEDICATIONS: Prior to Admission medications   Medication Sig Start Date End Date Taking? Authorizing Provider  calcium carbonate (OS-CAL) 600 MG TABS Take 600 mg by mouth daily at 12 noon.    Yes Historical Provider, MD  diltiazem (CARDIZEM CD) 120 MG 24 hr capsule TAKE (1) CAPSULE DAILY Patient taking differently: TAKE (1) CAPSULE DAILY IN THE MORNING. 05/15/14  Yes Burnell Blanks, MD  docusate sodium 100 MG CAPS Take 100 mg by mouth 2 (two) times daily. 07/22/14  Yes Jorja Loa, MD  furosemide (LASIX) 20 MG tablet Take 1 tablet (20 mg total) by mouth daily.  05/26/14  Yes Burnell Blanks, MD  meclizine (ANTIVERT) 12.5 MG tablet Take 12.5 mg by mouth 3 (three) times daily as needed for dizziness.    Yes Historical Provider, MD  metoprolol (LOPRESSOR) 50 MG tablet Take 50 mg by mouth every morning. 06/23/14  Yes Historical Provider, MD  Multiple Vitamins-Minerals (MULTIVITAMINS THER. W/MINERALS) TABS Take 1 tablet by mouth daily at 12 noon.    Yes Historical Provider, MD  rosuvastatin (CRESTOR) 20 MG tablet Take 20 mg by mouth daily after lunch.    Yes Historical Provider, MD  acetaminophen (TYLENOL) 500 MG tablet Take 500 mg by mouth every 6 (six) hours as needed for pain.    Historical Provider, MD    HOSPITAL MEDICATIONS: I have reviewed the  patient's current medications.  VITALS: Blood pressure 133/62, pulse 51, temperature 98.1 F (36.7 C), temperature source Oral, resp. rate 20, height 5\' 3"  (1.6 m), weight 135 lb 6.4 oz (61.417 kg), SpO2 99 %.  PHYSICAL EXAM: General appearance: alert, cooperative, appears stated age and no distress Neck: no carotid bruit and no JVD Lungs: kyphoscoliosis, decreased breath sounds at the bases, Rt > Lt Heart: regular rate and irregular rhythm and soft systolioc murmur Abdomen: soft, non tender Extremities: trace bilateral lower extremity edema Pulses: diminnished Skin: pale, cool, dry Neurologic: Grossly normal  LABS: Results for orders placed or performed during the hospital encounter of 08/20/14 (from the past 24 hour(s))  Troponin I (q 6hr x 3)     Status: None   Collection Time: 08/20/14  1:09 PM  Result Value Ref Range   Troponin I <0.03 <0.031 ng/mL  Troponin I (q 6hr x 3)     Status: None   Collection Time: 08/20/14  7:15 PM  Result Value Ref Range   Troponin I <0.03 <0.031 ng/mL  CBC     Status: Abnormal   Collection Time: 08/21/14  5:58 AM  Result Value Ref Range   WBC 5.9 4.0 - 10.5 K/uL   RBC 3.15 (L) 3.87 - 5.11 MIL/uL   Hemoglobin 9.6 (L) 12.0 - 15.0 g/dL   HCT 30.7 (L) 36.0 - 46.0 %   MCV 97.5 78.0 - 100.0 fL   MCH 30.5 26.0 - 34.0 pg   MCHC 31.3 30.0 - 36.0 g/dL   RDW 15.3 11.5 - 15.5 %   Platelets 230 150 - 400 K/uL  Comprehensive metabolic panel     Status: Abnormal   Collection Time: 08/21/14  5:58 AM  Result Value Ref Range   Sodium 141 135 - 145 mmol/L   Potassium 3.8 3.5 - 5.1 mmol/L   Chloride 101 96 - 112 mmol/L   CO2 32 19 - 32 mmol/L   Glucose, Bld 101 (H) 70 - 99 mg/dL   BUN 29 (H) 6 - 23 mg/dL   Creatinine, Ser 1.18 (H) 0.50 - 1.10 mg/dL   Calcium 8.6 8.4 - 10.5 mg/dL   Total Protein 5.2 (L) 6.0 - 8.3 g/dL   Albumin 2.7 (L) 3.5 - 5.2 g/dL   AST 19 0 - 37 U/L   ALT 13 0 - 35 U/L   Alkaline Phosphatase 72 39 - 117 U/L   Total Bilirubin 0.6  0.3 - 1.2 mg/dL   GFR calc non Af Amer 41 (L) >90 mL/min   GFR calc Af Amer 48 (L) >90 mL/min   Anion gap 8 5 - 15  Protime-INR     Status: Abnormal   Collection Time: 08/21/14  5:58 AM  Result Value Ref Range   Prothrombin Time 25.6 (H) 11.6 - 15.2 seconds   INR 2.32 (H) 0.00 - 1.49    EKG: AF with CVR, occasional PVC  IMAGING: Dg Chest Port 1 View  08/20/2014   CLINICAL DATA:  Shortness of breath, chest pressure, and discomfort. Recent discharge from hospital yesterday.  EXAM: PORTABLE CHEST - 1 VIEW  COMPARISON:  08/15/2014  FINDINGS: Cardiac enlargement with pulmonary vascular congestion. Increasing perihilar edema since previous study. Increasing bilateral pleural effusions and basilar atelectasis since previous study. No pneumothorax. Calcification of aorta.  IMPRESSION: Worsening changes of congestive failure since previous study. Increased perihilar edema and increasing bilateral pleural effusions and basilar atelectasis.   Electronically Signed   By: Lucienne Capers M.D.   On: 08/20/2014 03:14    IMPRESSION: Active Problems:   Acute diastolic heart failure-(recurrent)   Essential hypertension   CAD- 40% LAD in 2003   ATRIAL FIBRILLATION, CHRONIC   Chronic renal insufficiency, stage III (moderate)   Dyslipidemia   Bladder cancer- surgery 07/19/14   Aortic stenosis-mild to moderate   Chronic anticoagulation   RECOMMENDATION: MD to see. She will probably require Lasix 40 mg daily after discharge. From her history it sounds like she had been on this dose in the past but it cut back to 20 mg daily. She'll need close follow up with labs as an OP after discharge.   Time Spent Directly with Patient:  40 minutes  Erlene Quan [phone] beeper 08/21/2014, 12:21 PM   The patient was seen and examined, and I agree with the assessment and plan as documented above, with modifications as noted below. Pt recently hospitalized for diastolic heart failure and discharged on 1/30, then  readmitted with worsening shortness of breath and found to have acute on chronic diastolic heart failure. ECG on admission (1/31) demonstrates controlled atrial fibrillation with PVC's. When Dr. Harl Bowie evaluated her on 1/30, he had recommended Lasix 20 mg bid but it appears she was only discharged on 20 mg daily. I think she will need a total of 40 mg Lasix upon discharge with recommendations to take an extra 20 mg for worsening shortness of breath. She is feeling much better today as compared to yesterday. She has had nearly 1.4 L output in last 24 hours. Agree with close outpatient follow up in our clinic.

## 2014-08-22 DIAGNOSIS — I5031 Acute diastolic (congestive) heart failure: Secondary | ICD-10-CM

## 2014-08-22 LAB — PROTIME-INR
INR: 2.8 — ABNORMAL HIGH (ref 0.00–1.49)
Prothrombin Time: 29.7 seconds — ABNORMAL HIGH (ref 11.6–15.2)

## 2014-08-22 MED ORDER — WARFARIN SODIUM 5 MG PO TABS: 2.5000 mg | ORAL_TABLET | Freq: Once | ORAL | Status: AC

## 2014-08-22 MED ORDER — FUROSEMIDE 40 MG PO TABS
40.0000 mg | ORAL_TABLET | Freq: Every day | ORAL | Status: DC
Start: 2014-08-22 — End: 2014-08-23
  Administered 2014-08-22 – 2014-08-23 (×2): 40 mg via ORAL
  Filled 2014-08-22 (×2): qty 1

## 2014-08-22 MED ORDER — POLYETHYLENE GLYCOL 3350 17 G PO PACK: 17.0000 g | PACK | Freq: Every day | ORAL | Status: DC | PRN

## 2014-08-22 MED ORDER — WARFARIN SODIUM 5 MG PO TABS
2.5000 mg | ORAL_TABLET | Freq: Once | ORAL | Status: AC
  Administered 2014-08-22: 2.5 mg via ORAL
  Filled 2014-08-22: qty 1

## 2014-08-22 NOTE — Progress Notes (Signed)
Patient complaining of constipation. Dr. Legrand Rams paged. Order for miralax recieved.

## 2014-08-22 NOTE — Progress Notes (Signed)
Subjective: The patient is more comfortable. She has less dyspnea and less edema. She does have diastolic congestive heart failure. Her furosemide dosage has been adjusted.  Objective: Vital signs in last 24 hours: Temp:  [98.1 F (36.7 C)-98.2 F (36.8 C)] 98.2 F (36.8 C) (02/01 2224) Pulse Rate:  [40-85] 85 (02/01 2224) Resp:  [20] 20 (02/01 2224) BP: (113-133)/(50-81) 113/81 mmHg (02/01 2224) SpO2:  [97 %-99 %] 97 % (02/01 2224) Weight:  [61.417 kg (135 lb 6.4 oz)] 61.417 kg (135 lb 6.4 oz) (02/01 8242) Weight change:  Last BM Date: 08/21/14  Intake/Output from previous day: 02/01 0701 - 02/02 0700 In: 483 [P.O.:480; I.V.:3] Out: 850 [Urine:850] Intake/Output this shift: Total I/O In: -  Out: 400 [Urine:400]  Physical Exam: General appearance the patient was oriented alert in no distress  H EENT negative  Neck supple no JVD or thyroid abnormalities  Heart irregular rhythm  Abdomen the palpable organs or masses  Extremities mild edema bilaterally   Recent Labs  08/20/14 0229 08/21/14 0558  WBC 5.5 5.9  HGB 9.9* 9.6*  HCT 31.8* 30.7*  PLT 218 230   BMET  Recent Labs  08/20/14 0229 08/21/14 0558  NA 140 141  K 4.1 3.8  CL 103 101  CO2 32 32  GLUCOSE 115* 101*  BUN 33* 29*  CREATININE 1.32* 1.18*  CALCIUM 8.8 8.6    Studies/Results: No results found.  Medications:  . diltiazem  120 mg Oral Daily  . docusate sodium  100 mg Oral BID  . furosemide  20 mg Intravenous BID  . metoprolol  50 mg Oral [id number]  . rosuvastatin  20 mg Oral QPC lunch  . sodium chloride  3 mL Intravenous Q12H  . sodium chloride  3 mL Intravenous Q12H  . Warfarin - Pharmacist Dosing Inpatient   Does not apply Q24H        Assessment/Plan: 1. Diastolic congestive heart failure with pulmonary edema improved-continue monitor chemistries  2. Chronic atrial fibrillation with controlled rate to continue Coumadin daily diltiazem and metoprolol  3. Anemia which is improved  continue to monitor   LOS: 2 days   Gena Laski G 08/22/2014, 5:58 AM

## 2014-08-22 NOTE — Progress Notes (Signed)
SUBJECTIVE: She feels "about the same as yesterday", but overall feels breathing has markedly improved. Denies chest pain. Says leg swelling has gone down as well.     Intake/Output Summary (Last 24 hours) at 08/22/14 1010 Last data filed at 08/22/14 8466  Gross per 24 hour  Intake    243 ml  Output   1350 ml  Net  -1107 ml    Current Facility-Administered Medications  Medication Dose Route Frequency Provider Last Rate Last Dose  . 0.9 %  sodium chloride infusion  250 mL Intravenous PRN Jani Gravel, MD      . acetaminophen (TYLENOL) tablet 650 mg  650 mg Oral Q6H PRN Jani Gravel, MD       Or  . acetaminophen (TYLENOL) suppository 650 mg  650 mg Rectal Q6H PRN Jani Gravel, MD      . diltiazem (CARDIZEM CD) 24 hr capsule 120 mg  120 mg Oral Daily Jani Gravel, MD   120 mg at 08/22/14 0904  . docusate sodium (COLACE) capsule 100 mg  100 mg Oral BID Jani Gravel, MD   100 mg at 08/22/14 5993  . furosemide (LASIX) tablet 40 mg  40 mg Oral Daily Herminio Commons, MD      . meclizine (ANTIVERT) tablet 12.5 mg  12.5 mg Oral TID PRN Jani Gravel, MD      . metoprolol (LOPRESSOR) tablet 50 mg  50 mg Oral Zara Chess, MD   50 mg at 08/22/14 5701  . nitroGLYCERIN (NITROSTAT) SL tablet 0.4 mg  0.4 mg Sublingual Q5 min PRN Delora Fuel, MD   0.4 mg at 08/20/14 0341  . rosuvastatin (CRESTOR) tablet 20 mg  20 mg Oral QPC lunch Jani Gravel, MD   20 mg at 08/21/14 1218  . sodium chloride 0.9 % injection 3 mL  3 mL Intravenous Q12H Jani Gravel, MD   3 mL at 08/21/14 2233  . sodium chloride 0.9 % injection 3 mL  3 mL Intravenous Q12H Jani Gravel, MD   3 mL at 08/22/14 0905  . sodium chloride 0.9 % injection 3 mL  3 mL Intravenous PRN Jani Gravel, MD      . Warfarin - Pharmacist Dosing Inpatient   Does not apply Q24H [name], MD        Filed Vitals:   08/21/14 1441 08/21/14 2224 08/22/14 0611 08/22/14 0905  BP: 125/50 113/81 130/70 130/67  Pulse: 40 85 33 75  Temp: 98.2 F (36.8 C) 98.2 F (36.8  C) 97.7 F (36.5 C)   TempSrc: Oral Oral Oral   Resp: 20 20 20    Height:      Weight:   141 lb 3.2 oz (64.048 kg)   SpO2: 98% 97% 97%     PHYSICAL EXAM General: NAD HEENT: Normal. Neck: No JVD, no thyromegaly.  Lungs: Clear to auscultation bilaterally with normal respiratory effort. CV: Regular rate, irregular rhythm, normal S1/S2, no S3, 2/6 systolic murmur over RUSB.  Trace pretibial edema with stasis dermatitis.   Abdomen: Soft, nontender, no distention.  Neurologic: Alert and oriented x 3.  Psych: Normal affect. Musculoskeletal: No gross deformities. Extremities: No clubbing or cyanosis.   TELEMETRY: Reviewed telemetry pt in atrial fibrillation.  LABS: Basic Metabolic Panel:  Recent Labs  08/20/14 0229 08/21/14 0558  NA 140 141  K 4.1 3.8  CL 103 101  CO2 32 32  GLUCOSE 115* 101*  BUN 33* 29*  CREATININE 1.32* 1.18*  CALCIUM 8.8 8.6   Liver Function Tests:  Recent Labs  08/21/14 0558  AST 19  ALT 13  ALKPHOS 72  BILITOT 0.6  PROT 5.2*  ALBUMIN 2.7*   No results for input(s): LIPASE, AMYLASE in the last 72 hours. CBC:  Recent Labs  08/20/14 0229 08/21/14 0558  WBC 5.5 5.9  NEUTROABS 3.5  --   HGB 9.9* 9.6*  HCT 31.8* 30.7*  MCV 97.0 97.5  PLT 218 230   Cardiac Enzymes:  Recent Labs  08/20/14 0734 08/20/14 1309 08/20/14 1915  TROPONINI <0.03 <0.03 <0.03   BNP: Invalid input(s): POCBNP D-Dimer: No results for input(s): DDIMER in the last 72 hours. Hemoglobin A1C: No results for input(s): HGBA1C in the last 72 hours. Fasting Lipid Panel: No results for input(s): CHOL, HDL, LDLCALC, TRIG, CHOLHDL, LDLDIRECT in the last 72 hours. Thyroid Function Tests:  Recent Labs  08/20/14 0228  TSH 4.947*   Anemia Panel: No results for input(s): VITAMINB12, FOLATE, FERRITIN, TIBC, IRON, RETICCTPCT in the last 72 hours.  RADIOLOGY: Dg Chest Port 1 View  08/20/2014   CLINICAL DATA:  Shortness of breath, chest pressure, and discomfort.  Recent discharge from hospital yesterday.  EXAM: PORTABLE CHEST - 1 VIEW  COMPARISON:  08/15/2014  FINDINGS: Cardiac enlargement with pulmonary vascular congestion. Increasing perihilar edema since previous study. Increasing bilateral pleural effusions and basilar atelectasis since previous study. No pneumothorax. Calcification of aorta.  IMPRESSION: Worsening changes of congestive failure since previous study. Increased perihilar edema and increasing bilateral pleural effusions and basilar atelectasis.   Electronically Signed   By: Lucienne Capers M.D.   On: 08/20/2014 03:14   Dg Chest Port 1 View  08/15/2014   CLINICAL DATA:  Shortness of breath and weakness.  EXAM: PORTABLE CHEST - 1 VIEW  COMPARISON:  07/18/2014  FINDINGS: Cardiac enlargement without vascular congestion. Small bilateral pleural effusions with basilar atelectasis. No pneumothorax. No focal consolidation. Calcification of the aorta.  IMPRESSION: Cardiac enlargement. Small bilateral pleural effusions with basilar atelectasis.   Electronically Signed   By: Lucienne Capers M.D.   On: 08/15/2014 04:09      ASSESSMENT AND PLAN: 1. Acute on chronic diastolic heart failure: Has had adequate diuretic response with nearly 900 cc out in last 24+ hours. Will switch to oral furosemide 40 mg daily. She can take an extra 20 mg prn for increasing shortness of breath and/or leg swelling. 2. Essential HTN: Controlled on present therapy. No changes. 3. CAD: Symptomatically stable. No changes. 4. Aortic stenosis: Mild to moderate as assessed on 08/15/14. 5. Chronic atrial fibrillation: Rate is controlled on long-acting diltiazem and metoprolol. Anticoagulated with warfarin. 6. Hyperlipidemia: Controlled with Crestor.  Dispo: I think she can safely be discharged on the morning of 08/23/14 barring anything unforeseen. No further recommendations. Will arrange for outpatient follow up.  Kate Sable, M.D., F.A.C.C.

## 2014-08-22 NOTE — Progress Notes (Signed)
[place] for Coumadin Indication: atrial fibrillation  Allergies  Allergen Reactions  . Amoxicillin Other (See Comments)    Unknown  . Penicillins Other (See Comments)    Unknown  . Sulfonamide Derivatives Other (See Comments)    Unknown   Patient Measurements: Height: 5\' 3"  (160 cm) Weight: 141 lb 3.2 oz (64.048 kg) IBW/kg (Calculated) : 52.4  Vital Signs: Temp: 97.7 F (36.5 C) (02/02 0611) Temp Source: Oral (02/02 0611) BP: 130/67 mmHg (02/02 0905) Pulse Rate: 75 (02/02 0905)  Labs:  Recent Labs  08/20/14 0228  08/20/14 0229 08/20/14 0734 08/20/14 1309 08/20/14 1915 08/21/14 0558 08/22/14 0544  HGB  --   --  9.9*  --   --   --  9.6*  --   HCT  --   --  31.8*  --   --   --  30.7*  --   PLT  --   --  218  --   --   --  230  --   LABPROT 22.5*  --   --   --   --   --  25.6* 29.7*  INR 1.96*  --   --   --   --   --  2.32* 2.80*  CREATININE  --   --  1.32*  --   --   --  1.18*  --   TROPONINI  --   < > <0.03 <0.03 <0.03 <0.03  --   --   < > = values in this interval not displayed. Estimated Creatinine Clearance: 31.9 mL/min (by C-G formula based on Cr of 1.18).  Medical History: Past Medical History  Diagnosis Date  . Hyperlipidemia   . Coronary artery disease   . Venous insufficiency   . CHF (congestive heart failure)     acute diastolic heart failure  . Arrhythmia     chronic afib  . GERD (gastroesophageal reflux disease)   . Mitral valve prolapse   . Shoulder pain, right   . Swelling     both legs and feet  . Shortness of breath dyspnea   . Headache     occasional headache  . Arthritis   . Sleeping difficulties   . Atrial fibrillation   . Bladder cancer    Medications:  Prescriptions prior to admission  Medication Sig Dispense Refill Last Dose  . calcium carbonate (OS-CAL) 600 MG TABS Take 600 mg by mouth daily at 12 noon.    08/19/2014 at Unknown time  . diltiazem (CARDIZEM CD) 120 MG 24 hr capsule TAKE  (1) CAPSULE DAILY (Patient taking differently: TAKE (1) CAPSULE DAILY IN THE MORNING.) 30 capsule 6 08/19/2014 at Unknown time  . docusate sodium 100 MG CAPS Take 100 mg by mouth 2 (two) times daily. 30 capsule 0 08/19/2014 at Unknown time  . furosemide (LASIX) 20 MG tablet Take 1 tablet (20 mg total) by mouth daily. 30 tablet 11 08/19/2014 at Unknown time  . meclizine (ANTIVERT) 12.5 MG tablet Take 12.5 mg by mouth 3 (three) times daily as needed for dizziness.    Past Week at Unknown time  . metoprolol (LOPRESSOR) 50 MG tablet Take 50 mg by mouth every morning.  4 08/19/2014 at Unknown time  . Multiple Vitamins-Minerals (MULTIVITAMINS THER. W/MINERALS) TABS Take 1 tablet by mouth daily at 12 noon.    08/19/2014 at Unknown time  . rosuvastatin (CRESTOR) 20 MG tablet Take 20 mg by mouth daily after lunch.  08/19/2014 at Unknown time  . acetaminophen (TYLENOL) 500 MG tablet Take 500 mg by mouth every 6 (six) hours as needed for pain.   unknown   Assessment: 81yo female with h/o chronic Afib.  She has been on Coumadin in the past, however it was stopped for bladder surgery on 07/20/14 and held post-op for bleeding.  Pt was discharged on Coumadin 2.5 mg daily. No bleeding noted.   INR is at goal today, however trending to upper goal range likely to 5mg  dose given on 1/31.    Goal of Therapy:  INR 2-3   Plan:  Coumadin 2.5mg  po today x 1 INR daily Recommend discharge back home on 2.5mg  po daily  Biagio Borg 08/22/2014,11:27 AM

## 2014-08-23 ENCOUNTER — Other Ambulatory Visit: Payer: Self-pay | Admitting: Cardiovascular Disease

## 2014-08-23 LAB — PROTIME-INR
INR: 2.73 — ABNORMAL HIGH (ref 0.00–1.49)
PROTHROMBIN TIME: 29.1 s — AB (ref 11.6–15.2)

## 2014-08-23 MED ORDER — NITROGLYCERIN 0.4 MG SL SUBL: 0.4000 mg | SUBLINGUAL_TABLET | SUBLINGUAL | Status: DC | PRN

## 2014-08-23 MED ORDER — FUROSEMIDE 40 MG PO TABS: 40.0000 mg | ORAL_TABLET | Freq: Every day | ORAL | Status: DC

## 2014-08-23 NOTE — Discharge Summary (Signed)
Physician Discharge Summary  Marjoria Mancillas Kiddy DJS:[id number] DOB: April 20, 1928 DOA: 08/20/2014  PCP: [name], MD  Admit date: 08/20/2014 Discharge date: 08/23/2014     Discharge Diagnoses:  1. Diastolic congestive heart failure 2. Atrial fibrillation 3. Anemia of chronic disease 4. Bladder cancer recent treatment Discharge Condition: Stable Disposition: Home  Diet recommendation: Heart healthy low sodium 2 g  Filed Weights   08/20/14 0708 08/21/14 0613 08/22/14 0611  Weight: 62.551 kg (137 lb 14.4 oz) 61.417 kg (135 lb 6.4 oz) 64.048 kg (141 lb 3.2 oz)    History of present illness:  The patient has coronary artery disease congestive heart failure with ejection fraction 60%. She was readmitted to the hospital because of increased shortness of breath. She presented to the ED in this fashion x-ray of chest showed mild digestive heart failure troponin was negative  Hospital Course:  The patient was admitted to [place] bed on telemetry. Examination on admission patient was noted to be in mild respiratory distress. Lungs showed rales bilaterally. She was given IV Lasix on admission and continued on medications listed below. She rapidly improved. She was seen in consultation by cardiology. It was noted that she had had a: 08/15/2014 showing EF of 55-60%. Recommend she have increased dosage of Lasix and we'll start on 40 mg daily. She did diurese adequately on this and began to feel much better. It was felt she could be discharged and followed as an outpatient.   Discharge Instructions The patient is to continue medications listed below and was asked to return to primary care care physician's office in 2 days for follow-up visit    Medication List    TAKE these medications        acetaminophen 500 MG tablet  Commonly known as:  TYLENOL  Take 500 mg by mouth every 6 (six) hours as needed for pain.     calcium carbonate 600 MG Tabs tablet  Commonly known as:  OS-CAL  Take 600  mg by mouth daily at 12 noon.     diltiazem 120 MG 24 hr capsule  Commonly known as:  CARDIZEM CD  TAKE (1) CAPSULE DAILY     DSS 100 MG Caps  Take 100 mg by mouth 2 (two) times daily.     furosemide 40 MG tablet  Commonly known as:  LASIX  Take 1 tablet (40 mg total) by mouth daily.     meclizine 12.5 MG tablet  Commonly known as:  ANTIVERT  Take 12.5 mg by mouth 3 (three) times daily as needed for dizziness.     metoprolol 50 MG tablet  Commonly known as:  LOPRESSOR  Take 50 mg by mouth every morning.     multivitamins ther. w/minerals Tabs tablet  Take 1 tablet by mouth daily at 12 noon.     nitroGLYCERIN 0.4 MG SL tablet  Commonly known as:  NITROSTAT  Place 1 tablet (0.4 mg total) under the tongue every 5 (five) minutes as needed for chest pain (CP or SOB).     rosuvastatin 20 MG tablet  Commonly known as:  CRESTOR  Take 20 mg by mouth daily after lunch.       Allergies  Allergen Reactions  . Amoxicillin Other (See Comments)    Unknown  . Penicillins Other (See Comments)    Unknown  . Sulfonamide Derivatives Other (See Comments)    Unknown    The results of significant diagnostics from this hospitalization (including imaging, microbiology, ancillary and laboratory) are listed  below for reference.    Significant Diagnostic Studies: Dg Chest Port 1 View  08/20/2014   CLINICAL DATA:  Shortness of breath, chest pressure, and discomfort. Recent discharge from hospital yesterday.  EXAM: PORTABLE CHEST - 1 VIEW  COMPARISON:  08/15/2014  FINDINGS: Cardiac enlargement with pulmonary vascular congestion. Increasing perihilar edema since previous study. Increasing bilateral pleural effusions and basilar atelectasis since previous study. No pneumothorax. Calcification of aorta.  IMPRESSION: Worsening changes of congestive failure since previous study. Increased perihilar edema and increasing bilateral pleural effusions and basilar atelectasis.   Electronically Signed   By:  Lucienne Capers M.D.   On: 08/20/2014 03:14   Dg Chest Port 1 View  08/15/2014   CLINICAL DATA:  Shortness of breath and weakness.  EXAM: PORTABLE CHEST - 1 VIEW  COMPARISON:  07/18/2014  FINDINGS: Cardiac enlargement without vascular congestion. Small bilateral pleural effusions with basilar atelectasis. No pneumothorax. No focal consolidation. Calcification of the aorta.  IMPRESSION: Cardiac enlargement. Small bilateral pleural effusions with basilar atelectasis.   Electronically Signed   By: Lucienne Capers M.D.   On: 08/15/2014 04:09    Microbiology: Recent Results (from the past 240 hour(s))  MRSA PCR Screening     Status: None   Collection Time: 08/15/14  8:30 AM  Result Value Ref Range Status   MRSA by PCR NEGATIVE NEGATIVE Final    Comment:        The GeneXpert MRSA Assay (FDA approved for NASAL specimens only), is one component of a comprehensive MRSA colonization surveillance program. It is not intended to diagnose MRSA infection nor to guide or monitor treatment for MRSA infections.      Labs: Basic Metabolic Panel:  Recent Labs Lab 08/17/14 0439 08/18/14 0445 08/19/14 0622 08/20/14 0229 08/21/14 0558  NA 141 140 141 140 141  K 3.8 3.7 3.4* 4.1 3.8  CL 107 106 101 103 101  CO2 31 33* 32 32 32  GLUCOSE 98 101* 102* 115* 101*  BUN 27* 30* 31* 33* 29*  CREATININE 1.22* 1.39* 1.30* 1.32* 1.18*  CALCIUM 8.5 8.3* 8.7 8.8 8.6  MG 2.1  --   --   --   --    Liver Function Tests:  Recent Labs Lab 08/21/14 0558  AST 19  ALT 13  ALKPHOS 72  BILITOT 0.6  PROT 5.2*  ALBUMIN 2.7*   No results for input(s): LIPASE, AMYLASE in the last 168 hours. No results for input(s): AMMONIA in the last 168 hours. CBC:  Recent Labs Lab 08/17/14 0439 08/18/14 0445 08/19/14 0622 08/20/14 0229 08/21/14 0558  WBC 4.4 5.3 5.5 5.5 5.9  NEUTROABS  --   --   --  3.5  --   HGB 8.9* 9.0* 9.1* 9.9* 9.6*  HCT 28.7* 28.5* 28.8* 31.8* 30.7*  MCV 100.0 97.9 98.0 97.0 97.5   PLT 194 204 201 218 230   Cardiac Enzymes:  Recent Labs Lab 08/16/14 2138 08/20/14 0229 08/20/14 0734 08/20/14 1309 08/20/14 1915  TROPONINI <0.03 <0.03 <0.03 <0.03 <0.03   BNP: BNP (last 3 results)  Recent Labs  08/15/14 0342 08/20/14 0228  BNP 719.0* 466.0*    ProBNP (last 3 results) No results for input(s): PROBNP in the last 8760 hours.  CBG: No results for input(s): GLUCAP in the last 168 hours.  Active Problems:   Dyslipidemia   Essential hypertension   CAD- 40% LAD in 2003   ATRIAL FIBRILLATION, CHRONIC   Acute diastolic heart failure-(recurrent)   Bladder cancer- surgery  07/19/14   Chronic renal insufficiency, stage III (moderate)   Aortic stenosis-mild to moderate   Chronic anticoagulation   Time coordinating discharge: 30 minutes Signed:  Marjean Donna, MD 08/23/2014, 6:15 AM

## 2014-08-23 NOTE — Progress Notes (Signed)
NURSING PROGRESS NOTE  WILL SCHIER [id number] Discharge Data: 08/23/2014 4:25 PM Attending Provider: No att. providers found TRZ:NBVAPOL,IDCVU Darnell Level, MD   Suzie Portela Pinela to be D/C'd Home per MD order.    All IV's discontinued and monitored for bleeding.  All belongings returned to patient for patient to take home.  AVS summary and prescriptions reviewed with patient.  Patient left floor via wheelchair, escorted by NT.  Last Documented Vital Signs:  Blood pressure 123/89, pulse 71, temperature 97.3 F (36.3 C), temperature source Oral, resp. rate 20, height 5\' 3"  (1.6 m), weight 62.959 kg (138 lb 12.8 oz), SpO2 90 %.  Cecilie Kicks D

## 2014-08-29 ENCOUNTER — Ambulatory Visit (INDEPENDENT_AMBULATORY_CARE_PROVIDER_SITE_OTHER): Payer: Medicare Other | Admitting: Cardiology

## 2014-08-29 ENCOUNTER — Telehealth: Payer: Self-pay | Admitting: Cardiovascular Disease

## 2014-08-29 DIAGNOSIS — Z5181 Encounter for therapeutic drug level monitoring: Secondary | ICD-10-CM

## 2014-08-29 DIAGNOSIS — I4891 Unspecified atrial fibrillation: Secondary | ICD-10-CM

## 2014-08-29 LAB — POCT INR: INR: 2.4

## 2014-08-29 NOTE — Telephone Encounter (Signed)
New Msg        Mandy from [place] calling to report pt PT INR.  PT 26.7   INR 2.4.    Leafy Ro may be reached at [phone].

## 2014-08-29 NOTE — Telephone Encounter (Signed)
Returned call to [facility] RN, see anticoagulation note in Epic.

## 2014-09-12 ENCOUNTER — Ambulatory Visit (INDEPENDENT_AMBULATORY_CARE_PROVIDER_SITE_OTHER): Payer: Medicare Other | Admitting: Cardiology

## 2014-09-12 DIAGNOSIS — I4891 Unspecified atrial fibrillation: Secondary | ICD-10-CM

## 2014-09-12 DIAGNOSIS — Z5181 Encounter for therapeutic drug level monitoring: Secondary | ICD-10-CM

## 2014-09-12 LAB — POCT INR: INR: 1.8

## 2014-09-26 ENCOUNTER — Ambulatory Visit (INDEPENDENT_AMBULATORY_CARE_PROVIDER_SITE_OTHER): Payer: Medicare Other | Admitting: Pharmacist

## 2014-09-26 DIAGNOSIS — I4891 Unspecified atrial fibrillation: Secondary | ICD-10-CM

## 2014-09-26 DIAGNOSIS — Z5181 Encounter for therapeutic drug level monitoring: Secondary | ICD-10-CM

## 2014-09-26 LAB — POCT INR: INR: 1.7

## 2014-10-10 ENCOUNTER — Ambulatory Visit (INDEPENDENT_AMBULATORY_CARE_PROVIDER_SITE_OTHER): Payer: Medicare Other | Admitting: Pharmacist

## 2014-10-10 DIAGNOSIS — Z5181 Encounter for therapeutic drug level monitoring: Secondary | ICD-10-CM

## 2014-10-10 DIAGNOSIS — I4891 Unspecified atrial fibrillation: Secondary | ICD-10-CM

## 2014-10-10 LAB — POCT INR: INR: 3.3

## 2014-10-27 ENCOUNTER — Ambulatory Visit (INDEPENDENT_AMBULATORY_CARE_PROVIDER_SITE_OTHER): Payer: Medicare Other | Admitting: *Deleted

## 2014-10-27 DIAGNOSIS — Z7901 Long term (current) use of anticoagulants: Secondary | ICD-10-CM | POA: Diagnosis not present

## 2014-10-27 DIAGNOSIS — Z5181 Encounter for therapeutic drug level monitoring: Secondary | ICD-10-CM | POA: Diagnosis not present

## 2014-10-27 DIAGNOSIS — I4891 Unspecified atrial fibrillation: Secondary | ICD-10-CM | POA: Diagnosis not present

## 2014-10-27 LAB — POCT INR: INR: 2.1

## 2014-11-09 ENCOUNTER — Telehealth: Payer: Self-pay | Admitting: Cardiovascular Disease

## 2014-11-09 NOTE — Telephone Encounter (Signed)
Left Amy a message that this message will be send to the coumadin clinic.  Amy  to call back for message clarification.

## 2014-11-09 NOTE — Telephone Encounter (Signed)
New Message     An order for an INR was faxed over (10/10/14) from [facility] and they have heard anything back please give them a call.

## 2014-11-10 NOTE — Telephone Encounter (Signed)
Called [facility] in reference to message from Amy on 11/10/14.  After being on the phone for 22 minutes-majority of the time on hold;nobody at [facility] knew what Amy needed from the [facility] clinic or anything about the 10/10/14 anticoagulant visit. They did state the patient was discharged from their [facility] office.  They will leave a note for Amy to call on Monday 11/13/14 to follow up.

## 2014-11-15 ENCOUNTER — Ambulatory Visit (INDEPENDENT_AMBULATORY_CARE_PROVIDER_SITE_OTHER): Payer: Medicare Other | Admitting: *Deleted

## 2014-11-15 DIAGNOSIS — Z5181 Encounter for therapeutic drug level monitoring: Secondary | ICD-10-CM

## 2014-11-15 DIAGNOSIS — Z7901 Long term (current) use of anticoagulants: Secondary | ICD-10-CM | POA: Diagnosis not present

## 2014-11-15 DIAGNOSIS — I4891 Unspecified atrial fibrillation: Secondary | ICD-10-CM | POA: Diagnosis not present

## 2014-11-15 LAB — POCT INR: INR: 2.1

## 2014-11-16 ENCOUNTER — Telehealth: Payer: Self-pay | Admitting: Cardiovascular Disease

## 2014-11-16 NOTE — Telephone Encounter (Signed)
Called and spoke to [place], she is looking for a doctor's order that was faxed over for Dr Angelena Form to sign on 11/04/14 and then fax back for the home visit dated 10/10/14.  [name], please look in Dr Camillia Herter folder to see if you have form to be signed.  This would not come to the Coumadin clinic it would go to MD to be signed.  If you do not have the form please call [name] back and she will refax to you for Dr Angelena Form to sign.  Thanks

## 2014-11-16 NOTE — Telephone Encounter (Signed)
Left message on Tracy [name]'s voicemail that I do not have paperwork and requested she refax it to our office. Fax number for office provided.

## 2014-11-16 NOTE — Telephone Encounter (Signed)
New message     Calling to see if we received an order faxed on 11-04-14 to [facility].  Fax to [phone]

## 2014-11-23 NOTE — Telephone Encounter (Signed)
Paperwork completed and given to medical records to fax

## 2014-11-27 ENCOUNTER — Telehealth: Payer: Self-pay | Admitting: Cardiovascular Disease

## 2014-11-27 NOTE — Telephone Encounter (Signed)
[facility] calling to see if we received a fax  From them in regards to this pt. Order #: [id number], please call back and advise.

## 2014-11-27 NOTE — Telephone Encounter (Signed)
Called [name] from [place]. They have not received a fax at this time.  According  Dr. Camillia Herter nurse, [name], her notes state this form was sent to medical records to be fax. Will forward to [facility] for follow-up.

## 2014-11-28 ENCOUNTER — Other Ambulatory Visit: Payer: Self-pay | Admitting: Urology

## 2014-11-28 NOTE — Telephone Encounter (Signed)
[facility] Order refaxed to [phone] Attn: Dora Sims

## 2014-11-28 NOTE — Telephone Encounter (Signed)
Will forward to medical records to refax signed orders

## 2014-12-11 ENCOUNTER — Ambulatory Visit (INDEPENDENT_AMBULATORY_CARE_PROVIDER_SITE_OTHER): Payer: Medicare Other | Admitting: *Deleted

## 2014-12-11 DIAGNOSIS — Z7901 Long term (current) use of anticoagulants: Secondary | ICD-10-CM

## 2014-12-11 DIAGNOSIS — Z5181 Encounter for therapeutic drug level monitoring: Secondary | ICD-10-CM

## 2014-12-11 DIAGNOSIS — I4891 Unspecified atrial fibrillation: Secondary | ICD-10-CM | POA: Diagnosis not present

## 2014-12-11 LAB — POCT INR: INR: 2

## 2014-12-14 ENCOUNTER — Other Ambulatory Visit (HOSPITAL_COMMUNITY): Payer: Self-pay | Admitting: Anesthesiology

## 2014-12-14 NOTE — Patient Instructions (Signed)
Florenda Watt Klemz  12/14/2014   Your procedure is scheduled on: Wednesday 12/20/2014  Report to [facility] Main  Entrance and follow signs to               [place] at [place] AM.   Call this number if you have problems the morning of surgery [phone]   Remember: ONLY 1 PERSON MAY GO WITH YOU TO SHORT STAY TO GET  READY MORNING OF [place].   Do not eat food or drink liquids :After Midnight.     Take these medicines the morning of surgery with A SIP OF WATER: Metoprolol, Diltiazem (Cardizem CD)                               You may not have any metal on your body including hair pins and              piercings  Do not wear jewelry, make-up, lotions, powders or perfumes, deodorant             Do not wear nail polish.  Do not shave  48 hours prior to surgery.              Men may shave face and neck.   Do not bring valuables to the hospital. [place].  Contacts, dentures or bridgework may not be worn into surgery.  Leave suitcase in the car. After surgery it may be brought to your room.     Patients discharged the day of surgery will not be allowed to drive home.  Name and phone number of your driver:  Special Instructions: N/A              Please read over the following fact sheets you were given: _____________________________________________________________________             [facility] - Preparing for Surgery Before surgery, you can play an important role.  Because skin is not sterile, your skin needs to be as free of germs as possible.  You can reduce the number of germs on your skin by washing with CHG (chlorahexidine gluconate) soap before surgery.  CHG is an antiseptic cleaner which kills germs and bonds with the skin to continue killing germs even after washing. Please DO NOT use if you have an allergy to CHG or antibacterial soaps.  If your skin becomes reddened/irritated stop using  the CHG and inform your nurse when you arrive at Short Stay. Do not shave (including legs and underarms) for at least 48 hours prior to the first CHG shower.  You may shave your face/neck. Please follow these instructions carefully:  1.  Shower with CHG Soap the night before surgery and the  morning of Surgery.  2.  If you choose to wash your hair, wash your hair first as usual with your  normal  shampoo.  3.  After you shampoo, rinse your hair and body thoroughly to remove the  shampoo.                           4.  Use CHG as you would any other liquid soap.  You can apply chg directly  to the skin and  wash                       Gently with a scrungie or clean washcloth.  5.  Apply the CHG Soap to your body ONLY FROM THE NECK DOWN.   Do not use on face/ open                           Wound or open sores. Avoid contact with eyes, ears mouth and genitals (private parts).                       Wash face,  Genitals (private parts) with your normal soap.             6.  Wash thoroughly, paying special attention to the area where your surgery  will be performed.  7.  Thoroughly rinse your body with warm water from the neck down.  8.  DO NOT shower/wash with your normal soap after using and rinsing off  the CHG Soap.                9.  Pat yourself dry with a clean towel.            10.  Wear clean pajamas.            11.  Place clean sheets on your bed the night of your first shower and do not  sleep with pets. Day of Surgery : Do not apply any lotions/deodorants the morning of surgery.  Please wear clean clothes to the hospital/surgery center.  FAILURE TO FOLLOW THESE INSTRUCTIONS MAY RESULT IN THE CANCELLATION OF YOUR SURGERY PATIENT SIGNATURE_________________________________  NURSE SIGNATURE__________________________________  ________________________________________________________________________   Adam Phenix  An incentive spirometer is a tool that can help keep your lungs  clear and active. This tool measures how well you are filling your lungs with each breath. Taking long deep breaths may help reverse or decrease the chance of developing breathing (pulmonary) problems (especially infection) following:  A long period of time when you are unable to move or be active. BEFORE THE PROCEDURE   If the spirometer includes an indicator to show your best effort, your nurse or respiratory therapist will set it to a desired goal.  If possible, sit up straight or lean slightly forward. Try not to slouch.  Hold the incentive spirometer in an upright position. INSTRUCTIONS FOR USE  1. Sit on the edge of your bed if possible, or sit up as far as you can in bed or on a chair. 2. Hold the incentive spirometer in an upright position. 3. Breathe out normally. 4. Place the mouthpiece in your mouth and seal your lips tightly around it. 5. Breathe in slowly and as deeply as possible, raising the piston or the ball toward the top of the column. 6. Hold your breath for 3-5 seconds or for as long as possible. Allow the piston or ball to fall to the bottom of the column. 7. Remove the mouthpiece from your mouth and breathe out normally. 8. Rest for a few seconds and repeat Steps 1 through 7 at least 10 times every 1-2 hours when you are awake. Take your time and take a few normal breaths between deep breaths. 9. The spirometer may include an indicator to show your best effort. Use the indicator as a goal to work toward during each repetition. 10. After each set of 10 deep  breaths, practice coughing to be sure your lungs are clear. If you have an incision (the cut made at the time of surgery), support your incision when coughing by placing a pillow or rolled up towels firmly against it. Once you are able to get out of bed, walk around indoors and cough well. You may stop using the incentive spirometer when instructed by your caregiver.  RISKS AND COMPLICATIONS  Take your time so you do  not get dizzy or light-headed.  If you are in pain, you may need to take or ask for pain medication before doing incentive spirometry. It is harder to take a deep breath if you are having pain. AFTER USE  Rest and breathe slowly and easily.  It can be helpful to keep track of a log of your progress. Your caregiver can provide you with a simple table to help with this. If you are using the spirometer at home, follow these instructions: [place] IF:   You are having difficultly using the spirometer.  You have trouble using the spirometer as often as instructed.  Your pain medication is not giving enough relief while using the spirometer.  You develop fever of 100.5 F (38.1 C) or higher. SEEK IMMEDIATE MEDICAL CARE IF:   You cough up bloody sputum that had not been present before.  You develop fever of 102 F (38.9 C) or greater.  You develop worsening pain at or near the incision site. MAKE SURE YOU:   Understand these instructions.  Will watch your condition.  Will get help right away if you are not doing well or get worse. Document Released: 11/17/2006 Document Revised: 09/29/2011 Document Reviewed: 01/18/2007 [facility] Patient Information 2014 [place], [place].   ________________________________________________________________________

## 2014-12-15 ENCOUNTER — Ambulatory Visit (HOSPITAL_COMMUNITY)
Admission: RE | Admit: 2014-12-15 | Discharge: 2014-12-15 | Disposition: A | Discharge status: Home / Self Care | Payer: Medicare Other | Source: Ambulatory Visit | Attending: Anesthesiology | Admitting: Anesthesiology

## 2014-12-15 ENCOUNTER — Encounter (HOSPITAL_COMMUNITY)
Admission: RE | Admit: 2014-12-15 | Discharge: 2014-12-15 | Disposition: A | Discharge status: Home / Self Care | Payer: Medicare Other | Source: Ambulatory Visit | Attending: Urology | Admitting: Urology

## 2014-12-15 ENCOUNTER — Encounter (HOSPITAL_COMMUNITY): Payer: Self-pay

## 2014-12-15 DIAGNOSIS — I10 Essential (primary) hypertension: Secondary | ICD-10-CM

## 2014-12-15 DIAGNOSIS — Z8679 Personal history of other diseases of the circulatory system: Secondary | ICD-10-CM

## 2014-12-15 DIAGNOSIS — M7989 Other specified soft tissue disorders: Secondary | ICD-10-CM | POA: Insufficient documentation

## 2014-12-15 LAB — BASIC METABOLIC PANEL
ANION GAP: 9 (ref 5–15)
BUN: 50 mg/dL — ABNORMAL HIGH (ref 6–20)
CALCIUM: 9.4 mg/dL (ref 8.9–10.3)
CO2: 28 mmol/L (ref 22–32)
CREATININE: 1.6 mg/dL — AB (ref 0.44–1.00)
Chloride: 104 mmol/L (ref 101–111)
GFR calc Af Amer: 33 mL/min — ABNORMAL LOW (ref >60)
GFR calc non Af Amer: 28 mL/min — ABNORMAL LOW (ref >60)
Glucose, Bld: 90 mg/dL (ref 65–99)
Potassium: 4.2 mmol/L (ref 3.5–5.1)
SODIUM: 141 mmol/L (ref 135–145)

## 2014-12-15 LAB — CBC
HEMATOCRIT: 30.1 % — AB (ref 36.0–46.0)
HEMOGLOBIN: 9.4 g/dL — AB (ref 12.0–15.0)
MCH: 25.6 pg — ABNORMAL LOW (ref 26.0–34.0)
MCHC: 31.2 g/dL (ref 30.0–36.0)
MCV: 82 fL (ref 78.0–100.0)
PLATELETS: 198 10*3/uL (ref 150–400)
RBC: 3.67 MIL/uL — ABNORMAL LOW (ref 3.87–5.11)
RDW: 20.5 % — ABNORMAL HIGH (ref 11.5–15.5)
WBC: 4.6 10*3/uL (ref 4.0–10.5)

## 2014-12-15 LAB — PROTIME-INR
INR: 1.95 — ABNORMAL HIGH (ref 0.00–1.49)
Prothrombin Time: 22.2 seconds — ABNORMAL HIGH (ref 11.6–15.2)

## 2014-12-15 LAB — APTT: APTT: 35 s (ref 24–37)

## 2014-12-20 ENCOUNTER — Other Ambulatory Visit: Payer: Self-pay | Admitting: Cardiovascular Disease

## 2014-12-20 ENCOUNTER — Encounter (HOSPITAL_COMMUNITY)
Admission: RE | Disposition: A | Discharge status: Home / Self Care | Payer: Self-pay | Source: Ambulatory Visit | Attending: Urology

## 2014-12-20 ENCOUNTER — Ambulatory Visit (HOSPITAL_COMMUNITY): Payer: Medicare Other | Admitting: Anesthesiology

## 2014-12-20 ENCOUNTER — Encounter (HOSPITAL_COMMUNITY): Payer: Self-pay | Admitting: *Deleted

## 2014-12-20 ENCOUNTER — Ambulatory Visit (HOSPITAL_COMMUNITY)
Admission: RE | Admit: 2014-12-20 | Discharge: 2014-12-20 | Disposition: A | Discharge status: Home / Self Care | Payer: Medicare Other | Source: Ambulatory Visit | Attending: Urology | Admitting: Urology

## 2014-12-20 DIAGNOSIS — C674 Malignant neoplasm of posterior wall of bladder: Secondary | ICD-10-CM | POA: Insufficient documentation

## 2014-12-20 DIAGNOSIS — I341 Nonrheumatic mitral (valve) prolapse: Secondary | ICD-10-CM | POA: Diagnosis not present

## 2014-12-20 DIAGNOSIS — Z882 Allergy status to sulfonamides status: Secondary | ICD-10-CM | POA: Insufficient documentation

## 2014-12-20 DIAGNOSIS — Z880 Allergy status to penicillin: Secondary | ICD-10-CM | POA: Diagnosis not present

## 2014-12-20 DIAGNOSIS — I739 Peripheral vascular disease, unspecified: Secondary | ICD-10-CM | POA: Insufficient documentation

## 2014-12-20 DIAGNOSIS — K219 Gastro-esophageal reflux disease without esophagitis: Secondary | ICD-10-CM | POA: Insufficient documentation

## 2014-12-20 DIAGNOSIS — I2510 Atherosclerotic heart disease of native coronary artery without angina pectoris: Secondary | ICD-10-CM | POA: Insufficient documentation

## 2014-12-20 DIAGNOSIS — I5031 Acute diastolic (congestive) heart failure: Secondary | ICD-10-CM | POA: Insufficient documentation

## 2014-12-20 DIAGNOSIS — E785 Hyperlipidemia, unspecified: Secondary | ICD-10-CM | POA: Diagnosis not present

## 2014-12-20 DIAGNOSIS — N289 Disorder of kidney and ureter, unspecified: Secondary | ICD-10-CM | POA: Insufficient documentation

## 2014-12-20 DIAGNOSIS — M1990 Unspecified osteoarthritis, unspecified site: Secondary | ICD-10-CM | POA: Insufficient documentation

## 2014-12-20 DIAGNOSIS — I4891 Unspecified atrial fibrillation: Secondary | ICD-10-CM | POA: Diagnosis not present

## 2014-12-20 HISTORY — PX: CYSTOSCOPY W/ RETROGRADES: SHX1426

## 2014-12-20 HISTORY — PX: TRANSURETHRAL RESECTION OF BLADDER TUMOR WITH GYRUS (TURBT-GYRUS): SHX6458

## 2014-12-20 LAB — PROTIME-INR
INR: 1.27 (ref 0.00–1.49)
PROTHROMBIN TIME: 16 s — AB (ref 11.6–15.2)

## 2014-12-20 SURGERY — TRANSURETHRAL RESECTION OF BLADDER TUMOR WITH GYRUS (TURBT-GYRUS)
Anesthesia: General

## 2014-12-20 MED ORDER — TRAMADOL HCL 50 MG PO TABS: 50.0000 mg | ORAL_TABLET | Freq: Four times a day (QID) | ORAL | Status: DC | PRN

## 2014-12-20 MED ORDER — FENTANYL CITRATE (PF) 250 MCG/5ML IJ SOLN
INTRAMUSCULAR | Status: AC
  Filled 2014-12-20: qty 5

## 2014-12-20 MED ORDER — LIDOCAINE HCL (CARDIAC) 20 MG/ML IV SOLN
INTRAVENOUS | Status: AC
  Filled 2014-12-20: qty 5

## 2014-12-20 MED ORDER — MIDAZOLAM HCL 5 MG/5ML IJ SOLN
INTRAMUSCULAR | Status: DC | PRN
  Administered 2014-12-20: .5 mg via INTRAVENOUS

## 2014-12-20 MED ORDER — PHENYLEPHRINE HCL 10 MG/ML IJ SOLN
INTRAMUSCULAR | Status: DC | PRN
  Administered 2014-12-20 (×4): 20 ug via INTRAVENOUS

## 2014-12-20 MED ORDER — SCOPOLAMINE 1 MG/3DAYS TD PT72
1.0000 | MEDICATED_PATCH | TRANSDERMAL | Status: DC
  Filled 2014-12-20: qty 1

## 2014-12-20 MED ORDER — SODIUM CHLORIDE 0.9 % IR SOLN
Status: DC | PRN
  Administered 2014-12-20: 9000 mL

## 2014-12-20 MED ORDER — CIPROFLOXACIN IN D5W 400 MG/200ML IV SOLN
400.0000 mg | INTRAVENOUS | Status: AC
  Administered 2014-12-20: 400 mg via INTRAVENOUS

## 2014-12-20 MED ORDER — LACTATED RINGERS IV SOLN
INTRAVENOUS | Status: DC
  Administered 2014-12-20: 1000 mL via INTRAVENOUS

## 2014-12-20 MED ORDER — LIDOCAINE HCL (CARDIAC) 20 MG/ML IV SOLN
INTRAVENOUS | Status: DC | PRN
  Administered 2014-12-20: 50 mg via INTRAVENOUS

## 2014-12-20 MED ORDER — PROMETHAZINE HCL 25 MG/ML IJ SOLN: 6.2500 mg | INTRAMUSCULAR | Status: DC | PRN

## 2014-12-20 MED ORDER — MIDAZOLAM HCL 2 MG/2ML IJ SOLN
INTRAMUSCULAR | Status: AC
  Filled 2014-12-20: qty 2

## 2014-12-20 MED ORDER — DEXAMETHASONE SODIUM PHOSPHATE 10 MG/ML IJ SOLN
INTRAMUSCULAR | Status: DC | PRN
  Administered 2014-12-20: 10 mg via INTRAVENOUS

## 2014-12-20 MED ORDER — HYDROMORPHONE HCL 1 MG/ML IJ SOLN
INTRAMUSCULAR | Status: AC
  Filled 2014-12-20: qty 1

## 2014-12-20 MED ORDER — ONDANSETRON HCL 4 MG/2ML IJ SOLN
INTRAMUSCULAR | Status: AC
  Filled 2014-12-20: qty 2

## 2014-12-20 MED ORDER — CIPROFLOXACIN IN D5W 400 MG/200ML IV SOLN
INTRAVENOUS | Status: AC
  Filled 2014-12-20: qty 200

## 2014-12-20 MED ORDER — HYDROMORPHONE HCL 1 MG/ML IJ SOLN: 0.2500 mg | INTRAMUSCULAR | Status: DC | PRN

## 2014-12-20 MED ORDER — DEXAMETHASONE SODIUM PHOSPHATE 10 MG/ML IJ SOLN
INTRAMUSCULAR | Status: AC
  Filled 2014-12-20: qty 1

## 2014-12-20 MED ORDER — ONDANSETRON HCL 4 MG/2ML IJ SOLN
INTRAMUSCULAR | Status: DC | PRN
  Administered 2014-12-20: 4 mg via INTRAVENOUS

## 2014-12-20 MED ORDER — FENTANYL CITRATE (PF) 100 MCG/2ML IJ SOLN
INTRAMUSCULAR | Status: DC | PRN
Start: 2014-12-20 — End: 2014-12-20
  Administered 2014-12-20: 50 ug via INTRAVENOUS

## 2014-12-20 MED ORDER — PROPOFOL 10 MG/ML IV BOLUS
INTRAVENOUS | Status: DC | PRN
  Administered 2014-12-20: 160 mg via INTRAVENOUS

## 2014-12-20 MED ORDER — PROPOFOL 10 MG/ML IV BOLUS
INTRAVENOUS | Status: AC
  Filled 2014-12-20: qty 20

## 2014-12-20 SURGICAL SUPPLY — 16 items
BAG URINE DRAINAGE (UROLOGICAL SUPPLIES) ×4 IMPLANT
BAG URO CATCHER STRL LF (DRAPE) ×4 IMPLANT
CATH FOLEY 3WAY 30CC 22FR (CATHETERS) IMPLANT
DRAPE CAMERA CLOSED 9X96 (DRAPES) ×4 IMPLANT
GLOVE BIOGEL M STRL SZ7.5 (GLOVE) ×4 IMPLANT
GOWN STRL REUS W/TWL LRG LVL3 (GOWN DISPOSABLE) ×8 IMPLANT
HOLDER FOLEY CATH W/STRAP (MISCELLANEOUS) IMPLANT
IV NS IRRIG 3000ML ARTHROMATIC (IV SOLUTION) IMPLANT
LOOP CUT BIPOLAR 24F LRG (ELECTROSURGICAL) ×4 IMPLANT
MANIFOLD NEPTUNE II (INSTRUMENTS) ×4 IMPLANT
PACK CYSTO (CUSTOM PROCEDURE TRAY) ×4 IMPLANT
PLUG CATH AND CAP STER (CATHETERS) ×4 IMPLANT
SYR 30ML LL (SYRINGE) IMPLANT
SYRINGE IRR TOOMEY STRL 70CC (SYRINGE) ×4 IMPLANT
TUBING CONNECTING 10 (TUBING) ×3 IMPLANT
TUBING CONNECTING 10' (TUBING) ×1

## 2014-12-20 NOTE — Anesthesia Preprocedure Evaluation (Addendum)
Anesthesia Evaluation  Patient identified by MRN, date of birth, ID band Patient awake    Reviewed: Allergy & Precautions, NPO status , Patient's Chart, lab work & pertinent test results  History of Anesthesia Complications Negative for: history of anesthetic complications  Airway Mallampati: II  TM Distance: >3 FB Neck ROM: Full    Dental  (+) Teeth Intact, Dental Advisory Given   Pulmonary shortness of breath,    Pulmonary exam normal       Cardiovascular hypertension, Pt. on medications + CAD, + Peripheral Vascular Disease and +CHF Normal cardiovascular exam+ dysrhythmias Atrial Fibrillation  Study Conclusions  - Left ventricle: The cavity size was normal. Wall thickness was increased increased in a pattern of mild to moderate LVH. Systolic function was normal. The estimated ejection fraction was in the range of 55% to 60%. Wall motion was normal; there were no regional wall motion abnormalities. The study was not technically sufficient to allow evaluation of LV diastolic dysfunction due to atrial fibrillation. There is evidence of elevated LA pressure.    Neuro/Psych  Headaches, negative psych ROS   GI/Hepatic Neg liver ROS, GERD-  ,  Endo/Other  negative endocrine ROS  Renal/GU Renal InsufficiencyRenal disease     Musculoskeletal   Abdominal   Peds  Hematology   Anesthesia Other Findings   Reproductive/Obstetrics                           Anesthesia Physical Anesthesia Plan  ASA: III  Anesthesia Plan: General   Post-op Pain Management:    Induction: Intravenous  Airway Management Planned: LMA  Additional Equipment:   Intra-op Plan:   Post-operative Plan: Extubation in OR  Informed Consent: I have reviewed the patients History and Physical, chart, labs and discussed the procedure including the risks, benefits and alternatives for the proposed anesthesia with  the patient or authorized representative who has indicated his/her understanding and acceptance.   Dental advisory given  Plan Discussed with:   Anesthesia Plan Comments:        Anesthesia Quick Evaluation

## 2014-12-20 NOTE — Brief Op Note (Signed)
12/20/2014  12:25 PM  PATIENT:  Tracy [name]  81 y.o. female  PRE-OPERATIVE DIAGNOSIS:  RECURRENT BLADDER CANCER  POST-OPERATIVE DIAGNOSIS:  RECURRENT BLADDER CANCER  PROCEDURE:  Procedure(s): TRANSURETHRAL RESECTION OF BLADDER TUMOR  (N/A) CYSTOSCOPY WITH RETROGRADE PYELOGRAM (Bilateral) LARGE tumor  SURGEON:  Surgeon(s) and Role:    * Alexis Frock, MD - Primary  PHYSICIAN ASSISTANT:   ASSISTANTS: Gypsy Lore MD   ANESTHESIA:   general  EBL:     BLOOD ADMINISTERED:none  DRAINS: none   LOCAL MEDICATIONS USED:  NONE  SPECIMEN:  Source of Specimen:  recurrent bladder cancer  DISPOSITION OF SPECIMEN:  PATHOLOGY  COUNTS:  YES  TOURNIQUET:  * No tourniquets in log *  DICTATION: .Other Dictation: Dictation Number [id number]  PLAN OF CARE: Discharge to home after PACU  PATIENT DISPOSITION:  PACU - hemodynamically stable.   Delay start of Pharmacological VTE agent (>24hrs) due to surgical blood loss or risk of bleeding: yes

## 2014-12-20 NOTE — Anesthesia Procedure Notes (Signed)
Procedure Name: LMA Insertion Date/Time: 12/20/2014 11:53 AM Performed by: Ofilia Neas Pre-anesthesia Checklist: Patient identified, Emergency Drugs available, Suction available, Patient being monitored and Timeout performed Patient Re-evaluated:Patient Re-evaluated prior to inductionOxygen Delivery Method: Circle system utilized Preoxygenation: Pre-oxygenation with 100% oxygen Intubation Type: IV induction LMA: LMA inserted LMA Size: 4.0 Number of attempts: 1 Placement Confirmation: positive ETCO2 Tube secured with: Tape Dental Injury: Teeth and Oropharynx as per pre-operative assessment

## 2014-12-20 NOTE — Anesthesia Postprocedure Evaluation (Signed)
Anesthesia Post Note  Patient: Tracy [name]  Procedure(s) Performed: Procedure(s) (LRB): TRANSURETHRAL RESECTION OF BLADDER TUMOR  (N/A) CYSTOSCOPY WITH RETROGRADE PYELOGRAM (Bilateral)  Anesthesia type: general  Patient location: PACU  Post pain: Pain level controlled  Post assessment: Patient's Cardiovascular Status Stable  Last Vitals:  Filed Vitals:   12/20/14 1325  BP:   Pulse: 61  Temp:   Resp: 16    Post vital signs: Reviewed and stable  Level of consciousness: sedated  Complications: No apparent anesthesia complications

## 2014-12-20 NOTE — Transfer of Care (Signed)
Immediate Anesthesia Transfer of Care Note  Patient: Tracy [name]  Procedure(s) Performed: Procedure(s): TRANSURETHRAL RESECTION OF BLADDER TUMOR  (N/A) CYSTOSCOPY WITH RETROGRADE PYELOGRAM (Bilateral)  Patient Location: PACU  Anesthesia Type:General  Level of Consciousness: awake, oriented, patient cooperative, lethargic and responds to stimulation  Airway & Oxygen Therapy: Patient Spontanous Breathing and Patient connected to face mask oxygen  Post-op Assessment: Report given to RN, Post -op Vital signs reviewed and stable and Patient moving all extremities  Post vital signs: Reviewed and stable  Last Vitals:  Filed Vitals:   12/20/14 0931  BP: 167/65  Pulse: 71  Temp: 36.6 C  Resp: 20    Complications: No apparent anesthesia complications

## 2014-12-20 NOTE — Discharge Instructions (Signed)
1. You may see some blood in the urine and may have some burning with urination for 48-72 hours. You also may notice that you have to urinate more frequently or urgently after your procedure which is normal. Take Ultram pain medicine as needed 2. You should call should you develop an inability urinate, fever > 101, persistent nausea and vomiting that prevents you from eating or drinking to stay hydrated.  3. You may resume your Coumadin (Warfarin) medicine tomorrow

## 2014-12-20 NOTE — H&P (Signed)
Tracy [name] is an 81 y.o. female.    Chief Complaint: Pre-OP Transurethral Resection of BLadder TUmor  HPI:  1  - Massive Volume High-Grade Bladder Cancer - Large Bladder Mass with Gross Hematuria by ER CT 06/2014 on eval gross hematuria. No hydro. Suspect At least T2 based on cysto appearance. Pt does not want cystectomy or chemo-radiation or other curative-intent therapy.  Recent Course 06/2014 - Two stage TURBT for maximal local control, T2G3, suspect higher based on intra-operative findings==> palliative approach (prn TURBT"s), refused cystectomy  2 - Renal Insufficiency - Cr 1.5-2.5 since 2014 by hospital labs. No hyperkalemia. CT 2015 w/o hydro or stones. LT UO not visible at recent TURBT's, but no hydro on imaging.  PMH sig for CAD/AFib/Coumadin (follows Darl[facility] Guys, cards, no prior DVT/PE). Her PCP is Angus McGuiness MD. Marlana Salvage is very independent, lives alone, and still driving at baseline. Her daugheter [name] is also very infolved at [phone].  Today Yoselin is seen to proceed with re-TURBT for tumro recurrenct with palliative intent. .   Past Medical History  Diagnosis Date  . Hyperlipidemia   . Coronary artery disease   . Venous insufficiency   . CHF (congestive heart failure)     acute diastolic heart failure  . Arrhythmia     chronic afib  . GERD (gastroesophageal reflux disease)   . Mitral valve prolapse   . Shoulder pain, right   . Swelling     both legs and feet  . Shortness of breath dyspnea   . Headache     occasional headache  . Arthritis   . Sleeping difficulties   . Atrial fibrillation   . Bladder cancer     Past Surgical History  Procedure Laterality Date  . Colonoscopy N/A 09/30/2012    Procedure: COLONOSCOPY;  Surgeon: Rogene Houston, MD;  Location: [facility];  Service: Endoscopy;  Laterality: N/A;  930  . Cardioversion      x2  . Back surgery      x2-lower back  . Transurethral resection of bladder tumor with gyrus (turbt-gyrus) N/A  07/19/2014    Procedure: TRANSURETHRAL RESECTION OF BLADDER TUMOR WITH GYRUS (TURBT-GYRUS) WITH CYSTOGRAM;  Surgeon: Alexis Frock, MD;  Location: [facility];  Service: Urology;  Laterality: N/A;  . Transurethral resection of bladder tumor with gyrus (turbt-gyrus) N/A 07/20/2014    Procedure: SECOND LOOK TRANSURETHRAL RESECTION OF BLADDER TUMOR WITH GYRUS (TURBT-GYRUS);  Surgeon: Alexis Frock, MD;  Location: [facility];  Service: Urology;  Laterality: N/A;  . Cataract extraction      Family History  Problem Relation Age of Onset  . Colon cancer Neg Hx   . Colon polyps Sister    Social History:  reports that she has never smoked. She has never used smokeless tobacco. She reports that she does not drink alcohol or use illicit drugs.  Allergies:  Allergies  Allergen Reactions  . Amoxicillin Other (See Comments)    Unknown  . Penicillins Other (See Comments)    Unknown  . Sulfonamide Derivatives Other (See Comments)    Unknown    No prescriptions prior to admission    No results found for this or any previous visit (from the past 33 hour(s)). No results found.  Review of Systems  Constitutional: Negative.   HENT: Negative.   Eyes: Negative.   Respiratory: Negative.   Cardiovascular: Negative.   Gastrointestinal: Negative.   Genitourinary: Positive for hematuria.  Musculoskeletal: Negative.   Skin: Negative.   Neurological: Negative.  Endo/Heme/Allergies: Negative.   Psychiatric/Behavioral: Negative.     There were no vitals taken for this visit. Physical Exam  Constitutional: She appears well-developed.  HENT:  Head: Normocephalic.  Eyes: Pupils are equal, round, and reactive to light.  Neck: Normal range of motion.  Cardiovascular: Normal rate.   Respiratory: Effort normal.  GI: Soft.  Genitourinary:  No CVAT  Musculoskeletal: Normal range of motion.  Neurological: She is alert.  Skin: Skin is warm.  Psychiatric: She has a normal mood and affect.      Assessment/Plan  1  - Massive Volume High-Grade Bladder Cancer -now with early local recurrence that is asymptomatic, but at size that is likely amenable to outpatient resection. Discussed this v. continues observation and repeat resection for symptoms and they desire more "pre-emptive" approach. I agree. They again understand that overall approach is palliative.  We discussed operative biopsy / transurethral resection as the best next step for diagnostic and therapeutic purposes with goals being to remove all visible cancer and obtain tissue for pathologic exam. We discussed that for some low-grade tumors, this may be all the treatment required, but that for many other tumors such as high-grade lesions, further therapy including surgery and or chemotherapy may be warranted. We also outlined the fact that any bladder cancer diagnosis will require close follow-up with periodic upper and lower tract evaluation. We discussed risks including bleeding, infection, damage to kidney / ureter / bladder including bladder perforation which can typically managed with prolonged foley catheterization. We mentioned anesthetic and other rare risks including DVT, PE, MI, and mortality. I also mentioned that adjunctive procedures such as ureteral stenting, retrograde pyelography, and ureteroscopy may be necessary to fully evaluate the urinary tract depending on intra-operative findings. After answering all questions to the patient's satisfaction, they wish to proceed.    2 - Renal Insufficiency - likely medical renal disease. This may worsen as tumor progresses.  Roslynn Holte 12/20/2014, 6:50 AM

## 2014-12-21 ENCOUNTER — Encounter (HOSPITAL_COMMUNITY): Payer: Self-pay | Admitting: Urology

## 2014-12-21 NOTE — Op Note (Signed)
NAME:  Tracy [name], Tracy [name]              ACCOUNT NO.:  [id number]  MEDICAL RECORD NO.:  [id number]  LOCATION:  [facility]                         FACILITY:  [facility]  PHYSICIAN:  Alexis Frock, MD     DATE OF BIRTH:  [birth year]  DATE OF PROCEDURE:  12/20/2014                              OPERATIVE REPORT  DIAGNOSIS:  Recurrent large volume bladder cancer.  PROCEDURE: 1. Transurethral resection of bladder tumor, volume large. 2. Bilateral retrograde pyelogram interpretation.  ASSISTANT:  Gypsy Lore, M.D.  ESTIMATED BLOOD LOSS:  150 mL.  FINDINGS: 1. Large volume recurrent bladder tumor in the posterior left wall and     another site posteriorly, total volume estimated 6 cm2. 2. Unremarkable bilateral retrograde pyelogram. 3. Neo left ureteral orifice having been previously resected the     tumor, this was widely patent. 4. No evidence of bladder perforation following resection.  INDICATION:  Tracy [name] is a very pleasant 81 year old lady with history of large volume bladder cancer.  She is status post previous staged transurethral resection at the time of massive hematuria.  She has done well in the interval and despite her large volume muscle- invasive disease, she elected a palliative approach, non-curative intent, p.r.n. resections for gross recurrence.  She was found, on surveillance cystoscopy, to have a large volume recurrent tumor that was minimally symptomatic at this point.  Options were discussed including continued surveillance versus purely palliative approach versus transurethral resection, and she wished to proceed with latter. Informed consent was obtained and placed in medical record.  PROCEDURE IN DETAIL:  The patient being Tracy [name] was verified, procedure being transurethral resection of bladder tumor and retrograde pyelography was confirmed.  Procedure was carried out.  Time-out was performed.  Intravenous antibiotics were administered.  General  LMA anesthesia was introduced.  The patient was placed into a low lithotomy position.  Sterile field was created by prepping and draping of the patient's vagina, introitus, and proximal thighs using iodine x3.  Next, cystourethroscopy was performed using a 23-French rigid cystoscope with 12-degree offset lens.  Inspection of the urinary bladder revealed a large foci of papillary tumor, approximately 5 cm in diameter.  This was superior lateral to the left neo-ureteral orifice which was visibly patent.  The right ureteral orifice was unremarkable.  There was another satellite tumor approximately 1.5 cm in diameter, directly posteriorly. Attention was directed towards the retrograde pyelography.  The left ureteral orifice was cannulated with 6-French end-hole catheter and left retrograde pyelogram was obtained.  Left retrograde pyelogram demonstrated a single left ureter, single system left kidney.  No filling defects or narrowing noted.  Similarly, right retrograde pyelogram was obtained.  Right retrograde pyelogram demonstrated single right ureter, single system right kidney.  No filling defects or narrowing noted.  The cystoscope was then exchanged with a 26-French continuous flow resectoscope sheath with visual obturator and using medium-sized resectoscope loop with bipolar current, very careful systematic resection was performed and all visible papillary tumor down what appeared to be the superficial seromuscular layers of the bladder taking great care to avoid bladder perforation.  As the patient has already known muscle-invasive disease, purposely very deep resection was not performed, but all grossly  visible tumor was removed.  The base of this area was fulgurated, tumor fragments irrigated, and set aside for permanent pathology.  Following these maneuvers, hemostasis appeared excellent.  There was complete removal of all visible bladder tumor. There was no evidence of  perforation.  The orifices remained visibly patent.  There was no indication for catheterization.  Bladder was emptied per cystoscope.  The procedure was then terminated.  The patient tolerated the procedure well.  There were no immediate periprocedural complications.  The patient was taken to the postanesthesia care unit in stable condition.          ______________________________ Alexis Frock, MD     TM/MEDQ  D:  12/20/2014  T:  12/21/2014  Job:  [id number]

## 2014-12-27 ENCOUNTER — Ambulatory Visit (INDEPENDENT_AMBULATORY_CARE_PROVIDER_SITE_OTHER): Payer: Medicare Other | Admitting: *Deleted

## 2014-12-27 DIAGNOSIS — Z7901 Long term (current) use of anticoagulants: Secondary | ICD-10-CM

## 2014-12-27 DIAGNOSIS — Z5181 Encounter for therapeutic drug level monitoring: Secondary | ICD-10-CM | POA: Diagnosis not present

## 2014-12-27 DIAGNOSIS — I4891 Unspecified atrial fibrillation: Secondary | ICD-10-CM

## 2014-12-27 LAB — POCT INR: INR: 1.3

## 2015-01-08 ENCOUNTER — Ambulatory Visit (INDEPENDENT_AMBULATORY_CARE_PROVIDER_SITE_OTHER): Payer: Medicare Other | Admitting: *Deleted

## 2015-01-08 DIAGNOSIS — I4891 Unspecified atrial fibrillation: Secondary | ICD-10-CM

## 2015-01-08 DIAGNOSIS — Z7901 Long term (current) use of anticoagulants: Secondary | ICD-10-CM | POA: Diagnosis not present

## 2015-01-08 DIAGNOSIS — Z5181 Encounter for therapeutic drug level monitoring: Secondary | ICD-10-CM | POA: Diagnosis not present

## 2015-01-08 LAB — POCT INR: INR: 2.1

## 2015-01-12 ENCOUNTER — Ambulatory Visit (HOSPITAL_COMMUNITY): Payer: Medicare Other | Attending: Cardiovascular Disease

## 2015-01-12 ENCOUNTER — Ambulatory Visit (INDEPENDENT_AMBULATORY_CARE_PROVIDER_SITE_OTHER): Payer: Medicare Other | Admitting: Cardiovascular Disease

## 2015-01-12 VITALS — BP 136/55 | HR 62 | Resp 12 | Ht 63.0 in | Wt 141.4 lb

## 2015-01-12 DIAGNOSIS — M79609 Pain in unspecified limb: Secondary | ICD-10-CM | POA: Diagnosis not present

## 2015-01-12 DIAGNOSIS — I2510 Atherosclerotic heart disease of native coronary artery without angina pectoris: Secondary | ICD-10-CM

## 2015-01-12 DIAGNOSIS — M7989 Other specified soft tissue disorders: Secondary | ICD-10-CM | POA: Diagnosis present

## 2015-01-12 DIAGNOSIS — I080 Rheumatic disorders of both mitral and aortic valves: Secondary | ICD-10-CM

## 2015-01-12 DIAGNOSIS — I482 Chronic atrial fibrillation, unspecified: Secondary | ICD-10-CM

## 2015-01-12 DIAGNOSIS — I350 Nonrheumatic aortic (valve) stenosis: Secondary | ICD-10-CM

## 2015-01-12 NOTE — Progress Notes (Signed)
Chief Complaint  Patient presents with  . Shortness of Breath    History of Present Illness: 81 yo WF with history of CAD, chronic atrial fibrillation managed with rate control medications and Coumadin, diastolic CHF, HTN and hyperlipidemia here today for cardiac follow up. She's had non-obstructive CAD by catheterization in 2003 with 40% mid LAD, plaque disease in other vessels. Echo August 2013 normal LV function, mild MR, Mild AI, mild AS, mild TR. Mild carotid disease by dopplers September 2014. She has been diagnosed with bladder cancer and has had three bladder procedures. She was admitted twice over the last 6 months with CHF felt to be due to diastolic CHF. She has been taking Lasix daily.   She is here today for follow up. No bleeding issues. No exertional chest pain. Breathing ok. She continues to have LE edema. Her left leg is more swollen than her right and this is new over the last few weeks.   Primary Care Physician: Marjean Donna  Last Lipid Profile:Lipid Panel     Component Value Date/Time   CHOL 141 11/04/2013 1231   TRIG 93.0 11/04/2013 1231   HDL 67.40 11/04/2013 1231   CHOLHDL 2 11/04/2013 1231   VLDL 18.6 11/04/2013 1231   LDLCALC 55 11/04/2013 1231    Past Medical History  Diagnosis Date  . Hyperlipidemia   . Coronary artery disease   . Venous insufficiency   . CHF (congestive heart failure)     acute diastolic heart failure  . Arrhythmia     chronic afib  . GERD (gastroesophageal reflux disease)   . Mitral valve prolapse   . Shoulder pain, right   . Swelling     both legs and feet  . Shortness of breath dyspnea   . Headache     occasional headache  . Arthritis   . Sleeping difficulties   . Atrial fibrillation   . Bladder cancer     Past Surgical History  Procedure Laterality Date  . Colonoscopy N/A 09/30/2012    Procedure: COLONOSCOPY;  Surgeon: Rogene Houston, MD;  [facility]: [facility];  Service: Endoscopy;  Laterality: N/A;  930  .  Cardioversion      x2  . Back surgery      x2-lower back  . Transurethral resection of bladder tumor with gyrus (turbt-gyrus) N/A 07/19/2014    Procedure: TRANSURETHRAL RESECTION OF BLADDER TUMOR WITH GYRUS (TURBT-GYRUS) WITH CYSTOGRAM;  Surgeon: Alexis Frock, MD;  Location: [facility];  Service: Urology;  Laterality: N/A;  . Transurethral resection of bladder tumor with gyrus (turbt-gyrus) N/A 07/20/2014    Procedure: SECOND LOOK TRANSURETHRAL RESECTION OF BLADDER TUMOR WITH GYRUS (TURBT-GYRUS);  Surgeon: Alexis Frock, MD;  Location: [facility];  Service: Urology;  Laterality: N/A;  . Cataract extraction    . Transurethral resection of bladder tumor with gyrus (turbt-gyrus) N/A 12/20/2014    Procedure: TRANSURETHRAL RESECTION OF BLADDER TUMOR ;  Surgeon: Alexis Frock, MD;  Location: [facility];  Service: Urology;  Laterality: N/A;  . Cystoscopy w/ retrogrades Bilateral 12/20/2014    Procedure: CYSTOSCOPY WITH RETROGRADE PYELOGRAM;  Surgeon: Alexis Frock, MD;  Location: [facility];  Service: Urology;  Laterality: Bilateral;    Current Outpatient Prescriptions  Medication Sig Dispense Refill  . acetaminophen (TYLENOL) 500 MG tablet Take 500 mg by mouth every 6 (six) hours as needed for pain.    Marland Kitchen aspirin EC 81 MG tablet Take 81 mg by mouth daily at 12 noon.    . calcium  carbonate (OS-CAL) 600 MG TABS Take 600 mg by mouth daily at 12 noon.     . diltiazem (CARDIZEM CD) 120 MG 24 hr capsule TAKE (1) CAPSULE DAILY 30 capsule 0  . docusate sodium 100 MG CAPS Take 100 mg by mouth 2 (two) times daily. 30 capsule 0  . furosemide (LASIX) 40 MG tablet Take 1 tablet (40 mg total) by mouth daily. 30 tablet 5  . LORazepam (ATIVAN) 0.5 MG tablet Take 0.5 mg by mouth at bedtime.    . meclizine (ANTIVERT) 12.5 MG tablet Take 12.5 mg by mouth 3 (three) times daily as needed for dizziness.     . metoprolol (LOPRESSOR) 50 MG tablet Take 50 mg by mouth every morning.  4  . Multiple Vitamins-Minerals (MULTIVITAMINS THER.  W/MINERALS) TABS Take 1 tablet by mouth daily at 12 noon.     . nitroGLYCERIN (NITROSTAT) 0.4 MG SL tablet Place 1 tablet (0.4 mg total) under the tongue every 5 (five) minutes as needed for chest pain (CP or SOB). 25 tablet 12  . rosuvastatin (CRESTOR) 20 MG tablet Take 20 mg by mouth daily after lunch.     . traMADol (ULTRAM) 50 MG tablet Take 1 tablet (50 mg total) by mouth every 6 (six) hours as needed for moderate pain. 21 tablet 0  . warfarin (COUMADIN) 5 MG tablet TAKE AS DIRECTED. (Patient taking differently: Takes 1 tablet on Fridays and then half a tablet on all other days.) 30 tablet 3   No current facility-administered medications for this visit.    Allergies  Allergen Reactions  . Amoxicillin Other (See Comments)    Unknown  . Penicillins Other (See Comments)    Unknown  . Sulfonamide Derivatives Other (See Comments)    Unknown    History   Social History  . Marital Status: Married    Spouse Name: N/A  . Number of Children: N/A  . Years of Education: N/A   Occupational History  . retired    Social History Main Topics  . Smoking status: Never Smoker   . Smokeless tobacco: Never Used  . Alcohol Use: No  . Drug Use: No  . Sexual Activity: Not on file   Other Topics Concern  . Not on file   Social History Narrative    Family History  Problem Relation Age of Onset  . Colon cancer Neg Hx   . Colon polyps Sister     Review of Systems:  As stated in the HPI and otherwise negative.   BP 136/55 mmHg  Pulse 62  Resp 12  Ht 5\' 3"  (1.6 m)  Wt 141 lb 6.4 oz (64.139 kg)  BMI 25.05 kg/m2  SpO2 98%  Physical Examination: General: Well developed, well nourished, NAD HEENT: OP clear, mucus membranes moist SKIN: warm, dry. No rashes. Neuro: No focal deficits Musculoskeletal: Muscle strength 5/5 all ext Psychiatric: Mood and affect normal Neck: No JVD, no carotid bruits, no thyromegaly, no lymphadenopathy. Lungs:Clear bilaterally, no wheezes, rhonci,  crackles Cardiovascular: Irregular irregular. No murmurs, gallops or rubs. Abdomen:Soft. Bowel sounds present. Non-tender.  Extremities: No lower extremity edema. Pulses are 2 + in the bilateral DP/PT.  Echo 08/15/14: Left ventricle: The cavity size was normal. Wall thickness was increased increased in a pattern of mild to moderate LVH. Systolic function was normal. The estimated ejection fraction was in the range of 55% to 60%. Wall motion was normal; there were no regional wall motion abnormalities. The study was not technically sufficient to allow  evaluation of LV diastolic dysfunction due to atrial fibrillation. There is evidence of elevated LA pressure. - Aortic valve: Moderately calcified annulus. Trileaflet; moderately thickened leaflets. There was mild to moderate stenosis. There was mild regurgitation. The AI vena contracta is 0.2 cm . Valve area (VTI): 1.08 cm^2. Valve area (Vmax): 1.21 cm^2. Regurgitation pressure half-time: 553 ms. - Mitral valve: Mildly calcified annulus. Normal thickness leaflets . There was mild regurgitation. - Left atrium: The atrium was severely dilated. - Pulmonary veins: There is systolic blunting of pulmonary vein flow consistent with elevated LA pressure. - Right ventricle: The cavity size was mildly to moderately dilated. - Right atrium: The atrium was severely dilated. - Atrial septum: A patent foramen ovale cannot be excluded. - Tricuspid valve: There was mild-moderate regurgitation. - Pulmonary arteries: Systolic pressure was moderately increased. PA peak pressure: 49 mm Hg (S). - Technically difficult study.  Lower ext venous dopplers: No evidence of DVT.   EKG:  EKG is not ordered today. The ekg ordered today demonstrates   Recent Labs: 08/17/2014: Magnesium 2.1 08/20/2014: B Natriuretic Peptide 466.0*; TSH 4.947* 08/21/2014: ALT 13 12/15/2014: BUN 50*; Creatinine, Ser 1.60*; Hemoglobin 9.4*; Platelets 198;  Potassium 4.2; Sodium 141   Lipid Panel    Component Value Date/Time   CHOL 141 11/04/2013 1231   TRIG 93.0 11/04/2013 1231   HDL 67.40 11/04/2013 1231   CHOLHDL 2 11/04/2013 1231   VLDL 18.6 11/04/2013 1231   LDLCALC 55 11/04/2013 1231     Wt Readings from Last 3 Encounters:  01/12/15 141 lb 6.4 oz (64.139 kg)  12/20/14 143 lb 12.8 oz (65.227 kg)  12/15/14 143 lb 12.8 oz (65.227 kg)     Other studies Reviewed: Additional studies/ records that were reviewed today include: . Review of the above records demonstrates:   Assessment and Plan:   1. Atrial fibrillation: Rate controlled. Continue Cardizem and metoprolol for rate control. Continue coumadin for anti-coagulation  2. CAD: Mild by cath 2003. She has had no exertional chest pain. Continue current therapy.   3. Mitral regurgitation: Mild by echo January 2016.  4. Aortic stenosis: Mild by echo January 2016.   5. Hyperlipidemia: Lipids well controlled. Continue statin.   6. Left leg swelling: No DVT on venous dopplers today. Continue Lasix  7. Chronic diastolic CHF: continue Lasix daily. Prn lasix for weight gain  Current medicines are reviewed at length with the patient today.  The patient does not have concerns regarding medicines.  The following changes have been made:  no change  Labs/ tests ordered today include:  No orders of the defined types were placed in this encounter.    Disposition:   FU with me in 6 months  Signed, Lauree Chandler, MD 01/12/2015 4:54 PM    [facility] [place], [place], [place]  [address] Phone: [phone]; Fax: [phone]

## 2015-01-12 NOTE — Patient Instructions (Addendum)
Medication Instructions:  Your physician recommends that you continue on your current medications as directed. Please refer to the Current Medication list given to you today.    Labwork: none  Testing/Procedures: Your physician has requested that you have a lower or upper extremity venous duplex. This test is an ultrasound of the veins in the legs or arms. It looks at venous blood flow that carries blood from the heart to the legs or arms. Allow one hour for a Lower Venous exam. Allow thirty minutes for an Upper Venous exam. There are no restrictions or special instructions. To be done today    Follow-Up:  Your physician wants you to follow-up in: 6 months.  You will receive a reminder letter in the mail two months in advance. If you don't receive a letter, please call our office to schedule the follow-up appointment.

## 2015-01-12 NOTE — Progress Notes (Unsigned)
Patient ID: Tracy [name], female   DOB: 05/20/1928, 81 y.o.   MRN: [id number] Left LE negative for DVT.

## 2015-01-19 ENCOUNTER — Other Ambulatory Visit: Payer: Self-pay | Admitting: Cardiovascular Disease

## 2015-02-05 ENCOUNTER — Ambulatory Visit (INDEPENDENT_AMBULATORY_CARE_PROVIDER_SITE_OTHER): Payer: Medicare Other | Admitting: *Deleted

## 2015-02-05 DIAGNOSIS — Z7901 Long term (current) use of anticoagulants: Secondary | ICD-10-CM

## 2015-02-05 DIAGNOSIS — Z5181 Encounter for therapeutic drug level monitoring: Secondary | ICD-10-CM | POA: Diagnosis not present

## 2015-02-05 DIAGNOSIS — I4891 Unspecified atrial fibrillation: Secondary | ICD-10-CM

## 2015-02-05 LAB — POCT INR: INR: 2

## 2015-02-19 DIAGNOSIS — Z9289 Personal history of other medical treatment: Secondary | ICD-10-CM

## 2015-02-19 HISTORY — DX: Personal history of other medical treatment: Z92.89

## 2015-03-07 ENCOUNTER — Ambulatory Visit (INDEPENDENT_AMBULATORY_CARE_PROVIDER_SITE_OTHER): Payer: Medicare Other | Admitting: *Deleted

## 2015-03-07 DIAGNOSIS — Z7901 Long term (current) use of anticoagulants: Secondary | ICD-10-CM | POA: Diagnosis not present

## 2015-03-07 DIAGNOSIS — I4891 Unspecified atrial fibrillation: Secondary | ICD-10-CM

## 2015-03-07 DIAGNOSIS — Z5181 Encounter for therapeutic drug level monitoring: Secondary | ICD-10-CM | POA: Diagnosis not present

## 2015-03-07 LAB — POCT INR: INR: 1.8

## 2015-03-08 ENCOUNTER — Telehealth: Payer: Self-pay | Admitting: *Deleted

## 2015-03-08 NOTE — Telephone Encounter (Signed)
Coumadin instructions / tg

## 2015-03-08 NOTE — Telephone Encounter (Signed)
Verified coumadin schedule with patient and she verbalized understanding.

## 2015-03-11 ENCOUNTER — Inpatient Hospital Stay (HOSPITAL_COMMUNITY)
Admission: EM | Admit: 2015-03-11 | Discharge: 2015-03-17 | DRG: 696 | Disposition: A | Discharge status: Home Health | Payer: Medicare Other | Attending: Family Medicine | Admitting: Family Medicine

## 2015-03-11 ENCOUNTER — Encounter (HOSPITAL_COMMUNITY): Payer: Self-pay | Admitting: *Deleted

## 2015-03-11 DIAGNOSIS — I4891 Unspecified atrial fibrillation: Secondary | ICD-10-CM | POA: Diagnosis present

## 2015-03-11 DIAGNOSIS — D649 Anemia, unspecified: Secondary | ICD-10-CM | POA: Diagnosis present

## 2015-03-11 DIAGNOSIS — R310 Gross hematuria: Secondary | ICD-10-CM | POA: Diagnosis not present

## 2015-03-11 DIAGNOSIS — N183 Chronic kidney disease, stage 3 unspecified: Secondary | ICD-10-CM | POA: Diagnosis present

## 2015-03-11 DIAGNOSIS — M1990 Unspecified osteoarthritis, unspecified site: Secondary | ICD-10-CM | POA: Diagnosis present

## 2015-03-11 DIAGNOSIS — I482 Chronic atrial fibrillation: Secondary | ICD-10-CM | POA: Diagnosis present

## 2015-03-11 DIAGNOSIS — I2510 Atherosclerotic heart disease of native coronary artery without angina pectoris: Secondary | ICD-10-CM | POA: Diagnosis present

## 2015-03-11 DIAGNOSIS — K219 Gastro-esophageal reflux disease without esophagitis: Secondary | ICD-10-CM | POA: Diagnosis present

## 2015-03-11 DIAGNOSIS — I509 Heart failure, unspecified: Secondary | ICD-10-CM | POA: Diagnosis present

## 2015-03-11 DIAGNOSIS — Z66 Do not resuscitate: Secondary | ICD-10-CM | POA: Diagnosis present

## 2015-03-11 DIAGNOSIS — Z7901 Long term (current) use of anticoagulants: Secondary | ICD-10-CM

## 2015-03-11 DIAGNOSIS — C679 Malignant neoplasm of bladder, unspecified: Secondary | ICD-10-CM | POA: Diagnosis present

## 2015-03-11 DIAGNOSIS — E785 Hyperlipidemia, unspecified: Secondary | ICD-10-CM | POA: Diagnosis present

## 2015-03-11 DIAGNOSIS — R319 Hematuria, unspecified: Secondary | ICD-10-CM | POA: Diagnosis present

## 2015-03-11 DIAGNOSIS — D500 Iron deficiency anemia secondary to blood loss (chronic): Secondary | ICD-10-CM | POA: Diagnosis present

## 2015-03-11 NOTE — ED Notes (Signed)
Family states pt says she started bleeding w/ urination around 0900 tonight. Bright red blood noted in commode after urination.

## 2015-03-11 NOTE — ED Provider Notes (Signed)
CSN: [id number]     Arrival date & time 03/11/15  2312 History  This chart was scribed for Tracy Rice, MD by Helane Gunther, ED Scribe. This patient was seen in room APA10/APA10 and the patient's care was started at 11:35 PM.    Chief Complaint  Patient presents with  . Hematuria   The history is provided by the patient. No language interpreter was used.   HPI Comments: [name] is a 81 y.o. female with a PMHx of bladder cancer who underwent resection 6/16 now presents to the Emergency Department complaining of hematuria (x4) onset 2 hours prior to presentation. She notes no other symptoms. Suggested he states no lightheadedness, dizziness, chest pain, shortness breath, nausea, vomiting.  She has a PSHx for the bladder cancer. She is on warfarin for A-fib.  Past Medical History  Diagnosis Date  . Hyperlipidemia   . Coronary artery disease   . Venous insufficiency   . CHF (congestive heart failure)     acute diastolic heart failure  . Arrhythmia     chronic afib  . GERD (gastroesophageal reflux disease)   . Mitral valve prolapse   . Shoulder pain, right   . Swelling     both legs and feet  . Shortness of breath dyspnea   . Headache     occasional headache  . Arthritis   . Sleeping difficulties   . Atrial fibrillation   . Bladder cancer    Past Surgical History  Procedure Laterality Date  . Colonoscopy N/A 09/30/2012    Procedure: COLONOSCOPY;  Surgeon: Rogene Houston, MD;  Location: [facility];  Service: Endoscopy;  Laterality: N/A;  930  . Cardioversion      x2  . Back surgery      x2-lower back  . Transurethral resection of bladder tumor with gyrus (turbt-gyrus) N/A 07/19/2014    Procedure: TRANSURETHRAL RESECTION OF BLADDER TUMOR WITH GYRUS (TURBT-GYRUS) WITH CYSTOGRAM;  Surgeon: Alexis Frock, MD;  Location: [facility];  Service: Urology;  Laterality: N/A;  . Transurethral resection of bladder tumor with gyrus (turbt-gyrus) N/A 07/20/2014    Procedure:  SECOND LOOK TRANSURETHRAL RESECTION OF BLADDER TUMOR WITH GYRUS (TURBT-GYRUS);  Surgeon: Alexis Frock, MD;  Location: [facility];  Service: Urology;  Laterality: N/A;  . Cataract extraction    . Transurethral resection of bladder tumor with gyrus (turbt-gyrus) N/A 12/20/2014    Procedure: TRANSURETHRAL RESECTION OF BLADDER TUMOR ;  Surgeon: Alexis Frock, MD;  Location: [facility];  Service: Urology;  Laterality: N/A;  . Cystoscopy w/ retrogrades Bilateral 12/20/2014    Procedure: CYSTOSCOPY WITH RETROGRADE PYELOGRAM;  Surgeon: Alexis Frock, MD;  Location: [facility];  Service: Urology;  Laterality: Bilateral;   Family History  Problem Relation Age of Onset  . Colon cancer Neg Hx   . Colon polyps Sister    Social History  Substance Use Topics  . Smoking status: Never Smoker   . Smokeless tobacco: Never Used  . Alcohol Use: No   OB History    No data available     Review of Systems  Constitutional: Negative for fever and chills.  Respiratory: Negative for cough and shortness of breath.   Cardiovascular: Negative for chest pain.  Gastrointestinal: Negative for nausea, vomiting and abdominal pain.  Genitourinary: Positive for hematuria. Negative for dysuria, flank pain and difficulty urinating.  Neurological: Negative for dizziness, syncope, weakness, light-headedness and numbness.  All other systems reviewed and are negative.   Allergies  Amoxicillin; Penicillins; and  Sulfonamide derivatives  Home Medications   Prior to Admission medications   Medication Sig Start Date End Date Taking? Authorizing Provider  acetaminophen (TYLENOL) 500 MG tablet Take 500 mg by mouth every 6 (six) hours as needed for pain.   Yes Historical Provider, MD  aspirin EC 81 MG tablet Take 81 mg by mouth daily at 12 noon.   Yes Historical Provider, MD  calcium carbonate (OS-CAL) 600 MG TABS Take 600 mg by mouth daily at 12 noon.    Yes Historical Provider, MD  diltiazem (CARDIZEM CD) 120 MG 24 hr capsule TAKE (1)  CAPSULE DAILY 01/23/15  Yes Burnell Blanks, MD  docusate sodium 100 MG CAPS Take 100 mg by mouth 2 (two) times daily. 07/22/14  Yes Franchot Gallo, MD  furosemide (LASIX) 40 MG tablet Take 1 tablet (40 mg total) by mouth daily. 08/23/14  Yes Marjean Donna, MD  meclizine (ANTIVERT) 12.5 MG tablet Take 12.5 mg by mouth 3 (three) times daily as needed for dizziness.    Yes Historical Provider, MD  metoprolol (LOPRESSOR) 50 MG tablet Take 50 mg by mouth every morning. 06/23/14  Yes Historical Provider, MD  Multiple Vitamins-Minerals (MULTIVITAMINS THER. W/MINERALS) TABS Take 1 tablet by mouth daily at 12 noon.    Yes Historical Provider, MD  nitroGLYCERIN (NITROSTAT) 0.4 MG SL tablet Place 1 tablet (0.4 mg total) under the tongue every 5 (five) minutes as needed for chest pain (CP or SOB). 08/23/14  Yes Angus McInnis, MD  rosuvastatin (CRESTOR) 20 MG tablet Take 20 mg by mouth daily after lunch.    Yes Historical Provider, MD  warfarin (COUMADIN) 5 MG tablet TAKE AS DIRECTED. Patient taking differently: Takes 1 tablet on Fridays and then half a tablet on all other days. 08/24/14  Yes Burnell Blanks, MD  LORazepam (ATIVAN) 0.5 MG tablet Take 0.5 mg by mouth at bedtime.    Historical Provider, MD  traMADol (ULTRAM) 50 MG tablet Take 1 tablet (50 mg total) by mouth every 6 (six) hours as needed for moderate pain. 12/20/14   Edwyna Ready, MD   BP 184/70 mmHg  Pulse 77  Temp(Src) 97.7 F (36.5 C) (Oral)  Resp 18  Ht 5\' 6"  (1.676 m)  Wt 155 lb (70.308 kg)  BMI 25.03 kg/m2  SpO2 93% Physical Exam  Constitutional: She is oriented to person, place, and time. She appears well-developed and well-nourished. No distress.  HENT:  Head: Normocephalic and atraumatic.  Mouth/Throat: Oropharynx is clear and moist.  Eyes: EOM are normal. Pupils are equal, round, and reactive to light.  Neck: Normal range of motion. Neck supple.  Cardiovascular: Normal rate and regular rhythm.   Pulmonary/Chest: Effort  normal and breath sounds normal. No respiratory distress. She has no wheezes. She has no rales.  Abdominal: Soft. Bowel sounds are normal. She exhibits no distension and no mass. There is no tenderness. There is no rebound and no guarding.  Musculoskeletal: Normal range of motion. She exhibits no edema or tenderness.  No CVA tenderness bilaterally.  Neurological: She is alert and oriented to person, place, and time.  Skin: Skin is warm and dry. No rash noted. No erythema.  Psychiatric: She has a normal mood and affect. Her behavior is normal.  Nursing note and vitals reviewed.   ED Course  Procedures  DIAGNOSTIC STUDIES: Oxygen Saturation is 97% on RA, adequate by my interpretation.    COORDINATION OF CARE: 11:38 PM - Discussed plans to order diagnostic studies. Pt advised of plan  for treatment and pt agrees.  Labs Review Labs Reviewed  CBC WITH DIFFERENTIAL/PLATELET - Abnormal; Notable for the following:    Hemoglobin 10.3 (*)    HCT 32.1 (*)    RDW 19.2 (*)    All other components within normal limits  COMPREHENSIVE METABOLIC PANEL - Abnormal; Notable for the following:    Glucose, Bld 109 (*)    BUN 45 (*)    Creatinine, Ser 1.60 (*)    GFR calc non Af Amer 28 (*)    GFR calc Af Amer 33 (*)    All other components within normal limits  PROTIME-INR - Abnormal; Notable for the following:    Prothrombin Time 26.7 (*)    INR 2.51 (*)    All other components within normal limits  URINALYSIS, ROUTINE W REFLEX MICROSCOPIC (NOT AT [facility]) - Abnormal; Notable for the following:    Color, Urine RED (*)    APPearance CLOUDY (*)    Glucose, UA 100 (*)    Hgb urine dipstick LARGE (*)    Ketones, ur 15 (*)    Protein, ur >300 (*)    Urobilinogen, UA 4.0 (*)    Nitrite POSITIVE (*)    Leukocytes, UA MODERATE (*)    All other components within normal limits  URINE MICROSCOPIC-ADD ON - Abnormal; Notable for the following:    Bacteria, UA FEW (*)    All other components within normal  limits  TYPE AND SCREEN    Imaging Review No results found. I have personally reviewed and evaluated these images and lab results as part of my medical decision-making.   EKG Interpretation   Date/Time:  Sunday March 11 2015 23:54:01 EDT Ventricular Rate:  58 PR Interval:    QRS Duration: 100 QT Interval:  416 QTC Calculation: 409 R Axis:   74 Text Interpretation:  Atrial fibrillation RSR' in V1 or V2, right VCD or  RVH Confirmed by Lita Mains  MD, Lavert Matousek ([id number]) on 03/12/2015 12:25:36 AM      MDM   Final diagnoses:  Hematuria, gross    I personally performed the services described in this documentation, which was scribed in my presence. The recorded information has been reviewed and is accurate. Patient remained asymptomatic. Persistent hematuria but hemoglobin and vital signs are stable. Vitamin K given in the emergency department.  Discussed with Dr. Minus Liberty. Agrees with plan to reverse Coumadin and observe in the hospital. States his urology partner will round on the patient in the morning. [facility] will admit.  Tracy Rice, MD 03/12/15 [phone]

## 2015-03-12 ENCOUNTER — Encounter (HOSPITAL_COMMUNITY): Payer: Self-pay | Admitting: Internal Medicine

## 2015-03-12 DIAGNOSIS — C679 Malignant neoplasm of bladder, unspecified: Secondary | ICD-10-CM | POA: Diagnosis not present

## 2015-03-12 DIAGNOSIS — Z7901 Long term (current) use of anticoagulants: Secondary | ICD-10-CM

## 2015-03-12 DIAGNOSIS — R310 Gross hematuria: Secondary | ICD-10-CM | POA: Diagnosis not present

## 2015-03-12 DIAGNOSIS — I482 Chronic atrial fibrillation: Secondary | ICD-10-CM

## 2015-03-12 DIAGNOSIS — R319 Hematuria, unspecified: Secondary | ICD-10-CM | POA: Diagnosis present

## 2015-03-12 DIAGNOSIS — N189 Chronic kidney disease, unspecified: Secondary | ICD-10-CM

## 2015-03-12 DIAGNOSIS — Z8551 Personal history of malignant neoplasm of bladder: Secondary | ICD-10-CM | POA: Diagnosis not present

## 2015-03-12 LAB — COMPREHENSIVE METABOLIC PANEL
ALT: 15 U/L (ref 14–54)
AST: 22 U/L (ref 15–41)
Albumin: 3.9 g/dL (ref 3.5–5.0)
Alkaline Phosphatase: 73 U/L (ref 38–126)
Anion gap: 9 (ref 5–15)
BUN: 45 mg/dL — ABNORMAL HIGH (ref 6–20)
CO2: 28 mmol/L (ref 22–32)
Calcium: 9.2 mg/dL (ref 8.9–10.3)
Chloride: 102 mmol/L (ref 101–111)
Creatinine, Ser: 1.6 mg/dL — ABNORMAL HIGH (ref 0.44–1.00)
GFR calc Af Amer: 33 mL/min — ABNORMAL LOW (ref >60)
GFR calc non Af Amer: 28 mL/min — ABNORMAL LOW (ref >60)
Glucose, Bld: 109 mg/dL — ABNORMAL HIGH (ref 65–99)
Potassium: 3.9 mmol/L (ref 3.5–5.1)
Sodium: 139 mmol/L (ref 135–145)
Total Bilirubin: 0.6 mg/dL (ref 0.3–1.2)
Total Protein: 6.6 g/dL (ref 6.5–8.1)

## 2015-03-12 LAB — CBC
HCT: 32.8 % — ABNORMAL LOW (ref 36.0–46.0)
HCT: 31.1 % — ABNORMAL LOW (ref 36.0–46.0)
HEMATOCRIT: 34 % — AB (ref 36.0–46.0)
HEMATOCRIT: 31.7 % — AB (ref 36.0–46.0)
HEMOGLOBIN: 10.1 g/dL — AB (ref 12.0–15.0)
Hemoglobin: 11.1 g/dL — ABNORMAL LOW (ref 12.0–15.0)
Hemoglobin: 10.7 g/dL — ABNORMAL LOW (ref 12.0–15.0)
Hemoglobin: 10.3 g/dL — ABNORMAL LOW (ref 12.0–15.0)
MCH: 26.8 pg (ref 26.0–34.0)
MCH: 26.8 pg (ref 26.0–34.0)
MCH: 26.8 pg (ref 26.0–34.0)
MCH: 26.8 pg (ref 26.0–34.0)
MCHC: 32.6 g/dL (ref 30.0–36.0)
MCHC: 32.6 g/dL (ref 30.0–36.0)
MCHC: 32.5 g/dL (ref 30.0–36.0)
MCHC: 32.5 g/dL (ref 30.0–36.0)
MCV: 82.1 fL (ref 78.0–100.0)
MCV: 82 fL (ref 78.0–100.0)
MCV: 82.6 fL (ref 78.0–100.0)
MCV: 82.5 fL (ref 78.0–100.0)
PLATELETS: 161 10*3/uL (ref 150–400)
PLATELETS: 142 10*3/uL — AB (ref 150–400)
Platelets: 164 10*3/uL (ref 150–400)
Platelets: 153 10*3/uL (ref 150–400)
RBC: 4.14 MIL/uL (ref 3.87–5.11)
RBC: 4 MIL/uL (ref 3.87–5.11)
RBC: 3.84 MIL/uL — ABNORMAL LOW (ref 3.87–5.11)
RBC: 3.77 MIL/uL — AB (ref 3.87–5.11)
RDW: 19.3 % — AB (ref 11.5–15.5)
RDW: 19.2 % — ABNORMAL HIGH (ref 11.5–15.5)
RDW: 19.3 % — AB (ref 11.5–15.5)
RDW: 19.5 % — ABNORMAL HIGH (ref 11.5–15.5)
WBC: 7.2 10*3/uL (ref 4.0–10.5)
WBC: 6.8 10*3/uL (ref 4.0–10.5)
WBC: 5.9 10*3/uL (ref 4.0–10.5)
WBC: 5.8 10*3/uL (ref 4.0–10.5)

## 2015-03-12 LAB — PROTIME-INR
INR: 2.38 — AB (ref 0.00–1.49)
INR: 2.51 — ABNORMAL HIGH (ref 0.00–1.49)
PROTHROMBIN TIME: 25.7 s — AB (ref 11.6–15.2)
Prothrombin Time: 26.7 seconds — ABNORMAL HIGH (ref 11.6–15.2)

## 2015-03-12 LAB — CBC WITH DIFFERENTIAL/PLATELET
Basophils Absolute: 0 10*3/uL (ref 0.0–0.1)
Basophils Relative: 0 % (ref 0–1)
Eosinophils Absolute: 0.1 10*3/uL (ref 0.0–0.7)
Eosinophils Relative: 2 % (ref 0–5)
HCT: 32.1 % — ABNORMAL LOW (ref 36.0–46.0)
HEMOGLOBIN: 10.3 g/dL — AB (ref 12.0–15.0)
LYMPHS ABS: 1.9 10*3/uL (ref 0.7–4.0)
Lymphocytes Relative: 36 % (ref 12–46)
MCH: 26.6 pg (ref 26.0–34.0)
MCHC: 32.1 g/dL (ref 30.0–36.0)
MCV: 82.9 fL (ref 78.0–100.0)
MONOS PCT: 11 % (ref 3–12)
Monocytes Absolute: 0.6 10*3/uL (ref 0.1–1.0)
NEUTROS ABS: 2.7 10*3/uL (ref 1.7–7.7)
NEUTROS PCT: 51 % (ref 43–77)
Platelets: 150 10*3/uL (ref 150–400)
RBC: 3.87 MIL/uL (ref 3.87–5.11)
RDW: 19.2 % — ABNORMAL HIGH (ref 11.5–15.5)
WBC: 5.2 10*3/uL (ref 4.0–10.5)

## 2015-03-12 LAB — URINALYSIS, ROUTINE W REFLEX MICROSCOPIC
Bilirubin Urine: NEGATIVE
GLUCOSE, UA: 100 mg/dL — AB
Ketones, ur: 15 mg/dL — AB
Nitrite: POSITIVE — AB
PH: 8 (ref 5.0–8.0)
SPECIFIC GRAVITY, URINE: 1.015 (ref 1.005–1.030)
Urobilinogen, UA: 4 mg/dL — ABNORMAL HIGH (ref 0.0–1.0)

## 2015-03-12 LAB — URINE MICROSCOPIC-ADD ON

## 2015-03-12 LAB — TYPE AND SCREEN
ABO/RH(D): A POS
Antibody Screen: NEGATIVE

## 2015-03-12 MED ORDER — METOPROLOL TARTRATE 50 MG PO TABS
50.0000 mg | ORAL_TABLET | ORAL | Status: DC
  Administered 2015-03-12 – 2015-03-17 (×6): 50 mg via ORAL
  Filled 2015-03-12 (×6): qty 1

## 2015-03-12 MED ORDER — SODIUM CHLORIDE 0.9 % IJ SOLN
3.0000 mL | Freq: Two times a day (BID) | INTRAMUSCULAR | Status: DC
  Administered 2015-03-12 – 2015-03-16 (×11): 3 mL via INTRAVENOUS

## 2015-03-12 MED ORDER — LORAZEPAM 0.5 MG PO TABS
0.5000 mg | ORAL_TABLET | Freq: Every day | ORAL | Status: DC
  Administered 2015-03-13: 0.5 mg via ORAL
  Filled 2015-03-12 (×4): qty 1

## 2015-03-12 MED ORDER — DOCUSATE SODIUM 100 MG PO CAPS
100.0000 mg | ORAL_CAPSULE | Freq: Two times a day (BID) | ORAL | Status: DC
  Administered 2015-03-12 – 2015-03-17 (×11): 100 mg via ORAL
  Filled 2015-03-12 (×11): qty 1

## 2015-03-12 MED ORDER — DILTIAZEM HCL ER COATED BEADS 120 MG PO CP24
120.0000 mg | ORAL_CAPSULE | Freq: Every day | ORAL | Status: DC
  Administered 2015-03-12 – 2015-03-17 (×6): 120 mg via ORAL
  Filled 2015-03-12 (×6): qty 1

## 2015-03-12 MED ORDER — ROSUVASTATIN CALCIUM 20 MG PO TABS
20.0000 mg | ORAL_TABLET | Freq: Every day | ORAL | Status: DC
  Administered 2015-03-12 – 2015-03-16 (×5): 20 mg via ORAL
  Filled 2015-03-12 (×5): qty 1

## 2015-03-12 MED ORDER — NITROGLYCERIN 0.4 MG SL SUBL: 0.4000 mg | SUBLINGUAL_TABLET | SUBLINGUAL | Status: DC | PRN

## 2015-03-12 MED ORDER — HYDRALAZINE HCL 20 MG/ML IJ SOLN
5.0000 mg | INTRAMUSCULAR | Status: DC | PRN
  Administered 2015-03-12: 5 mg via INTRAVENOUS
  Filled 2015-03-12: qty 1

## 2015-03-12 MED ORDER — PHYTONADIONE 5 MG PO TABS
10.0000 mg | ORAL_TABLET | Freq: Once | ORAL | Status: AC
  Administered 2015-03-12: 10 mg via ORAL
  Filled 2015-03-12: qty 2

## 2015-03-12 MED ORDER — ACETAMINOPHEN 500 MG PO TABS
500.0000 mg | ORAL_TABLET | Freq: Four times a day (QID) | ORAL | Status: DC | PRN
  Administered 2015-03-16: 500 mg via ORAL
  Filled 2015-03-12: qty 1

## 2015-03-12 MED ORDER — ASPIRIN EC 81 MG PO TBEC: 81.0000 mg | DELAYED_RELEASE_TABLET | Freq: Every day | ORAL | Status: DC

## 2015-03-12 NOTE — Progress Notes (Signed)
Subjective: The patient is alert and oriented this morning vital signs are stable. She still has hematuria. She does have a history of bladder cancer and is under care of urology. She apparently had resection of bladder tumor. She has a history of atrial fibrillation  Has been anticoagulated. Objective: Vital signs in last 24 hours: Temp:  [97.7 F (36.5 C)-98 F (36.7 C)] 97.9 F (36.6 C) (08/22 0501) Pulse Rate:  [59-84] 81 (08/22 0501) Resp:  [16-20] 18 (08/22 0501) BP: (147-212)/(70-113) 147/71 mmHg (08/22 0501) SpO2:  [93 %-97 %] 97 % (08/22 0501) Weight:  [70.308 kg (155 lb)] 70.308 kg (155 lb) (08/21 2343) Weight change:  Last BM Date: 03/11/15  Intake/Output from previous day:   Intake/Output this shift:    Physical Exam: General appearance patient is alert and oriented  HEENT negative  Neck supple no JVD or thyroid abnormalities  Heart regular rhythm no murmurs  Lungs clear to P&A  Abdomen the palpable organs or masses  Extremities free of edema   Recent Labs  03/11/15 2351 03/12/15 0501  WBC 5.2 7.2  HGB 10.3* 11.1*  HCT 32.1* 34.0*  PLT 150 164   BMET  Recent Labs  03/11/15 2351  NA 139  K 3.9  CL 102  CO2 28  GLUCOSE 109*  BUN 45*  CREATININE 1.60*  CALCIUM 9.2    Studies/Results: No results found.  Medications:  . aspirin EC  81 mg Oral Q1200  . diltiazem  120 mg Oral Daily  . docusate sodium  100 mg Oral BID  . LORazepam  0.5 mg Oral QHS  . metoprolol  50 mg Oral [id number]  . rosuvastatin  20 mg Oral QPC lunch  . sodium chloride  3 mL Intravenous Q12H        Assessment/Plan: 1 hematuria history of bladder cancer-plan to reverse INR with vitamin K-continue to monitor hemoglobin and hematocrit currently hemoglobin is 11.1  Chronic atrial fibrillation-continue to monitor Patient has urology consult. According to notes they plan to see her today.      Amiee Wiley G 03/12/2015, 6:38 AM

## 2015-03-12 NOTE — Consult Note (Signed)
Subjective: I was asked to see Tracy [name] in consultation for gross hematuria.   She has a history of extensive high grade invasive urothelial carcinoma of the bladder and has had 2 TURBT's with the most recent on 6/1 by Dr. Tresa Moore.   She has declined cystoscopy.  She is on anticoagulation therapy for afib and presented to the ER with gross hematuria last night.   She has been voiding incontinently.   She denies bladder pain.  She has been given Vit K but continues to have bloody urine.  ROS:  Review of Systems  Constitutional: Negative for fever and chills.  Respiratory: Positive for shortness of breath.   Cardiovascular: Negative for chest pain.  Gastrointestinal: Negative for abdominal pain.  Genitourinary: Positive for hematuria.  All other systems reviewed and are negative.   Allergies  Allergen Reactions  . Amoxicillin Other (See Comments)    Unknown  . Penicillins Other (See Comments)    Unknown  . Sulfonamide Derivatives Other (See Comments)    Unknown    Past Medical History  Diagnosis Date  . Hyperlipidemia   . Coronary artery disease   . Venous insufficiency   . CHF (congestive heart failure)     acute diastolic heart failure  . Arrhythmia     chronic afib  . GERD (gastroesophageal reflux disease)   . Mitral valve prolapse   . Shoulder pain, right   . Swelling     both legs and feet  . Shortness of breath dyspnea   . Headache     occasional headache  . Arthritis   . Sleeping difficulties   . Atrial fibrillation   . Bladder cancer     Past Surgical History  Procedure Laterality Date  . Colonoscopy N/A 09/30/2012    Procedure: COLONOSCOPY;  Surgeon: Rogene Houston, MD;  Location: [facility];  Service: Endoscopy;  Laterality: N/A;  930  . Cardioversion      x2  . Back surgery      x2-lower back  . Transurethral resection of bladder tumor with gyrus (turbt-gyrus) N/A 07/19/2014    Procedure: TRANSURETHRAL RESECTION OF BLADDER TUMOR WITH GYRUS  (TURBT-GYRUS) WITH CYSTOGRAM;  Surgeon: Alexis Frock, MD;  Location: [facility];  Service: Urology;  Laterality: N/A;  . Transurethral resection of bladder tumor with gyrus (turbt-gyrus) N/A 07/20/2014    Procedure: SECOND LOOK TRANSURETHRAL RESECTION OF BLADDER TUMOR WITH GYRUS (TURBT-GYRUS);  Surgeon: Alexis Frock, MD;  Location: [facility];  Service: Urology;  Laterality: N/A;  . Cataract extraction    . Transurethral resection of bladder tumor with gyrus (turbt-gyrus) N/A 12/20/2014    Procedure: TRANSURETHRAL RESECTION OF BLADDER TUMOR ;  Surgeon: Alexis Frock, MD;  Location: [facility];  Service: Urology;  Laterality: N/A;  . Cystoscopy w/ retrogrades Bilateral 12/20/2014    Procedure: CYSTOSCOPY WITH RETROGRADE PYELOGRAM;  Surgeon: Alexis Frock, MD;  Location: [facility];  Service: Urology;  Laterality: Bilateral;    Social History   Social History  . Marital Status: Married    Spouse [name]: N/A  . Number of Children: N/A  . Years of Education: N/A   Occupational History  . retired    Social History Main Topics  . Smoking status: Never Smoker   . Smokeless tobacco: Never Used  . Alcohol Use: No  . Drug Use: No  . Sexual Activity: Not on file   Other Topics Concern  . Not on file   Social History Narrative    Family History  Problem Relation Age of Onset  . Colon cancer Neg Hx   . Colon polyps Sister    Past, social and family history reviewed.   Anti-infectives: Anti-infectives    None      Current Facility-Administered Medications  Medication Dose Route Frequency Provider Last Rate Last Dose  . acetaminophen (TYLENOL) tablet 500 mg  500 mg Oral Q6H PRN Orvan Falconer, MD      . diltiazem (CARDIZEM CD) 24 hr capsule 120 mg  120 mg Oral Daily Orvan Falconer, MD   120 mg at 03/12/15 0854  . docusate sodium (COLACE) capsule 100 mg  100 mg Oral BID Orvan Falconer, MD   100 mg at 03/12/15 0855  . hydrALAZINE (APRESOLINE) injection 5 mg  5 mg Intravenous Q4H PRN Orvan Falconer, MD   5 mg at 03/12/15  0405  . LORazepam (ATIVAN) tablet 0.5 mg  0.5 mg Oral QHS Orvan Falconer, MD      . metoprolol (LOPRESSOR) tablet 50 mg  50 mg Oral Charmaine Downs, MD   50 mg at 03/12/15 1829  . nitroGLYCERIN (NITROSTAT) SL tablet 0.4 mg  0.4 mg Sublingual Q5 min PRN Orvan Falconer, MD      . rosuvastatin (CRESTOR) tablet 20 mg  20 mg Oral QPC lunch Orvan Falconer, MD   20 mg at 03/12/15 1307  . sodium chloride 0.9 % injection 3 mL  3 mL Intravenous Q12H Orvan Falconer, MD   3 mL at 03/12/15 0855     Objective: Vital signs in last 24 hours: Temp:  [97.7 F (36.5 C)-98.1 F (36.7 C)] 98.1 F (36.7 C) (08/22 1334) Pulse Rate:  [59-84] 80 (08/22 1334) Resp:  [16-20] 16 (08/22 1334) BP: (140-212)/(52-113) 140/52 mmHg (08/22 1334) SpO2:  [93 %-98 %] 98 % (08/22 1334) Weight:  [70.308 kg (155 lb)] 70.308 kg (155 lb) (08/21 2343)  Intake/Output from previous day:   Intake/Output this shift: Total I/O In: -  Out: 800 [Urine:800]   Physical Exam  Constitutional: She is oriented to person, place, and time and well-developed, well-nourished, and in no distress.  HENT:  Head: Normocephalic and atraumatic.  Neck: Normal range of motion. Neck supple.  Cardiovascular: Normal heart sounds.  An irregularly irregular rhythm present.  Pulmonary/Chest: Effort normal and breath sounds normal.  Abdominal: Soft. Bowel sounds are normal. She exhibits mass (In the suprapubic area consistent with a full bladder. ). There is no tenderness.  Genitourinary:  She has introital stenosis with blood clots at the introitus  Musculoskeletal: Normal range of motion. She exhibits no edema or tenderness.  Lymphadenopathy:       Right: No inguinal and no supraclavicular adenopathy present.       Left: No inguinal and no supraclavicular adenopathy present.  Neurological: She is alert and oriented to person, place, and time.  Skin: Skin is warm and dry.  Psychiatric: Mood and affect normal.    Lab Results:   Recent Labs  03/12/15 1515  03/12/15 2045  WBC 5.9 5.8  HGB 10.3* 10.1*  HCT 31.7* 31.1*  PLT 142* 153   BMET  Recent Labs  03/11/15 2351  NA 139  K 3.9  CL 102  CO2 28  GLUCOSE 109*  BUN 45*  CREATININE 1.60*  CALCIUM 9.2   PT/INR  Recent Labs  03/11/15 2351 03/12/15 0501  LABPROT 26.7* 25.7*  INR 2.51* 2.38*   ABG No results for input(s): PHART, HCO3 in the last 72 hours.  Invalid input(s): PCO2, PO2  Studies/Results: Pertinent labs and notes reviewed.   Procedure:  I placed a 62fr foley with aseptic technique and aspirated 832ml of bloody urine with clots.  The initial 255ml was clot.  Once the bladder was empty, she was irrigated with several hundred ml of saline.  Additional small clots were removed and the urine nearly cleared but remained slightly pink.  The foley was placed to SD.   Assessment: Hematuria History of bladder cancer with most recent resection on 12/21/14 by Dr. Tresa Moore with comment that all visible tumor was removed.   Patient has declined cystectomy.  She is admitted now with gross hematuria on anticoagulation.   On exam her bladder was palpable but she had no pain.  A 43fr foley was placed and the bladder was irrigated with return of a large volume of clot and urine totaling 828ml.   The foley will be left to drainage with an order to irrigate prn.     If the bleeding doesn't resolve with normalization of the INR, she may need to have cystoscopy and fulguration.    We will continue to follow closely.      CC: Dr. Marjean Donna.     Harish Bram J 03/12/2015 [phone]

## 2015-03-12 NOTE — Assessment & Plan Note (Addendum)
History of bladder cancer with most recent resection on 12/21/14 by Dr. Tresa Moore with comment that all visible tumor was removed.   Patient has declined cystectomy.  She is admitted with gross hematuria on anticoagulation.    The urine has cleared and her Hgb is stable.  I will order removal of the foley.  She can be discharged home when cleared by medicine but I would recommend not restarting anticoagulation at this time as she is at high risk for rebleeding with persistent invasive bladder cancer.   She has f/u scheduled with Dr. Tresa Moore at the end of September.

## 2015-03-12 NOTE — Care Management Note (Signed)
Case Management Note  Patient Details  Name: Tracy [name] MRN: [id number] Date of Birth: 1928/05/06  Expected Discharge Date:   03/14/2015               Expected Discharge Plan:  Home/Self Care  In-House Referral:  NA  Discharge planning Services  CM Consult  Post Acute Care Choice:  NA Choice offered to:  NA  DME Arranged:    DME Agency:     HH Arranged:    HH Agency:     Status of Service:  In process, will continue to follow  Medicare Important Message Given:    Date Medicare IM Given:    Medicare IM give by:    Date Additional Medicare IM Given:    Additional Medicare Important Message give by:     If discussed at [place] of Stay Meetings, dates discussed:    Additional Comments: Pt is from home, lives alone and is independent with ADL's. Pt uses a walker for ambulation and has a WC if needed. Pt has used [facility] in the past but is not currently active. Pt plans to return home at DC with self care. No CM needs noted at this time, will cont to follow.  Sherald Barge, RN 03/12/2015, 1:50 PM

## 2015-03-12 NOTE — H&P (Signed)
[facility] History and Physical  Tracy [name] GXQ:[id number] DOB: 1928/02/17    PCP:   [name], MD   Chief Complaint:  Hematuria  HPI: Tracy [name] is an 81 y.o. female with hx of bladder cancer, s/p TURBT x 2, most recently June 2016 (Dr Tresa Moore),  Hx of afib on anticoagulation with therapeutic INR of 2.5, aortic stenosis, CHF, known CAD, HLD, presented to the ER with gross hematuria tonight.  She denied lightheadedness, CP, SOB, black or bloody stool, or abdominal pain.  Evalatuion in the ER showed stable hemodynamics, Hb of 10.2, Platelet count normal, Cr of 1.6, and normal WBC.  She was given Vit K orally, and urologist was consulted by EDP, and will see her tomorrow.  Hospitalist was asked to admit her for further monitoring.   Rewiew of Systems:  Constitutional: Negative for malaise, fever and chills. No significant weight loss or weight gain Eyes: Negative for eye pain, redness and discharge, diplopia, visual changes, or flashes of light. ENMT: Negative for ear pain, hoarseness, nasal congestion, sinus pressure and sore throat. No headaches; tinnitus, drooling, or problem swallowing. Cardiovascular: Negative for chest pain, palpitations, diaphoresis, dyspnea and peripheral edema. ; No orthopnea, PND Respiratory: Negative for cough, hemoptysis, wheezing and stridor. No pleuritic chestpain. Gastrointestinal: Negative for nausea, vomiting, diarrhea, constipation, abdominal pain, melena, blood in stool, hematemesis, jaundice and rectal bleeding.    Genitourinary: Negative for frequency, dysuria, incontinence,flank pain  Musculoskeletal: Negative for back pain and neck pain. Negative for swelling and trauma.;  Skin: . Negative for pruritus, rash, abrasions, bruising and skin lesion.; ulcerations Neuro: Negative for headache, lightheadedness and neck stiffness. Negative for weakness, altered level of consciousness , altered mental status, extremity weakness, burning feet,  involuntary movement, seizure and syncope.  Psych: negative for anxiety, depression, insomnia, tearfulness, panic attacks, hallucinations, paranoia, suicidal or homicidal ideation    Past Medical History  Diagnosis Date  . Hyperlipidemia   . Coronary artery disease   . Venous insufficiency   . CHF (congestive heart failure)     acute diastolic heart failure  . Arrhythmia     chronic afib  . GERD (gastroesophageal reflux disease)   . Mitral valve prolapse   . Shoulder pain, right   . Swelling     both legs and feet  . Shortness of breath dyspnea   . Headache     occasional headache  . Arthritis   . Sleeping difficulties   . Atrial fibrillation   . Bladder cancer     Past Surgical History  Procedure Laterality Date  . Colonoscopy N/A 09/30/2012    Procedure: COLONOSCOPY;  Surgeon: Rogene Houston, MD;  Location: [facility];  Service: Endoscopy;  Laterality: N/A;  930  . Cardioversion      x2  . Back surgery      x2-lower back  . Transurethral resection of bladder tumor with gyrus (turbt-gyrus) N/A 07/19/2014    Procedure: TRANSURETHRAL RESECTION OF BLADDER TUMOR WITH GYRUS (TURBT-GYRUS) WITH CYSTOGRAM;  Surgeon: Alexis Frock, MD;  Location: [facility];  Service: Urology;  Laterality: N/A;  . Transurethral resection of bladder tumor with gyrus (turbt-gyrus) N/A 07/20/2014    Procedure: SECOND LOOK TRANSURETHRAL RESECTION OF BLADDER TUMOR WITH GYRUS (TURBT-GYRUS);  Surgeon: Alexis Frock, MD;  Location: [facility];  Service: Urology;  Laterality: N/A;  . Cataract extraction    . Transurethral resection of bladder tumor with gyrus (turbt-gyrus) N/A 12/20/2014    Procedure: TRANSURETHRAL RESECTION OF BLADDER TUMOR ;  Surgeon: Alexis Frock, MD;  Location: [facility];  Service: Urology;  Laterality: N/A;  . Cystoscopy w/ retrogrades Bilateral 12/20/2014    Procedure: CYSTOSCOPY WITH RETROGRADE PYELOGRAM;  Surgeon: Alexis Frock, MD;  Location: [facility];  Service: Urology;  Laterality:  Bilateral;    Medications:  HOME MEDS: Prior to Admission medications   Medication Sig Start Date End Date Taking? Authorizing Provider  acetaminophen (TYLENOL) 500 MG tablet Take 500 mg by mouth every 6 (six) hours as needed for pain.   Yes Historical Provider, MD  aspirin EC 81 MG tablet Take 81 mg by mouth daily at 12 noon.   Yes Historical Provider, MD  calcium carbonate (OS-CAL) 600 MG TABS Take 600 mg by mouth daily at 12 noon.    Yes Historical Provider, MD  diltiazem (CARDIZEM CD) 120 MG 24 hr capsule TAKE (1) CAPSULE DAILY 01/23/15  Yes Burnell Blanks, MD  docusate sodium 100 MG CAPS Take 100 mg by mouth 2 (two) times daily. 07/22/14  Yes Franchot Gallo, MD  furosemide (LASIX) 40 MG tablet Take 1 tablet (40 mg total) by mouth daily. 08/23/14  Yes Marjean Donna, MD  meclizine (ANTIVERT) 12.5 MG tablet Take 12.5 mg by mouth 3 (three) times daily as needed for dizziness.    Yes Historical Provider, MD  metoprolol (LOPRESSOR) 50 MG tablet Take 50 mg by mouth every morning. 06/23/14  Yes Historical Provider, MD  Multiple Vitamins-Minerals (MULTIVITAMINS THER. W/MINERALS) TABS Take 1 tablet by mouth daily at 12 noon.    Yes Historical Provider, MD  nitroGLYCERIN (NITROSTAT) 0.4 MG SL tablet Place 1 tablet (0.4 mg total) under the tongue every 5 (five) minutes as needed for chest pain (CP or SOB). 08/23/14  Yes Angus McInnis, MD  rosuvastatin (CRESTOR) 20 MG tablet Take 20 mg by mouth daily after lunch.    Yes Historical Provider, MD  warfarin (COUMADIN) 5 MG tablet TAKE AS DIRECTED. Patient taking differently: Takes 1 tablet on Fridays and then half a tablet on all other days. 08/24/14  Yes Burnell Blanks, MD  LORazepam (ATIVAN) 0.5 MG tablet Take 0.5 mg by mouth at bedtime.    Historical Provider, MD  traMADol (ULTRAM) 50 MG tablet Take 1 tablet (50 mg total) by mouth every 6 (six) hours as needed for moderate pain. 12/20/14   Edwyna Ready, MD     Allergies:  Allergies  Allergen  Reactions  . Amoxicillin Other (See Comments)    Unknown  . Penicillins Other (See Comments)    Unknown  . Sulfonamide Derivatives Other (See Comments)    Unknown    Social History:   reports that she has never smoked. She has never used smokeless tobacco. She reports that she does not drink alcohol or use illicit drugs.  Family History: Family History  Problem Relation Age of Onset  . Colon cancer Neg Hx   . Colon polyps Sister      Physical Exam: Filed Vitals:   03/11/15 2343 03/12/15 0035 03/12/15 0100 03/12/15 0130  BP: 166/90 212/108 172/113 184/70  Pulse: 72 81 59 77  Temp: 97.7 F (36.5 C)     TempSrc: Oral     Resp: 20 18 16 18   Height: 5\' 6"  (1.676 m)     Weight: 70.308 kg (155 lb)     SpO2: 97% 95% 94% 93%   Blood pressure 184/70, pulse 77, temperature 97.7 F (36.5 C), temperature source Oral, resp. rate 18, height 5\' 6"  (1.676 m), weight 70.308 kg (  155 lb), SpO2 93 %.  GEN:  Pleasant  patient lying in the stretcher in no acute distress; cooperative with exam. PSYCH:  alert and oriented x4; does not appear anxious or depressed; affect is appropriate. HEENT: Mucous membranes pink and anicteric; PERRLA; EOM intact; no cervical lymphadenopathy nor thyromegaly or carotid bruit; no JVD; There were no stridor. Neck is very supple. Breasts:: Not examined CHEST WALL: No tenderness CHEST: Normal respiration, clear to auscultation bilaterally.  HEART: Regular rate and rhythm.  There is a soft SEM murmur, rub, or gallops.   BACK: No kyphosis or scoliosis; no CVA tenderness ABDOMEN: soft and non-tender; no masses, no organomegaly, normal abdominal bowel sounds; no pannus; no intertriginous candida. There is no rebound and no distention. Rectal Exam: Not done EXTREMITIES: No bone or joint deformity; age-appropriate arthropathy of the hands and knees; no edema; no ulcerations.  There is no calf tenderness. Genitalia: not examined PULSES: 2+ and symmetric SKIN: Normal  hydration no rash or ulceration CNS: Cranial nerves 2-12 grossly intact no focal lateralizing neurologic deficit.  Speech is fluent; uvula elevated with phonation, facial symmetry and tongue midline. DTR are normal bilaterally, cerebella exam is intact, barbinski is negative and strengths are equaled bilaterally.  No sensory loss.   Labs on Admission:  Basic Metabolic Panel:  Recent Labs Lab 03/11/15 2351  NA 139  K 3.9  CL 102  CO2 28  GLUCOSE 109*  BUN 45*  CREATININE 1.60*  CALCIUM 9.2   Liver Function Tests:  Recent Labs Lab 03/11/15 2351  AST 22  ALT 15  ALKPHOS 73  BILITOT 0.6  PROT 6.6  ALBUMIN 3.9   No results for input(s): LIPASE, AMYLASE in the last 168 hours. No results for input(s): AMMONIA in the last 168 hours. CBC:  Recent Labs Lab 03/11/15 2351  WBC 5.2  NEUTROABS 2.7  HGB 10.3*  HCT 32.1*  MCV 82.9  PLT 150   Assessment/Plan Present on Admission:  . ATRIAL FIBRILLATION, CHRONIC . Bladder cancer- surgery 07/19/14 . Chronic renal insufficiency, stage III (moderate) . Anemia  PLAN: Will admit her to telemetry.  Hold Coumadin and reverse INR with Vit K.  Follow Hct every 6 hours with type and screen.  Continue with her cardizem and betablocker.  Await urology's consultation in the am.  She is stable.  I updated her daughter at her bedside, and confirmed that she is a DNR.  She has a golden rod form.   Thank you for allowing me to participate in her care.   Other plans as per orders.  Code Status: DNR.   Orvan Falconer, MD. [facility] Pager [phone] 7pm to 7am.  03/12/2015, 2:22 AM

## 2015-03-13 DIAGNOSIS — R310 Gross hematuria: Secondary | ICD-10-CM | POA: Diagnosis not present

## 2015-03-13 LAB — CBC
HEMATOCRIT: 29.7 % — AB (ref 36.0–46.0)
HEMOGLOBIN: 9.8 g/dL — AB (ref 12.0–15.0)
MCH: 27.2 pg (ref 26.0–34.0)
MCHC: 33 g/dL (ref 30.0–36.0)
MCV: 82.5 fL (ref 78.0–100.0)
Platelets: 141 10*3/uL — ABNORMAL LOW (ref 150–400)
RBC: 3.6 MIL/uL — ABNORMAL LOW (ref 3.87–5.11)
RDW: 19.1 % — ABNORMAL HIGH (ref 11.5–15.5)
WBC: 5.7 10*3/uL (ref 4.0–10.5)

## 2015-03-13 LAB — PROTIME-INR
INR: 1.62 — AB (ref 0.00–1.49)
Prothrombin Time: 19.3 seconds — ABNORMAL HIGH (ref 11.6–15.2)

## 2015-03-13 NOTE — Progress Notes (Addendum)
  Subjective: Patient reports no significant bladder pain. She is continuing to have hematuria.  Objective: Vital signs in last 24 hours: Temp:  [97.7 F (36.5 C)-98.1 F (36.7 C)] 98.1 F (36.7 C) (08/23 0600) Pulse Rate:  [79-82] 79 (08/23 0600) Resp:  [16-20] 20 (08/23 0600) BP: (140-173)/(52-77) 144/54 mmHg (08/23 0600) SpO2:  [96 %-98 %] 97 % (08/23 0600)  Intake/Output from previous day: 08/22 0701 - 08/23 0700 In: 240 [P.O.:240] Out: 1000 [Urine:1000] Intake/Output this shift:    Physical Exam:  Constitutional: Vital signs reviewed. WD WN in NAD   Eyes: PERRL, No scleral icterus.   Pulmonary/Chest: Normal effort  Urine is still grossly bloody. Lab Results:  Recent Labs  03/12/15 1515 03/12/15 2045 03/13/15 0303  HGB 10.3* 10.1* 9.8*  HCT 31.7* 31.1* 29.7*   BMET  Recent Labs  03/11/15 2351  NA 139  K 3.9  CL 102  CO2 28  GLUCOSE 109*  BUN 45*  CREATININE 1.60*  CALCIUM 9.2    Recent Labs  03/11/15 2351 03/12/15 0501 03/13/15 0303  INR 2.51* 2.38* 1.62*   No results for input(s): LABURIN in the last 72 hours. Results for orders placed or performed during the hospital encounter of 08/15/14  MRSA PCR Screening     Status: None   Collection Time: 08/15/14  8:30 AM  Result Value Ref Range Status   MRSA by PCR NEGATIVE NEGATIVE Final    Comment:        The GeneXpert MRSA Assay (FDA approved for NASAL specimens only), is one component of a comprehensive MRSA colonization surveillance program. It is not intended to diagnose MRSA infection nor to guide or monitor treatment for MRSA infections.    I attempted bladder irrigation. However, there is not an adequate bladder irrigation kit here on the floor-only Asepto syringes which are bulb syringes, and cannot be aspirated adequately.   Studies/Results: No results found.  Assessment/Plan:   Gross hematuria most likely secondary to combination of recurrent urothelial carcinoma of the  bladder as well as anticoagulation.    This persists, I'm unable to irrigate adequately at the present time due to inadequate equipment here on the floor.    I will send for a proper irrigation kit, and will be back later this evening to irrigate more fully.    I came back for a second visit with the patient, who is still experiencing gross hematuria. I irrigated the bladder/Foley catheter with 500 mL of saline-approximate 100 mL of clot was obtained. Irrigation was continued until clear.  I will add every 2 hour bladder irrigation. We will follow her up-she may well need cystoscopy/fulguration of bladder tumors if she does not clear coming off of anticoagulation.  Franchot Gallo M 03/13/2015, 1:03 PM

## 2015-03-13 NOTE — Progress Notes (Signed)
Subjective: The patient is alert and oriented this morning vital signs are stable. She still has hematuria. She does have a history of bladder cancer and his been seen by urology. She has had resection of bladder tumor. She does have history of atrial fibrillation and has been on anticoagulation but this is been discontinued. Her current hemoglobin is 9.8  Objective: Vital signs in last 24 hours: Temp:  [97.7 F (36.5 C)-98.1 F (36.7 C)] 98.1 F (36.7 C) (08/23 0600) Pulse Rate:  [79-82] 79 (08/23 0600) Resp:  [16-20] 20 (08/23 0600) BP: (140-173)/(52-77) 144/54 mmHg (08/23 0600) SpO2:  [96 %-98 %] 97 % (08/23 0600) Weight change:  Last BM Date: 03/12/15  Intake/Output from previous day: 08/22 0701 - 08/23 0700 In: 240 [P.O.:240] Out: 800 [Urine:800] Intake/Output this shift: Total I/O In: -  Out: 800 [Urine:800]  Physical Exam: Gen. appearance-the patient is alert and oriented  HEENT negative  Neck supple no JVD or thyroid abnormalities  Lungs clear to P&A  Abdomen no palpable organs or masses  Extremities free of edema  Patient has indwelling catheter     Recent Labs  03/12/15 2045 03/13/15 0303  WBC 5.8 5.7  HGB 10.1* 9.8*  HCT 31.1* 29.7*  PLT 153 141*   BMET  Recent Labs  03/11/15 2351  NA 139  K 3.9  CL 102  CO2 28  GLUCOSE 109*  BUN 45*  CREATININE 1.60*  CALCIUM 9.2    Studies/Results: No results found.  Medications:  . diltiazem  120 mg Oral Daily  . docusate sodium  100 mg Oral BID  . LORazepam  0.5 mg Oral QHS  . metoprolol  50 mg Oral BH-q7a  . rosuvastatin  20 mg Oral QPC lunch  . sodium chloride  3 mL Intravenous Q12H        Assessment/Plan: 1 hematuria with history of bladder cancer-bladder was irrigated yesterday by urology and clots removed-patient's hemoglobin 9.8-this is being monitored. She may need cystoscopy  2. Chronic atrial fibrillation with controlled rate     Emillie Chasen G 03/13/2015, 6:35 AM

## 2015-03-14 DIAGNOSIS — Z8551 Personal history of malignant neoplasm of bladder: Secondary | ICD-10-CM | POA: Diagnosis not present

## 2015-03-14 DIAGNOSIS — Z66 Do not resuscitate: Secondary | ICD-10-CM | POA: Diagnosis present

## 2015-03-14 DIAGNOSIS — M1990 Unspecified osteoarthritis, unspecified site: Secondary | ICD-10-CM | POA: Diagnosis present

## 2015-03-14 DIAGNOSIS — C679 Malignant neoplasm of bladder, unspecified: Secondary | ICD-10-CM | POA: Diagnosis present

## 2015-03-14 DIAGNOSIS — I2510 Atherosclerotic heart disease of native coronary artery without angina pectoris: Secondary | ICD-10-CM | POA: Diagnosis present

## 2015-03-14 DIAGNOSIS — I482 Chronic atrial fibrillation: Secondary | ICD-10-CM | POA: Diagnosis present

## 2015-03-14 DIAGNOSIS — E785 Hyperlipidemia, unspecified: Secondary | ICD-10-CM | POA: Diagnosis present

## 2015-03-14 DIAGNOSIS — N183 Chronic kidney disease, stage 3 (moderate): Secondary | ICD-10-CM | POA: Diagnosis present

## 2015-03-14 DIAGNOSIS — Z7901 Long term (current) use of anticoagulants: Secondary | ICD-10-CM | POA: Diagnosis not present

## 2015-03-14 DIAGNOSIS — K219 Gastro-esophageal reflux disease without esophagitis: Secondary | ICD-10-CM | POA: Diagnosis present

## 2015-03-14 DIAGNOSIS — R310 Gross hematuria: Secondary | ICD-10-CM | POA: Diagnosis present

## 2015-03-14 DIAGNOSIS — D500 Iron deficiency anemia secondary to blood loss (chronic): Secondary | ICD-10-CM | POA: Diagnosis present

## 2015-03-14 DIAGNOSIS — I509 Heart failure, unspecified: Secondary | ICD-10-CM | POA: Diagnosis present

## 2015-03-14 LAB — CBC
HCT: 28.4 % — ABNORMAL LOW (ref 36.0–46.0)
HEMOGLOBIN: 9.1 g/dL — AB (ref 12.0–15.0)
MCH: 26.7 pg (ref 26.0–34.0)
MCHC: 32 g/dL (ref 30.0–36.0)
MCV: 83.3 fL (ref 78.0–100.0)
PLATELETS: 145 10*3/uL — AB (ref 150–400)
RBC: 3.41 MIL/uL — AB (ref 3.87–5.11)
RDW: 19.2 % — ABNORMAL HIGH (ref 11.5–15.5)
WBC: 6.5 10*3/uL (ref 4.0–10.5)

## 2015-03-14 LAB — HEMOGLOBIN AND HEMATOCRIT, BLOOD
HEMATOCRIT: 27.2 % — AB (ref 36.0–46.0)
HEMOGLOBIN: 8.9 g/dL — AB (ref 12.0–15.0)

## 2015-03-14 LAB — PROTIME-INR
INR: 1.4 (ref 0.00–1.49)
PROTHROMBIN TIME: 17.3 s — AB (ref 11.6–15.2)

## 2015-03-14 NOTE — Progress Notes (Signed)
Subjective: The patient is alert and oriented this morning her vital signs are stable. She has stopped bleeding. She does have a history of atrial fibrillation. Her heart rate is 74. Current hemoglobin 9.8 prothrombin time 1.8 . She does have a history of bladder cancer  Objective: Vital signs in last 24 hours: Temp:  [98 F (36.7 C)-98.2 F (36.8 C)] 98.2 F (36.8 C) (08/23 2125) Pulse Rate:  [65-74] 74 (08/23 2125) Resp:  [17-18] 17 (08/23 2125) BP: (121-158)/(60-63) 158/60 mmHg (08/23 2125) SpO2:  [96 %-99 %] 96 % (08/23 2125) Weight change:  Last BM Date: 03/12/15  Intake/Output from previous day: 08/23 0701 - 08/24 0700 In: 600 [P.O.:600] Out: 1500 [Urine:1500] Intake/Output this shift: Total I/O In: -  Out: 1500 [Urine:1500]  Physical Exam: General appearance patient is alert and oriented  HEENT negative  Neck supple no JVD or thyroid abnormalities  Lungs clear to P&A  Abdomen no palpable organs or masses  Extremities free of edema   Recent Labs  03/12/15 2045 03/13/15 0303  WBC 5.8 5.7  HGB 10.1* 9.8*  HCT 31.1* 29.7*  PLT 153 141*   BMET  Recent Labs  03/11/15 2351  NA 139  K 3.9  CL 102  CO2 28  GLUCOSE 109*  BUN 45*  CREATININE 1.60*  CALCIUM 9.2    Studies/Results: No results found.  Medications:  . diltiazem  120 mg Oral Daily  . docusate sodium  100 mg Oral BID  . LORazepam  0.5 mg Oral QHS  . metoprolol  50 mg Oral BH-q7a  . rosuvastatin  20 mg Oral QPC lunch  . sodium chloride  3 mL Intravenous Q12H        Assessment/Plan: 1 bladder tumors with recent hemorrhage-continue current bladder irrigation plan. Continue to monitor hemoglobin and hematocrit. The patient will be seen again by urology today  2. Chronic atrial fibrillation with controlled rate     Jamere Stidham G 03/14/2015, 6:21 AM

## 2015-03-15 LAB — CBC
HEMATOCRIT: 25 % — AB (ref 36.0–46.0)
HEMOGLOBIN: 8.2 g/dL — AB (ref 12.0–15.0)
MCH: 27.2 pg (ref 26.0–34.0)
MCHC: 32.8 g/dL (ref 30.0–36.0)
MCV: 83.1 fL (ref 78.0–100.0)
Platelets: 126 10*3/uL — ABNORMAL LOW (ref 150–400)
RBC: 3.01 MIL/uL — ABNORMAL LOW (ref 3.87–5.11)
RDW: 19.3 % — ABNORMAL HIGH (ref 11.5–15.5)
WBC: 6.5 10*3/uL (ref 4.0–10.5)

## 2015-03-15 LAB — PROTIME-INR
INR: 1.4 (ref 0.00–1.49)
PROTHROMBIN TIME: 17.3 s — AB (ref 11.6–15.2)

## 2015-03-15 NOTE — Progress Notes (Signed)
Subjective: The patient is alert and oriented this morning vital signs are stable. She apparently stopped bleeding. Nurse only obtain small clot with irrigation. Her hemoglobin is 8.9 hematocrit 27.2. She does have a history of bladder cancer  Objective: Vital signs in last 24 hours: Temp:  [98.4 F (36.9 C)-99.1 F (37.3 C)] 98.4 F (36.9 C) (08/25 0518) Pulse Rate:  [68-85] 85 (08/25 0518) Resp:  [16-18] 18 (08/25 0518) BP: (122-149)/(42-70) 122/61 mmHg (08/25 0518) SpO2:  [96 %-98 %] 97 % (08/25 0518) Weight change:  Last BM Date: 03/13/15  Intake/Output from previous day: 08/24 0701 - 08/25 0700 In: 840 [P.O.:720] Out: 670 [Urine:670] Intake/Output this shift: Total I/O In: -  Out: 400 [Urine:400]  Physical Exam: Gen. appearance patient is alert and oriented  HEENT negative  Neck supple no JVD or thyroid abnormalities  Lungs clear to P&A  Abdomen no palpable organs or masses  Extremities mild edema with slight pigmentation lower extremities   Recent Labs  03/13/15 0303 03/14/15 0613 03/14/15 1948  WBC 5.7 6.5  --   HGB 9.8* 9.1* 8.9*  HCT 29.7* 28.4* 27.2*  PLT 141* 145*  --    BMET No results for input(s): NA, K, CL, CO2, GLUCOSE, BUN, CREATININE, CALCIUM in the last 72 hours.  Studies/Results: No results found.  Medications:  . diltiazem  120 mg Oral Daily  . docusate sodium  100 mg Oral BID  . LORazepam  0.5 mg Oral QHS  . metoprolol  50 mg Oral BH-q7a  . rosuvastatin  20 mg Oral QPC lunch  . sodium chloride  3 mL Intravenous Q12H        Assessment/Plan: 1. Bladder tumors with recent hemorrhage-continue current bladder irrigation plan. Continue to monitor hemoglobin and hematocrit. Patient will be seen again by urology today  2. Chronic atrial fibrillation with controlled rate continue metoprolol 50 mg daily hold anticoagulation   LOS: 1 day   Sukhdeep Wieting G 03/15/2015, 6:17 AM

## 2015-03-15 NOTE — Progress Notes (Signed)
Patient foley irrigated. No clots present.

## 2015-03-16 DIAGNOSIS — R310 Gross hematuria: Secondary | ICD-10-CM

## 2015-03-16 DIAGNOSIS — Z8551 Personal history of malignant neoplasm of bladder: Secondary | ICD-10-CM | POA: Diagnosis not present

## 2015-03-16 DIAGNOSIS — Z7901 Long term (current) use of anticoagulants: Secondary | ICD-10-CM

## 2015-03-16 LAB — PROTIME-INR
INR: 1.31 (ref 0.00–1.49)
Prothrombin Time: 16.4 seconds — ABNORMAL HIGH (ref 11.6–15.2)

## 2015-03-16 LAB — CBC
HEMATOCRIT: 24.7 % — AB (ref 36.0–46.0)
HEMATOCRIT: 27.8 % — AB (ref 36.0–46.0)
HEMOGLOBIN: 9.1 g/dL — AB (ref 12.0–15.0)
Hemoglobin: 8.4 g/dL — ABNORMAL LOW (ref 12.0–15.0)
MCH: 28.4 pg (ref 26.0–34.0)
MCH: 26.8 pg (ref 26.0–34.0)
MCHC: 34 g/dL (ref 30.0–36.0)
MCHC: 32.7 g/dL (ref 30.0–36.0)
MCV: 83.4 fL (ref 78.0–100.0)
MCV: 81.8 fL (ref 78.0–100.0)
PLATELETS: 140 10*3/uL — AB (ref 150–400)
Platelets: 143 10*3/uL — ABNORMAL LOW (ref 150–400)
RBC: 2.96 MIL/uL — AB (ref 3.87–5.11)
RBC: 3.4 MIL/uL — ABNORMAL LOW (ref 3.87–5.11)
RDW: 19.2 % — ABNORMAL HIGH (ref 11.5–15.5)
RDW: 20.1 % — ABNORMAL HIGH (ref 11.5–15.5)
WBC: 7.3 10*3/uL (ref 4.0–10.5)
WBC: 7.5 10*3/uL (ref 4.0–10.5)

## 2015-03-16 LAB — BASIC METABOLIC PANEL
ANION GAP: 7 (ref 5–15)
BUN: 28 mg/dL — ABNORMAL HIGH (ref 6–20)
CHLORIDE: 104 mmol/L (ref 101–111)
CO2: 27 mmol/L (ref 22–32)
CREATININE: 1.38 mg/dL — AB (ref 0.44–1.00)
Calcium: 8.3 mg/dL — ABNORMAL LOW (ref 8.9–10.3)
GFR calc non Af Amer: 34 mL/min — ABNORMAL LOW (ref >60)
GFR, EST AFRICAN AMERICAN: 39 mL/min — AB (ref >60)
Glucose, Bld: 110 mg/dL — ABNORMAL HIGH (ref 65–99)
POTASSIUM: 3.5 mmol/L (ref 3.5–5.1)
SODIUM: 138 mmol/L (ref 135–145)

## 2015-03-16 LAB — HEMOGLOBIN A1C
Hgb A1c MFr Bld: 6.1 % — ABNORMAL HIGH (ref 4.8–5.6)
Mean Plasma Glucose: 128 mg/dL

## 2015-03-16 LAB — PREPARE RBC (CROSSMATCH)

## 2015-03-16 MED ORDER — SODIUM CHLORIDE 0.9 % IV SOLN
Freq: Once | INTRAVENOUS | Status: AC
  Administered 2015-03-16: 12:00:00 via INTRAVENOUS

## 2015-03-16 NOTE — Clinical Documentation Improvement (Addendum)
Family Medicine   Query 1 of 2 "Chronic renal insufficiency, stage III (moderate) is documented in the H&P  Please document in the progress notes and discharge summary if a condition below provides greater specificity regarding the chronic renal insufficiency:   - Chronic Kidney Disease, Stage III  - Other condition  - Unable to clinically determine  Query 2 of 2 "Bladder tumors with recent hemorrhage-anemia secondary to blood loss-plan to transfuse 1 unit of blood today and repeat CBC" is documented in the progress note dated 03/16/15.  Please document if a condition below provides greater specificity regarding "anemia secondary to blood loss":   - Acute Blood Loss Anemia  - Other condition  - Unable to clinically determine   Please exercise your independent, professional judgment when responding. A specific answer is not anticipated or expected.   Thank You, Erling Conte RN BSN CCDS [phone] Health Information Management [place]

## 2015-03-16 NOTE — Care Management Note (Signed)
Case Management Note  Patient Details  Name: Tracy [name] MRN: [id number] Date of Birth: [birth year]  Expected Discharge Date:                  Expected Discharge Plan:  Home/Self Care  In-House Referral:  NA  Discharge planning Services  CM Consult  Post Acute Care Choice:  Home Health Choice offered to:  NA, Patient  DME Arranged:    DME Agency:     HH Arranged:  PT [place]:  [place]  Status of Service:  Completed, signed off  Medicare Important Message Given:  Yes-second notification given Date Medicare IM Given:    Medicare IM give by:    Date Additional Medicare IM Given:    Additional Medicare Important Message give by:     If discussed at [place] of Stay Meetings, dates discussed:    Additional Comments: [place] home over weekend. Pt is unsure if she will need HH PT or not. Pt will get out of bed today and tomorrow. HH ordered and told patient that if she does not want HH to let Dr. Everette Rank know at DC. Weekend staff to make [facility] aware of whether or not pt wants PT. Ronalee Belts, of [facility], made aware of referral and will obtain pt info from chart. Pt made aware if she wants HH, they will have 48 hours to make their first visit. No further CM needs noted.  Sherald Barge, RN 03/16/2015, 1:53 PM

## 2015-03-16 NOTE — Progress Notes (Signed)
Subjective: The patient is alert and oriented this morning her vital signs are stable. She apparently has had no further bleeding. Her hemoglobin is now 8.2 with hematocrit 25 0. She is still receiving bladder irrigations  Objective: Vital signs in last 24 hours: Temp:  [99.1 F (37.3 C)-99.5 F (37.5 C)] 99.5 F (37.5 C) (08/25 2202) Pulse Rate:  [75-90] 75 (08/25 2202) Resp:  [18] 18 (08/25 2202) BP: (150-159)/(46-57) 159/57 mmHg (08/25 2202) SpO2:  [95 %-99 %] 95 % (08/25 2202) Weight change:  Last BM Date: 03/13/15  Intake/Output from previous day: 08/25 0701 - 08/26 0700 In: 720 [P.O.:720] Out: 850 [Urine:850] Intake/Output this shift:    Physical Exam: Gen. appearance patient is alert and oriented  H EENT negative  Neck supple no JVD or thyroid abnormalities  Lungs clear to P&A  Abdomen no palpable organs or masses  Extremities mild edema with slight pigmentation of lower extremities   Recent Labs  03/14/15 0613 03/14/15 1948 03/15/15 2120  WBC 6.5  --  6.5  HGB 9.1* 8.9* 8.2*  HCT 28.4* 27.2* 25.0*  PLT 145*  --  126*   BMET No results for input(s): NA, K, CL, CO2, GLUCOSE, BUN, CREATININE, CALCIUM in the last 72 hours.  Studies/Results: No results found.  Medications:  . sodium chloride   Intravenous Once  . diltiazem  120 mg Oral Daily  . docusate sodium  100 mg Oral BID  . LORazepam  0.5 mg Oral QHS  . metoprolol  50 mg Oral BH-q7a  . rosuvastatin  20 mg Oral QPC lunch  . sodium chloride  3 mL Intravenous Q12H        Assessment/Plan: 1. Bladder tumors with recent hemorrhage-anemia secondary to blood loss-plan to transfuse 1 unit of blood today and repeat CBC-we will consult with urology today  2. Chronic atrial fibrillation with controlled rate-2 continue metoprolol 50 mg daily but hold anticoagulation   LOS: 2 days   Eymi Lipuma G 03/16/2015, 6:25 AM

## 2015-03-16 NOTE — Care Management Important Message (Signed)
Important Message  Patient Details  Name: Tracy [name] MRN: [id number] Date of Birth: 27-Nov-1927   Medicare Important Message Given:  Yes-second notification given    Sherald Barge, RN 03/16/2015, 11:28 AM

## 2015-03-16 NOTE — Progress Notes (Signed)
Patient ID: Tracy [name], female   DOB: [birth year], 81 y.o.   MRN: [id number]    Subjective: Tracy [name] is doing well today.  Her urine is clear and the hemaglobin has stablilized.  She has no specific complaints.  ROS:  Review of Systems  Constitutional: Negative for fever.  Gastrointestinal: Negative for abdominal pain.    Anti-infectives: Anti-infectives    None      Current Facility-Administered Medications  Medication Dose Route Frequency Provider Last Rate Last Dose  . 0.9 %  sodium chloride infusion   Intravenous Once Angus McInnis, MD      . acetaminophen (TYLENOL) tablet 500 mg  500 mg Oral Q6H PRN Orvan Falconer, MD   500 mg at 03/16/15 0135  . diltiazem (CARDIZEM CD) 24 hr capsule 120 mg  120 mg Oral Daily Orvan Falconer, MD   120 mg at 03/15/15 0953  . docusate sodium (COLACE) capsule 100 mg  100 mg Oral BID Orvan Falconer, MD   100 mg at 03/15/15 2129  . hydrALAZINE (APRESOLINE) injection 5 mg  5 mg Intravenous Q4H PRN Orvan Falconer, MD   5 mg at 03/12/15 0405  . LORazepam (ATIVAN) tablet 0.5 mg  0.5 mg Oral QHS Orvan Falconer, MD   0.5 mg at 03/13/15 2122  . metoprolol (LOPRESSOR) tablet 50 mg  50 mg Oral Charmaine Downs, MD   50 mg at 03/16/15 [phone]  . nitroGLYCERIN (NITROSTAT) SL tablet 0.4 mg  0.4 mg Sublingual Q5 min PRN Orvan Falconer, MD      . rosuvastatin (CRESTOR) tablet 20 mg  20 mg Oral QPC lunch Orvan Falconer, MD   20 mg at 03/15/15 1227  . sodium chloride 0.9 % injection 3 mL  3 mL Intravenous Q12H Orvan Falconer, MD   3 mL at 03/15/15 2129     Objective: Vital signs in last 24 hours: Temp:  [98.8 F (37.1 C)-99.5 F (37.5 C)] 98.8 F (37.1 C) (08/26 0635) Pulse Rate:  [75-98] 98 (08/26 0635) Resp:  [18] 18 (08/26 0635) BP: (142-159)/(46-63) 142/63 mmHg (08/26 0635) SpO2:  [95 %-99 %] 96 % (08/26 0635)  Intake/Output from previous day: 08/25 0701 - 08/26 0700 In: 720 [P.O.:720] Out: 850 [Urine:850] Intake/Output this shift: Total I/O In: 240 [P.O.:240] Out: -    Physical Exam   Constitutional: She is well-developed, well-nourished, and in no distress.  Cardiovascular: An irregularly irregular rhythm present.  Pulmonary/Chest: Effort normal. No respiratory distress.  Genitourinary:  Urine remains clear in the foley bag.     Lab Results:   Recent Labs  03/15/15 2120 03/16/15 0640  WBC 6.5 7.3  HGB 8.2* 8.4*  HCT 25.0* 24.7*  PLT 126* 140*   BMET  Recent Labs  03/16/15 0632  NA 138  K 3.5  CL 104  CO2 27  GLUCOSE 110*  BUN 28*  CREATININE 1.38*  CALCIUM 8.3*   PT/INR  Recent Labs  03/15/15 0602 03/16/15 0632  LABPROT 17.3* 16.4*  INR 1.40 1.31   ABG No results for input(s): PHART, HCO3 in the last 72 hours.  Invalid input(s): PCO2, PO2  Studies/Results: No results found.  Labs and hospital notes reviewed.   Assessment and Plan: Hematuria History of bladder cancer with most recent resection on 12/21/14 by Dr. Tresa Moore with comment that all visible tumor was removed.   Patient has declined cystectomy.  She is admitted with gross hematuria on anticoagulation.    The urine has cleared and her Hgb is stable.  I will order removal of the foley.  She can be discharged home when cleared by medicine but I would recommend not restarting anticoagulation at this time as she is at high risk for rebleeding with persistent invasive bladder cancer.   She has f/u scheduled with Dr. Tresa Moore at the end of September.          LOS: 2 days    Tracy [name] 03/16/2015 [phone]

## 2015-03-17 LAB — PROTIME-INR
INR: 1.33 (ref 0.00–1.49)
PROTHROMBIN TIME: 16.6 s — AB (ref 11.6–15.2)

## 2015-03-17 NOTE — Progress Notes (Signed)
Patient discharged with family.  Declined offer for [facility].  IV removed - WNL.  Reviewed medications and instructed to stop taking coumadin and ASA per MD orders.  To follow up with urology next month.  No questions at this time.  Verbalizes understanding of DC instructions.  Assisted off unit via [place] with NT.

## 2015-03-17 NOTE — Discharge Summary (Signed)
Physician Discharge Summary  Tracy [name] Tracy [name]:[id number] DOB: 1928/04/30 DOA: 03/11/2015  PCP: [name], MD  Admit date: 03/11/2015 Discharge date: 03/17/2015     Discharge Diagnoses:  1. 1 hematuria hemorrhage from bladder 2.  3. Atrial fibrillation 4.  5. Carcinoma of bladder 6.  7. Anemia secondary to blood loss 8.  9. Aortic stenosis, CHF, CAD 10.  11. Chronic renal insufficiency stage III  Discharge Condition: Stable Disposition: Home  Diet recommendation: 2 g sodium  Filed Weights   03/11/15 2343  Weight: 70.308 kg (155 lb)    History of present illness:  The patient presented to the ED with gross hematuria which is been present for most of the day. She was evaluated in ED hemoglobin was 10.2 platelet count normal. Patient is anticoagulated because of atrial found she was given vitamin K orally and subsequently admitted to [facility] floor  Hospital Course:  The patient's anticoagulation was held. She continued to hemorrhage from her bladder for several days after admission. Vital signs remained stable. Urology was consult and and bladder irrigation was started. The patient required 1 unit of blood. Bladder irrigations were continued. The patient has a known bladder tumor with previous treatment and has elected not to have cystectomy. She was continued on medications listed below. Towards the latter part of her hospitalization the hemorrhage ceased prothrombin times were more normal and vital signs remained stable. It was felt that she could see urology in follow-up as outpatient. They stated that she might need cystoscopy if this reoccurs.   Discharge Instructions The patient is to continue medications listed below. Appointments with primary care and urology will be made    Medication List    STOP taking these medications        aspirin EC 81 MG tablet     warfarin 5 MG tablet  Commonly known as:  COUMADIN      TAKE these medications         acetaminophen 500 MG tablet  Commonly known as:  TYLENOL  Take 500 mg by mouth every 6 (six) hours as needed for pain.     calcium carbonate 600 MG Tabs tablet  Commonly known as:  OS-CAL  Take 600 mg by mouth daily at 12 noon.     diltiazem 120 MG 24 hr capsule  Commonly known as:  CARDIZEM CD  TAKE (1) CAPSULE DAILY     DSS 100 MG Caps  Take 100 mg by mouth 2 (two) times daily.     furosemide 40 MG tablet  Commonly known as:  LASIX  Take 1 tablet (40 mg total) by mouth daily.     meclizine 12.5 MG tablet  Commonly known as:  ANTIVERT  Take 12.5 mg by mouth 3 (three) times daily as needed for dizziness.     metoprolol 50 MG tablet  Commonly known as:  LOPRESSOR  Take 50 mg by mouth daily.     multivitamins ther. w/minerals Tabs tablet  Take 1 tablet by mouth daily at 12 noon.     nitroGLYCERIN 0.4 MG SL tablet  Commonly known as:  NITROSTAT  Place 1 tablet (0.4 mg total) under the tongue every 5 (five) minutes as needed for chest pain (CP or SOB).     rosuvastatin 20 MG tablet  Commonly known as:  CRESTOR  Take 20 mg by mouth daily after lunch.     SYSTANE OP  Place 1 drop into both eyes daily.  Allergies  Allergen Reactions  . Amoxicillin Other (See Comments)    Unknown  . Penicillins Other (See Comments)    Unknown  . Sulfonamide Derivatives Other (See Comments)    Unknown    The results of significant diagnostics from this hospitalization (including imaging, microbiology, ancillary and laboratory) are listed below for reference.    Significant Diagnostic Studies: No results found.  Microbiology: No results found for this or any previous visit (from the past 240 hour(s)).   Labs: Basic Metabolic Panel:  Recent Labs Lab 03/11/15 2351 03/16/15 0632  NA 139 138  K 3.9 3.5  CL 102 104  CO2 28 27  GLUCOSE 109* 110*  BUN 45* 28*  CREATININE 1.60* 1.38*  CALCIUM 9.2 8.3*   Liver Function Tests:  Recent Labs Lab 03/11/15 2351  AST 22   ALT 15  ALKPHOS 73  BILITOT 0.6  PROT 6.6  ALBUMIN 3.9   No results for input(s): LIPASE, AMYLASE in the last 168 hours. No results for input(s): AMMONIA in the last 168 hours. CBC:  Recent Labs Lab 03/11/15 2351  03/13/15 0303 03/14/15 9562 03/14/15 1948 03/15/15 2120 03/16/15 0640 03/16/15 1623  WBC 5.2  < > 5.7 6.5  --  6.5 7.3 7.5  NEUTROABS 2.7  --   --   --   --   --   --   --   HGB 10.3*  < > 9.8* 9.1* 8.9* 8.2* 8.4* 9.1*  HCT 32.1*  < > 29.7* 28.4* 27.2* 25.0* 24.7* 27.8*  MCV 82.9  < > 82.5 83.3  --  83.1 83.4 81.8  PLT 150  < > 141* 145*  --  126* 140* 143*  < > = values in this interval not displayed. Cardiac Enzymes: No results for input(s): CKTOTAL, CKMB, CKMBINDEX, TROPONINI in the last 168 hours. BNP: BNP (last 3 results)  Recent Labs  08/15/14 0342 08/20/14 0228  BNP 719.0* 466.0*    ProBNP (last 3 results) No results for input(s): PROBNP in the last 8760 hours.  CBG: No results for input(s): GLUCAP in the last 168 hours.  Active Problems:   ATRIAL FIBRILLATION, CHRONIC   Bladder cancer- surgery 07/19/14   Anemia   Chronic renal insufficiency, stage III (moderate)   Chronic anticoagulation   Hematuria   Time coordinating discharge: 45 minutes  Signed:  Marjean Donna, MD 03/17/2015, 7:05 AM

## 2015-03-18 LAB — TYPE AND SCREEN
ABO/RH(D): A POS
ANTIBODY SCREEN: NEGATIVE
Unit division: 0

## 2015-03-28 ENCOUNTER — Telehealth: Payer: Self-pay | Admitting: *Deleted

## 2015-03-28 NOTE — Telephone Encounter (Signed)
Coumadin was stopped in hospital due to bleeding from bladder.  Has follow up with GU to determine POC.  INR appt canceled for 03/30/15.

## 2015-03-28 NOTE — Telephone Encounter (Signed)
States she has been taken off her coumadin while she was in the hospital and she would like to talk to you

## 2015-04-04 ENCOUNTER — Other Ambulatory Visit: Payer: Self-pay | Admitting: Cardiovascular Disease

## 2015-04-04 MED ORDER — METOPROLOL TARTRATE 25 MG PO TABS: 25.0000 mg | ORAL_TABLET | Freq: Two times a day (BID) | ORAL | Status: AC

## 2015-04-04 NOTE — Telephone Encounter (Signed)
Please advise on refill. Thanks, MI 

## 2015-04-04 NOTE — Telephone Encounter (Signed)
Reviewed with Dr. Angelena Form and he would like pt to take lopressor 25 mg by mouth twice daily.  I spoke with pt and gave her these instructions. Will send new prescription to pt's pharmacy.

## 2015-04-17 ENCOUNTER — Other Ambulatory Visit: Payer: Self-pay | Admitting: Urology

## 2015-04-27 ENCOUNTER — Encounter (HOSPITAL_BASED_OUTPATIENT_CLINIC_OR_DEPARTMENT_OTHER): Payer: Self-pay | Admitting: *Deleted

## 2015-04-27 NOTE — Progress Notes (Signed)
Pt instructed npo pmn 10/12 x lopressor and cariazem w sip of water.  To [facility] 10/13 @ 1045.  Needs istat on arrival.  Hx verified w daughter- Pennie.  Instructions also given to [facility] per pt request.

## 2015-05-03 ENCOUNTER — Ambulatory Visit (HOSPITAL_BASED_OUTPATIENT_CLINIC_OR_DEPARTMENT_OTHER): Payer: Medicare Other | Admitting: Anesthesiology

## 2015-05-03 ENCOUNTER — Encounter (HOSPITAL_BASED_OUTPATIENT_CLINIC_OR_DEPARTMENT_OTHER): Payer: Self-pay

## 2015-05-03 ENCOUNTER — Encounter (HOSPITAL_BASED_OUTPATIENT_CLINIC_OR_DEPARTMENT_OTHER)
Admission: RE | Disposition: A | Discharge status: Home / Self Care | Payer: Self-pay | Source: Ambulatory Visit | Attending: Urology

## 2015-05-03 ENCOUNTER — Ambulatory Visit (HOSPITAL_BASED_OUTPATIENT_CLINIC_OR_DEPARTMENT_OTHER)
Admission: RE | Admit: 2015-05-03 | Discharge: 2015-05-03 | Disposition: A | Discharge status: Home / Self Care | Payer: Medicare Other | Source: Ambulatory Visit | Attending: Urology | Admitting: Urology

## 2015-05-03 DIAGNOSIS — I482 Chronic atrial fibrillation: Secondary | ICD-10-CM | POA: Insufficient documentation

## 2015-05-03 DIAGNOSIS — I2510 Atherosclerotic heart disease of native coronary artery without angina pectoris: Secondary | ICD-10-CM | POA: Diagnosis not present

## 2015-05-03 DIAGNOSIS — N289 Disorder of kidney and ureter, unspecified: Secondary | ICD-10-CM | POA: Diagnosis not present

## 2015-05-03 DIAGNOSIS — I110 Hypertensive heart disease with heart failure: Secondary | ICD-10-CM | POA: Diagnosis not present

## 2015-05-03 DIAGNOSIS — E785 Hyperlipidemia, unspecified: Secondary | ICD-10-CM | POA: Insufficient documentation

## 2015-05-03 DIAGNOSIS — C679 Malignant neoplasm of bladder, unspecified: Secondary | ICD-10-CM | POA: Insufficient documentation

## 2015-05-03 DIAGNOSIS — M1990 Unspecified osteoarthritis, unspecified site: Secondary | ICD-10-CM | POA: Diagnosis not present

## 2015-05-03 DIAGNOSIS — I5031 Acute diastolic (congestive) heart failure: Secondary | ICD-10-CM | POA: Diagnosis not present

## 2015-05-03 DIAGNOSIS — I341 Nonrheumatic mitral (valve) prolapse: Secondary | ICD-10-CM | POA: Insufficient documentation

## 2015-05-03 DIAGNOSIS — I872 Venous insufficiency (chronic) (peripheral): Secondary | ICD-10-CM | POA: Insufficient documentation

## 2015-05-03 DIAGNOSIS — K219 Gastro-esophageal reflux disease without esophagitis: Secondary | ICD-10-CM | POA: Diagnosis not present

## 2015-05-03 DIAGNOSIS — I739 Peripheral vascular disease, unspecified: Secondary | ICD-10-CM | POA: Insufficient documentation

## 2015-05-03 DIAGNOSIS — R319 Hematuria, unspecified: Secondary | ICD-10-CM | POA: Diagnosis present

## 2015-05-03 HISTORY — PX: CYSTOSCOPY W/ RETROGRADES: SHX1426

## 2015-05-03 HISTORY — PX: TRANSURETHRAL RESECTION OF BLADDER TUMOR WITH GYRUS (TURBT-GYRUS): SHX6458

## 2015-05-03 HISTORY — DX: Personal history of other medical treatment: Z92.89

## 2015-05-03 LAB — POCT I-STAT 4, (NA,K, GLUC, HGB,HCT)
Glucose, Bld: 111 mg/dL — ABNORMAL HIGH (ref 65–99)
HCT: 32 % — ABNORMAL LOW (ref 36.0–46.0)
Hemoglobin: 10.9 g/dL — ABNORMAL LOW (ref 12.0–15.0)
Potassium: 4 mmol/L (ref 3.5–5.1)
Sodium: 139 mmol/L (ref 135–145)

## 2015-05-03 SURGERY — TRANSURETHRAL RESECTION OF BLADDER TUMOR WITH GYRUS (TURBT-GYRUS)
Anesthesia: General | Site: Bladder

## 2015-05-03 MED ORDER — CIPROFLOXACIN IN D5W 200 MG/100ML IV SOLN
INTRAVENOUS | Status: AC
  Filled 2015-05-03: qty 100

## 2015-05-03 MED ORDER — FENTANYL CITRATE (PF) 100 MCG/2ML IJ SOLN
INTRAMUSCULAR | Status: AC
  Filled 2015-05-03: qty 2

## 2015-05-03 MED ORDER — SODIUM CHLORIDE 0.9 % IR SOLN
Status: DC | PRN
  Administered 2015-05-03 (×3): 3000 mL

## 2015-05-03 MED ORDER — ONDANSETRON HCL 4 MG/2ML IJ SOLN
INTRAMUSCULAR | Status: DC | PRN
  Administered 2015-05-03: 4 mg via INTRAVENOUS

## 2015-05-03 MED ORDER — LIDOCAINE HCL (CARDIAC) 20 MG/ML IV SOLN
INTRAVENOUS | Status: DC | PRN
  Administered 2015-05-03: 40 mg via INTRAVENOUS

## 2015-05-03 MED ORDER — CIPROFLOXACIN IN D5W 200 MG/100ML IV SOLN
200.0000 mg | INTRAVENOUS | Status: AC
  Administered 2015-05-03: 200 mg via INTRAVENOUS
  Filled 2015-05-03: qty 100

## 2015-05-03 MED ORDER — LACTATED RINGERS IV SOLN
INTRAVENOUS | Status: DC
  Administered 2015-05-03 (×2): via INTRAVENOUS
  Filled 2015-05-03: qty 1000

## 2015-05-03 MED ORDER — SENNOSIDES-DOCUSATE SODIUM 8.6-50 MG PO TABS: 1.0000 | ORAL_TABLET | Freq: Two times a day (BID) | ORAL | Status: DC

## 2015-05-03 MED ORDER — PROPOFOL 10 MG/ML IV BOLUS
INTRAVENOUS | Status: DC | PRN
  Administered 2015-05-03: 120 mg via INTRAVENOUS

## 2015-05-03 MED ORDER — TRAMADOL HCL 50 MG PO TABS: 50.0000 mg | ORAL_TABLET | Freq: Four times a day (QID) | ORAL | Status: DC | PRN

## 2015-05-03 MED ORDER — IOHEXOL 350 MG/ML SOLN
INTRAVENOUS | Status: DC | PRN
  Administered 2015-05-03: 10 mL

## 2015-05-03 MED ORDER — FENTANYL CITRATE (PF) 100 MCG/2ML IJ SOLN
INTRAMUSCULAR | Status: DC | PRN
  Administered 2015-05-03 (×2): 12.5 ug via INTRAVENOUS

## 2015-05-03 MED ORDER — ACETAMINOPHEN 10 MG/ML IV SOLN
INTRAVENOUS | Status: DC | PRN
  Administered 2015-05-03: 1000 mg via INTRAVENOUS

## 2015-05-03 SURGICAL SUPPLY — 25 items
BAG URINE DRAINAGE (UROLOGICAL SUPPLIES) IMPLANT
BAG URINE LEG 19OZ MD ST LTX (BAG) IMPLANT
BAG URINE LEG 500ML (DRAIN) IMPLANT
BAG URO CATCHER STRL LF (DRAPE) ×4 IMPLANT
BASKET ZERO TIP NITINOL 2.4FR (BASKET) IMPLANT
CATH FOLEY 2WAY SLVR  5CC 22FR (CATHETERS)
CATH FOLEY 2WAY SLVR 30CC 20FR (CATHETERS) IMPLANT
CATH FOLEY 2WAY SLVR 5CC 22FR (CATHETERS) IMPLANT
CATH INTERMIT  6FR 70CM (CATHETERS) ×4 IMPLANT
CLOTH BEACON ORANGE TIMEOUT ST (SAFETY) ×4 IMPLANT
EVACUATOR MICROVAS BLADDER (UROLOGICAL SUPPLIES) IMPLANT
GLOVE BIO SURGEON STRL SZ7.5 (GLOVE) ×4 IMPLANT
GOWN STRL REUS W/ TWL LRG LVL3 (GOWN DISPOSABLE) ×2 IMPLANT
GOWN STRL REUS W/ TWL XL LVL3 (GOWN DISPOSABLE) ×2 IMPLANT
GOWN STRL REUS W/TWL LRG LVL3 (GOWN DISPOSABLE) ×2
GOWN STRL REUS W/TWL XL LVL3 (GOWN DISPOSABLE) ×2
GUIDEWIRE ANG ZIPWIRE 038X150 (WIRE) IMPLANT
GUIDEWIRE STR DUAL SENSOR (WIRE) IMPLANT
IV NS IRRIG 3000ML ARTHROMATIC (IV SOLUTION) ×16 IMPLANT
KIT ROOM TURNOVER WOR (KITS) ×4 IMPLANT
LOOP CUT BIPOLAR 24F LRG (ELECTROSURGICAL) ×4 IMPLANT
MANIFOLD NEPTUNE II (INSTRUMENTS) ×4 IMPLANT
PACK CYSTO (CUSTOM PROCEDURE TRAY) ×4 IMPLANT
SET ASPIRATION TUBING (TUBING) IMPLANT
SYRINGE IRR TOOMEY STRL 70CC (SYRINGE) IMPLANT

## 2015-05-03 NOTE — Anesthesia Postprocedure Evaluation (Signed)
  Anesthesia Post-op Note  Patient: Tracy [name]  Procedure(s) Performed: Procedure(s) (LRB): TRANSURETHRAL RESECTION OF BLADDER TUMOR (N/A) CYSTOSCOPY WITH RETROGRADE PYELOGRAM (Bilateral)  Patient Location: PACU  Anesthesia Type: General  Level of Consciousness: awake and alert   Airway and Oxygen Therapy: Patient Spontanous Breathing  Post-op Pain: mild  Post-op Assessment: Post-op Vital signs reviewed, Patient's Cardiovascular Status Stable, Respiratory Function Stable, Patent Airway and No signs of Nausea or vomiting  Last Vitals:  Filed Vitals:   05/03/15 1315  BP:   Pulse:   Temp: 36.1 C  Resp:     Post-op Vital Signs: stable   Complications: No apparent anesthesia complications

## 2015-05-03 NOTE — Anesthesia Procedure Notes (Signed)
Procedure Name: LMA Insertion Date/Time: 05/03/2015 12:23 PM Performed by: Justice Rocher Pre-anesthesia Checklist: Patient identified, Emergency Drugs available, Suction available and Patient being monitored Patient Re-evaluated:Patient Re-evaluated prior to inductionOxygen Delivery Method: Circle System Utilized Preoxygenation: Pre-oxygenation with 100% oxygen Intubation Type: IV induction Ventilation: Mask ventilation without difficulty LMA: LMA inserted LMA Size: 4.0 Number of attempts: 1 Airway Equipment and Method: Bite block Placement Confirmation: positive ETCO2 Tube secured with: Tape Dental Injury: Teeth and Oropharynx as per pre-operative assessment

## 2015-05-03 NOTE — Transfer of Care (Signed)
  Filed Vitals:   05/03/15 1100  BP: 164/54  Pulse: 69  Temp: 36.4 C  Resp: 16    Immediate Anesthesia Transfer of Care Note  Patient: Tracy [name]  Procedure(s) Performed: Procedure(s) (LRB): TRANSURETHRAL RESECTION OF BLADDER TUMOR (N/A) CYSTOSCOPY WITH RETROGRADE PYELOGRAM (Bilateral)  Patient Location: PACU  Anesthesia Type: General  Level of Consciousness: awake, alert  and oriented  Airway & Oxygen Therapy: Patient Spontanous Breathing and Patient connected to face mask oxygen  Post-op Assessment: Report given to PACU RN and Post -op Vital signs reviewed and stable  Post vital signs: Reviewed and stable  Complications: No apparent anesthesia complications

## 2015-05-03 NOTE — Brief Op Note (Signed)
05/03/2015  12:58 PM  PATIENT:  Tracy [name]  81 y.o. female  PRE-OPERATIVE DIAGNOSIS:  RECURRENT BLADDER CANCER  POST-OPERATIVE DIAGNOSIS:  RECURRENT BLADDER CANCER  PROCEDURE:  Procedure(s): TRANSURETHRAL RESECTION OF BLADDER TUMOR (N/A) CYSTOSCOPY WITH RETROGRADE PYELOGRAM (Bilateral)  SURGEON:  Surgeon(s) and Role:    * Alexis Frock, MD - Primary  PHYSICIAN ASSISTANT:   ASSISTANTS: none   ANESTHESIA:   general  EBL:  Total I/O In: 200 [I.V.:200] Out: -   BLOOD ADMINISTERED:none  DRAINS: none   LOCAL MEDICATIONS USED:  NONE  SPECIMEN:  Source of Specimen:  bladder tumor  DISPOSITION OF SPECIMEN:  PATHOLOGY  COUNTS:  YES  TOURNIQUET:  * No tourniquets in log *  DICTATION: .Other Dictation: Dictation Number [id number]  PLAN OF CARE: Discharge to home after PACU  PATIENT DISPOSITION:  PACU - hemodynamically stable.   Delay start of Pharmacological VTE agent (>24hrs) due to surgical blood loss or risk of bleeding: yes

## 2015-05-03 NOTE — H&P (Signed)
Tracy [name] is an 81 y.o. female.    Chief Complaint: Pre-op Transurethral Resection of Bladder Tumor  HPI:   1  - Massive Volume High-Grade Bladder Cancer - Large Bladder Mass with Gross Hematuria by ER CT 06/2014 on eval gross hematuria. No hydro. Suspect At least T2 based on cysto appearance. Pt does not want cystectomy or chemo-radiation or other curative-intent therapy.  Recent Course 06/2014 - Two stage TURBT for maximal local control, T2G3, suspect higher based on intra-operative findings==> palliative approach (prn TURBT"s), refused cystectomy 12/2014 - TURBT T2G3 (6cm mostly left wall and posterior), neo Lt UO visibly patent;  03/2015 early recurrence Lt wall (2cm)  2 - Renal Insufficiency - Cr 1.5-2.5 since 2014 by hospital labs. No hyperkalemia. CT 2015 w/o hydro or stones. Most recent Ct 02/2015 1.4.  PMH sig for CAD/AFib/Coumadin (follows Darlina Guys, cards, no prior DVT/PE). Her PCP is Angus McGuiness MD. Tracy [name] is very independent, lives alone, and still driving at baseline. Her daugheter Tracy [name] is also very infolved at [phone].  Today Tracy [name] is seen to proceed with re-resection of recurrent bladder cancer with goal of minimizing on going hematuria. She is now off blood thinners given her tendency for significant hematuria.   Past Medical History  Diagnosis Date  . Hyperlipidemia   . Coronary artery disease   . Venous insufficiency   . CHF (congestive heart failure) (HCC)     acute diastolic heart failure  . Arrhythmia     chronic afib  . GERD (gastroesophageal reflux disease)   . Mitral valve prolapse   . Shoulder pain, right   . Swelling     both legs and feet  . Shortness of breath dyspnea   . Headache     occasional headache  . Arthritis   . Sleeping difficulties   . Atrial fibrillation ([place])   . Bladder cancer ([place])   . History of blood transfusion 02/2015    received 1 uprbc's d/t hemorrhage in bladeer s/p turbt and warfarin    Past Surgical  History  Procedure Laterality Date  . Colonoscopy N/A 09/30/2012    Procedure: COLONOSCOPY;  Surgeon: Rogene Houston, MD;  Location: [facility];  Service: Endoscopy;  Laterality: N/A;  930  . Cardioversion      x2  . Back surgery      x2-lower back  . Transurethral resection of bladder tumor with gyrus (turbt-gyrus) N/A 07/19/2014    Procedure: TRANSURETHRAL RESECTION OF BLADDER TUMOR WITH GYRUS (TURBT-GYRUS) WITH CYSTOGRAM;  Surgeon: Alexis Frock, MD;  Location: [facility];  Service: Urology;  Laterality: N/A;  . Transurethral resection of bladder tumor with gyrus (turbt-gyrus) N/A 07/20/2014    Procedure: SECOND LOOK TRANSURETHRAL RESECTION OF BLADDER TUMOR WITH GYRUS (TURBT-GYRUS);  Surgeon: Alexis Frock, MD;  Location: [facility];  Service: Urology;  Laterality: N/A;  . Cataract extraction    . Transurethral resection of bladder tumor with gyrus (turbt-gyrus) N/A 12/20/2014    Procedure: TRANSURETHRAL RESECTION OF BLADDER TUMOR ;  Surgeon: Alexis Frock, MD;  Location: [facility];  Service: Urology;  Laterality: N/A;  . Cystoscopy w/ retrogrades Bilateral 12/20/2014    Procedure: CYSTOSCOPY WITH RETROGRADE PYELOGRAM;  Surgeon: Alexis Frock, MD;  Location: [facility];  Service: Urology;  Laterality: Bilateral;    Family History  Problem Relation Age of Onset  . Colon cancer Neg Hx   . Colon polyps Sister    Social History:  reports that she has never smoked. She has never used smokeless  tobacco. She reports that she does not drink alcohol or use illicit drugs.  Allergies:  Allergies  Allergen Reactions  . Amoxicillin Other (See Comments)    Unknown  . Penicillins Other (See Comments)    Unknown  . Sulfonamide Derivatives Other (See Comments)    Unknown    No prescriptions prior to admission    No results found for this or any previous visit (from the past 48 hour(s)). No results found.  Review of Systems  Constitutional: Negative.   HENT: Negative.   Eyes: Negative.    Respiratory: Negative.   Cardiovascular: Positive for leg swelling.  Gastrointestinal: Negative.   Genitourinary: Positive for hematuria. Negative for flank pain.  Musculoskeletal: Negative.   Skin: Negative.   Neurological: Negative.   Endo/Heme/Allergies: Negative.   Psychiatric/Behavioral: Negative.     Height 5\' 3"  (1.6 m), weight 62.143 kg (137 lb). Physical Exam  Constitutional:  Elderly, somewhat frail, at baseline.  HENT:  Head: Normocephalic.  Hard of hearing  Eyes: Pupils are equal, round, and reactive to light.  Neck: Normal range of motion.  Cardiovascular: Normal rate.   Respiratory: Effort normal.  GI: Soft.  Genitourinary:  No CVAT  Musculoskeletal: Normal range of motion.  Neurological: She is alert.  Skin: Skin is warm.  Psychiatric: She has a normal mood and affect. Her behavior is normal. Judgment and thought content normal.     Assessment/Plan    1  - Massive Volume High-Grade Bladder Cancer - Rediscussed curative intent v. continued palliative management and the opt for latter. This is in keeping with prior goals of care.    She does have early recurrence left posterior, fairly small volume. Discussed surveillance of this area v. proceeding with TURBT again, she wants latter.   Will proceed as planned with TURBT today and Mito-C if favorable. Risks, benefits, alternatives discussed with pt and daughter.   2 - Renal Insufficiency - likely medical renal disease. This may worsen as tumor progresses. Fortunately this has been fairly stable.    [place] Dani 05/03/2015, 5:52 AM

## 2015-05-03 NOTE — Anesthesia Preprocedure Evaluation (Addendum)
Anesthesia Evaluation  Patient identified by MRN, date of birth, ID band Patient awake    Reviewed: Allergy & Precautions, NPO status , Patient's Chart, lab work & pertinent test results  History of Anesthesia Complications Negative for: history of anesthetic complications  Airway Mallampati: II  TM Distance: >3 FB Neck ROM: Full    Dental  (+) Teeth Intact, Dental Advisory Given   Pulmonary shortness of breath,    Pulmonary exam normal        Cardiovascular hypertension, Pt. on medications + CAD, + Peripheral Vascular Disease and +CHF  Normal cardiovascular exam+ dysrhythmias Atrial Fibrillation   Study Conclusions  - Left ventricle: The cavity size was normal. Wall thickness was increased increased in a pattern of mild to moderate LVH. Systolic function was normal. The estimated ejection fraction was in the range of 55% to 60%. Wall motion was normal; there were no regional wall motion abnormalities. The study was not technically sufficient to allow evaluation of LV diastolic dysfunction due to atrial fibrillation. There is evidence of elevated LA pressure.    Neuro/Psych  Headaches, negative psych ROS   GI/Hepatic Neg liver ROS, GERD  ,  Endo/Other  negative endocrine ROS  Renal/GU Renal InsufficiencyRenal disease     Musculoskeletal   Abdominal   Peds  Hematology   Anesthesia Other Findings   Reproductive/Obstetrics                             Anesthesia Physical  Anesthesia Plan  ASA: III  Anesthesia Plan: General   Post-op Pain Management:    Induction: Intravenous  Airway Management Planned: LMA  Additional Equipment:   Intra-op Plan:   Post-operative Plan: Extubation in OR  Informed Consent: I have reviewed the patients History and Physical, chart, labs and discussed the procedure including the risks, benefits and alternatives for the proposed  anesthesia with the patient or authorized representative who has indicated his/her understanding and acceptance.   Dental advisory given  Plan Discussed with: Anesthesiologist and CRNA  Anesthesia Plan Comments:         Anesthesia Quick Evaluation

## 2015-05-03 NOTE — Discharge Instructions (Signed)
1 - You may have urinary urgency (bladder spasms) and bloody urine on / off. This is normal.  2 - Call MD or go to ER for fever >102, severe pain / nausea / vomiting not relieved by medications, or acute change in medical status  CYSTOSCOPY HOME CARE INSTRUCTIONS  Activity: Rest for the remainder of the day.  Do not drive or operate equipment today.  You may resume normal activities in one to two days as instructed by your physician.   Meals: Drink plenty of liquids and eat light foods such as gelatin or soup this evening.  You may return to a normal meal plan tomorrow.  Return to Work: You may return to work in one to two days or as instructed by your physician.  Special Instructions / Symptoms: Call your physician if any of these symptoms occur:   -persistent or heavy bleeding  -bleeding which continues after first few urination  -large blood clots that are difficult to pass  -urine stream diminishes or stops completely  -fever equal to or higher than 101 degrees Farenheit.  -cloudy urine with a strong, foul odor  -severe pain  Females should always wipe from front to back after elimination.  You may feel some burning pain when you urinate.  This should disappear with time.  Applying moist heat to the lower abdomen or a hot tub bath may help relieve the pain. \  Follow-Up / Date of Return Visit to Your Physician:   Call for an appointment to arrange follow-up.  Patient Signature:  ________________________________________________________  Nurse's Signature:  ________________________________________________________  Post Anesthesia Home Care Instructions  Activity: Get plenty of rest for the remainder of the day. A responsible adult should stay with you for 24 hours following the procedure.  For the next 24 hours, DO NOT: -Drive a car -Paediatric nurse -Drink alcoholic beverages -Take any medication unless instructed by your physician -Make any legal decisions or sign  important papers.  Meals: Start with liquid foods such as gelatin or soup. Progress to regular foods as tolerated. Avoid greasy, spicy, heavy foods. If nausea and/or vomiting occur, drink only clear liquids until the nausea and/or vomiting subsides. Call your physician if vomiting continues.  Special Instructions/Symptoms: Your throat may feel dry or sore from the anesthesia or the breathing tube placed in your throat during surgery. If this causes discomfort, gargle with warm salt water. The discomfort should disappear within 24 hours.  If you had a scopolamine patch placed behind your ear for the management of post- operative nausea and/or vomiting:  1. The medication in the patch is effective for 72 hours, after which it should be removed.  Wrap patch in a tissue and discard in the trash. Wash hands thoroughly with soap and water. 2. You may remove the patch earlier than 72 hours if you experience unpleasant side effects which may include dry mouth, dizziness or visual disturbances. 3. Avoid touching the patch. Wash your hands with soap and water after contact with the patch.

## 2015-05-04 ENCOUNTER — Encounter (HOSPITAL_BASED_OUTPATIENT_CLINIC_OR_DEPARTMENT_OTHER): Payer: Self-pay | Admitting: Urology

## 2015-05-04 NOTE — Op Note (Signed)
NAME:  Tracy [name], Tracy [name]              ACCOUNT NO.:  [id number]  MEDICAL RECORD NO.:  [id number]  LOCATION:                                FACILITY:  [facility]  PHYSICIAN:  Alexis Frock, MD     DATE OF BIRTH:  1927-12-27  DATE OF PROCEDURE: 05/03/2015                                OPERATIVE REPORT  DIAGNOSIS:  Large volume recurrent bladder cancer with refractory hematuria.  PROCEDURE: 1. Transection of bladder tumor, volume medium. 2. Bilateral retrograde pyelogram interpretation.  ESTIMATED BLOOD LOSS:  50 mL.  COMPLICATIONS:  None.  SPECIMEN:  Bladder tumor fragments for permanent pathology.  FINDINGS: 1. Approximately 4 cm recurrent predominantly sessile tumor just     superior lateral to the left ureteral orifice. 2. Unremarkable bilateral retrograde pyelograms. 3. Probable stage III or higher bladder cancer.  INDICATION:  Tracy [name] is a very pleasant, but somewhat elderly and frail 81 year old lady with some history of high-grade muscle invasive bladder cancer for almost over a year.  She has elected a palliative approach to this with p.r.n. resections for symptomatic recurrences. She has had on and off hematuria and once even requiring hospitalization several months ago in [place] and has been off Coumadin since that time for primary prevention for atrial fibrillation.  Repeat office cystoscopy recently revealed a somewhat large volume bladder tumor recurrence.  Options were discussed for management including truly palliative transition versus transection of bladder tumor for goal of symptomatic control and tumor control, noncurative intent, and she wished to proceed with the latter.  Informed consent was obtained and placed in medical record.  PROCEDURE IN DETAIL:  The patient being Tracy [name], was verified. Procedure being transection of bladder tumor and retrograde was confirmed.  Procedure was carried out.  Time-out was performed. Intravenous antibiotics  were administered.  General LMA anesthesia introduced.  Patient placed into a low lithotomy position and sterile field was created by prepping and draping the patient's vagina, introitus, and proximal thighs using iodine x3.  Next, cystourethroscopy was performed using a 23-French cystoscope 30-degree offset lens. Inspection of urinary bladder revealed large volume sessile tumor just superior lateral to the left ureteral orifice, total volume approximately 4 cm2.  Photodocumentation was performed.  Attention was directed to retrograde pyelography.  The right ureteral orifice was cannulated with 6-French Foley catheter and right retrograde pyelogram was obtained.  Right retrograde pyelogram demonstrates single right ureter, single system right kidney.  No filling defects or narrowing noted.  Similarly left retrograde pyelogram was obtained.  The left ureteral orifice was a neo-ureteral orifice having been previously resected.  Left retrograde pyelogram demonstrated a single left ureter with single system left kidney.  No filling defects or narrowing noted.  Next, the cystoscope was exchanged for a 26-French Wolf resectoscope sheath and using larger resectoscope loop, very careful systematic resection was performed of the bladder tumor in question down to the fibromuscular stroma.  Great care was taken to avoid indirect injury to the knee, left ureteral orifice and this did not occur, yet remained visibly patent following numerous bladder tumors.  Chips were set aside for permanent pathology. In the middle portion of this tumor despite resection, it was felt  to likely be completely through the muscle fibers of the bladder, there was still visible tumor at this location is concerning for likely stage III or more tumor, likely going completely through the bladder wall in this location.  Fulguration current was then applied to the entire resection bed, resulted in excellent hemostasis.  There  was no evidence of through and through bladder perforation by the resection.  Bladder was emptied per cystoscope.  Procedure was then terminated.  The patient tolerated procedure well.  There were no immediate periprocedural complications. The patient was taken to the postanesthesia care unit in stable condition.          ______________________________ Alexis Frock, MD     TM/MEDQ  D:  05/03/2015  T:  05/04/2015  Job:  [id number]

## 2015-05-04 NOTE — Op Note (Deleted)
NAME:  Tracy [name], Tracy [name]              ACCOUNT NO.:  [id number]  MEDICAL RECORD NO.:  [id number]  LOCATION:                                FACILITY:  [facility]  PHYSICIAN:  Alexis Frock, MD     DATE OF BIRTH:  31-Oct-1927  DATE OF PROCEDURE: DATE OF DISCHARGE:  03/03/2015                              OPERATIVE REPORT   DIAGNOSIS:  Large volume recurrent bladder cancer with refractory hematuria.  PROCEDURE: 1. Transection of bladder tumor, volume medium. 2. Bilateral retrograde pyelogram interpretation.  ESTIMATED BLOOD LOSS:  50 mL.  COMPLICATIONS:  None.  SPECIMEN:  Bladder tumor fragments for permanent pathology.  FINDINGS: 1. Approximately 4 cm recurrent predominantly sessile tumor just     superior lateral to the left ureteral orifice. 2. Unremarkable bilateral retrograde pyelograms. 3. Probable stage III or higher bladder cancer.  INDICATION:  [name] is a very pleasant, but somewhat elderly and frail 81 year old lady with some history of high-grade muscle invasive bladder cancer for almost over a year.  She has elected a palliative approach to this with p.r.n. resections for symptomatic recurrences. She has had on and off hematuria and once even requiring hospitalization several months ago in [place] and has been off Coumadin since that time for primary prevention for atrial fibrillation.  Repeat office cystoscopy recently revealed a somewhat large volume bladder tumor recurrence.  Options were discussed for management including truly palliative transition versus transection of bladder tumor for goal of symptomatic control and tumor control, noncurative intent, and she wished to proceed with the latter.  Informed consent was obtained and placed in medical record.  PROCEDURE IN DETAIL:  The patient being Tracy [name], was verified. Procedure being transection of bladder tumor and retrograde was confirmed.  Procedure was carried out.  Time-out was  performed. Intravenous antibiotics were administered.  General LMA anesthesia introduced.  Patient placed into a low lithotomy position and sterile field was created by prepping and draping the patient's vagina, introitus, and proximal thighs using iodine x3.  Next, cystourethroscopy was performed using a 23-French cystoscope 30-degree offset lens. Inspection of urinary bladder revealed large volume sessile tumor just superior lateral to the left ureteral orifice, total volume approximately 4 cm2.  Photodocumentation was performed.  Attention was directed to retrograde pyelography.  The right ureteral orifice was cannulated with 6-French Foley catheter and right retrograde pyelogram was obtained.  Right retrograde pyelogram demonstrates single right ureter, single system right kidney.  No filling defects or narrowing noted.  Similarly left retrograde pyelogram was obtained.  The left ureteral orifice was a neo-ureteral orifice having been previously resected.  Left retrograde pyelogram demonstrated a single left ureter with single system left kidney.  No filling defects or narrowing noted.  Next, the cystoscope was exchanged for a 26-French Wolf resectoscope sheath and using larger resectoscope loop, very careful systematic resection was performed of the bladder tumor in question down to the fibromuscular stroma.  Great care was taken to avoid indirect injury to the knee, left ureteral orifice and this did not occur, yet remained visibly patent following numerous bladder tumors.  Chips were set aside for permanent pathology. In the middle portion of this tumor despite resection,  it was felt to likely be completely through the muscle fibers of the bladder, there was still visible tumor at this location is concerning for likely stage III or more tumor, likely going completely through the bladder wall in this location.  Fulguration current was then applied to the entire resection bed,  resulted in excellent hemostasis.  There was no evidence of through and through bladder perforation by the resection.  Bladder was emptied per cystoscope.  Procedure was then terminated.  The patient tolerated procedure well.  There were no immediate periprocedural complications. The patient was taken to the postanesthesia care unit in stable condition.          ______________________________ Alexis Frock, MD     TM/MEDQ  D:  05/03/2015  T:  05/04/2015  Job:  [id number]

## 2015-05-08 ENCOUNTER — Other Ambulatory Visit (HOSPITAL_COMMUNITY): Payer: Self-pay | Admitting: Family Medicine

## 2015-05-08 DIAGNOSIS — Z1231 Encounter for screening mammogram for malignant neoplasm of breast: Secondary | ICD-10-CM

## 2015-05-15 ENCOUNTER — Other Ambulatory Visit (HOSPITAL_COMMUNITY): Payer: Self-pay | Admitting: Family Medicine

## 2015-05-15 DIAGNOSIS — Z1231 Encounter for screening mammogram for malignant neoplasm of breast: Secondary | ICD-10-CM

## 2015-05-28 ENCOUNTER — Ambulatory Visit (HOSPITAL_COMMUNITY): Payer: Medicare Other

## 2015-06-13 ENCOUNTER — Ambulatory Visit (HOSPITAL_COMMUNITY)
Admission: RE | Admit: 2015-06-13 | Discharge: 2015-06-13 | Disposition: A | Discharge status: Home / Self Care | Payer: Medicare Other | Source: Ambulatory Visit | Attending: Family Medicine | Admitting: Family Medicine

## 2015-06-13 DIAGNOSIS — Z1231 Encounter for screening mammogram for malignant neoplasm of breast: Secondary | ICD-10-CM | POA: Diagnosis present

## 2015-06-28 ENCOUNTER — Inpatient Hospital Stay (HOSPITAL_COMMUNITY)
Admission: EM | Admit: 2015-06-28 | Discharge: 2015-07-06 | DRG: 682 | Disposition: A | Discharge status: Skilled Nursing Facility | Payer: Medicare Other | Attending: Family Medicine | Admitting: Family Medicine

## 2015-06-28 ENCOUNTER — Encounter (HOSPITAL_COMMUNITY): Payer: Self-pay | Admitting: *Deleted

## 2015-06-28 ENCOUNTER — Emergency Department (HOSPITAL_COMMUNITY): Payer: Medicare Other

## 2015-06-28 ENCOUNTER — Inpatient Hospital Stay (HOSPITAL_COMMUNITY): Payer: Medicare Other

## 2015-06-28 DIAGNOSIS — I130 Hypertensive heart and chronic kidney disease with heart failure and stage 1 through stage 4 chronic kidney disease, or unspecified chronic kidney disease: Secondary | ICD-10-CM | POA: Diagnosis present

## 2015-06-28 DIAGNOSIS — I5031 Acute diastolic (congestive) heart failure: Secondary | ICD-10-CM | POA: Diagnosis not present

## 2015-06-28 DIAGNOSIS — R0602 Shortness of breath: Secondary | ICD-10-CM

## 2015-06-28 DIAGNOSIS — I480 Paroxysmal atrial fibrillation: Secondary | ICD-10-CM

## 2015-06-28 DIAGNOSIS — I4891 Unspecified atrial fibrillation: Secondary | ICD-10-CM | POA: Diagnosis present

## 2015-06-28 DIAGNOSIS — I272 Other secondary pulmonary hypertension: Secondary | ICD-10-CM | POA: Diagnosis present

## 2015-06-28 DIAGNOSIS — I350 Nonrheumatic aortic (valve) stenosis: Secondary | ICD-10-CM | POA: Diagnosis present

## 2015-06-28 DIAGNOSIS — I482 Chronic atrial fibrillation: Secondary | ICD-10-CM | POA: Diagnosis present

## 2015-06-28 DIAGNOSIS — N1330 Unspecified hydronephrosis: Secondary | ICD-10-CM | POA: Diagnosis present

## 2015-06-28 DIAGNOSIS — M1990 Unspecified osteoarthritis, unspecified site: Secondary | ICD-10-CM | POA: Diagnosis present

## 2015-06-28 DIAGNOSIS — D649 Anemia, unspecified: Secondary | ICD-10-CM | POA: Diagnosis present

## 2015-06-28 DIAGNOSIS — E785 Hyperlipidemia, unspecified: Secondary | ICD-10-CM | POA: Diagnosis present

## 2015-06-28 DIAGNOSIS — Z85038 Personal history of other malignant neoplasm of large intestine: Secondary | ICD-10-CM | POA: Diagnosis not present

## 2015-06-28 DIAGNOSIS — R0600 Dyspnea, unspecified: Secondary | ICD-10-CM | POA: Diagnosis not present

## 2015-06-28 DIAGNOSIS — R7989 Other specified abnormal findings of blood chemistry: Secondary | ICD-10-CM

## 2015-06-28 DIAGNOSIS — I2510 Atherosclerotic heart disease of native coronary artery without angina pectoris: Secondary | ICD-10-CM | POA: Diagnosis present

## 2015-06-28 DIAGNOSIS — C670 Malignant neoplasm of trigone of bladder: Secondary | ICD-10-CM

## 2015-06-28 DIAGNOSIS — D638 Anemia in other chronic diseases classified elsewhere: Secondary | ICD-10-CM | POA: Diagnosis present

## 2015-06-28 DIAGNOSIS — I10 Essential (primary) hypertension: Secondary | ICD-10-CM

## 2015-06-28 DIAGNOSIS — N179 Acute kidney failure, unspecified: Principal | ICD-10-CM | POA: Diagnosis present

## 2015-06-28 DIAGNOSIS — R748 Abnormal levels of other serum enzymes: Secondary | ICD-10-CM | POA: Diagnosis present

## 2015-06-28 DIAGNOSIS — R778 Other specified abnormalities of plasma proteins: Secondary | ICD-10-CM

## 2015-06-28 DIAGNOSIS — K219 Gastro-esophageal reflux disease without esophagitis: Secondary | ICD-10-CM | POA: Diagnosis present

## 2015-06-28 DIAGNOSIS — I5033 Acute on chronic diastolic (congestive) heart failure: Secondary | ICD-10-CM | POA: Diagnosis present

## 2015-06-28 DIAGNOSIS — Z66 Do not resuscitate: Secondary | ICD-10-CM | POA: Diagnosis present

## 2015-06-28 DIAGNOSIS — C679 Malignant neoplasm of bladder, unspecified: Secondary | ICD-10-CM | POA: Diagnosis present

## 2015-06-28 DIAGNOSIS — N183 Chronic kidney disease, stage 3 (moderate): Secondary | ICD-10-CM | POA: Diagnosis present

## 2015-06-28 DIAGNOSIS — J441 Chronic obstructive pulmonary disease with (acute) exacerbation: Secondary | ICD-10-CM | POA: Diagnosis present

## 2015-06-28 DIAGNOSIS — E782 Mixed hyperlipidemia: Secondary | ICD-10-CM

## 2015-06-28 DIAGNOSIS — R911 Solitary pulmonary nodule: Secondary | ICD-10-CM | POA: Diagnosis present

## 2015-06-28 HISTORY — DX: Nonrheumatic aortic (valve) stenosis: I35.0

## 2015-06-28 HISTORY — DX: Unspecified diastolic (congestive) heart failure: I50.30

## 2015-06-28 LAB — CBC WITH DIFFERENTIAL/PLATELET
BASOS ABS: 0 10*3/uL (ref 0.0–0.1)
Basophils Relative: 1 %
EOS ABS: 0.1 10*3/uL (ref 0.0–0.7)
Eosinophils Relative: 2 %
HCT: 34.3 % — ABNORMAL LOW (ref 36.0–46.0)
HEMOGLOBIN: 10.6 g/dL — AB (ref 12.0–15.0)
LYMPHS ABS: 0.7 10*3/uL (ref 0.7–4.0)
Lymphocytes Relative: 14 %
MCH: 25.9 pg — AB (ref 26.0–34.0)
MCHC: 30.9 g/dL (ref 30.0–36.0)
MCV: 83.7 fL (ref 78.0–100.0)
Monocytes Absolute: 0.4 10*3/uL (ref 0.1–1.0)
Monocytes Relative: 8 %
NEUTROS PCT: 75 %
Neutro Abs: 4 10*3/uL (ref 1.7–7.7)
Platelets: 142 10*3/uL — ABNORMAL LOW (ref 150–400)
RBC: 4.1 MIL/uL (ref 3.87–5.11)
RDW: 20.1 % — ABNORMAL HIGH (ref 11.5–15.5)
WBC: 5.3 10*3/uL (ref 4.0–10.5)

## 2015-06-28 LAB — TROPONIN I
TROPONIN I: 0.06 ng/mL — AB (ref <0.031)
TROPONIN I: 0.06 ng/mL — AB (ref <0.031)
TROPONIN I: 0.06 ng/mL — AB (ref <0.031)

## 2015-06-28 LAB — BASIC METABOLIC PANEL
ANION GAP: 8 (ref 5–15)
BUN: 74 mg/dL — ABNORMAL HIGH (ref 6–20)
CHLORIDE: 107 mmol/L (ref 101–111)
CO2: 28 mmol/L (ref 22–32)
Calcium: 9.5 mg/dL (ref 8.9–10.3)
Creatinine, Ser: 3.8 mg/dL — ABNORMAL HIGH (ref 0.44–1.00)
GFR calc non Af Amer: 10 mL/min — ABNORMAL LOW (ref >60)
GFR, EST AFRICAN AMERICAN: 12 mL/min — AB (ref >60)
Glucose, Bld: 149 mg/dL — ABNORMAL HIGH (ref 65–99)
POTASSIUM: 4.1 mmol/L (ref 3.5–5.1)
SODIUM: 143 mmol/L (ref 135–145)

## 2015-06-28 LAB — BRAIN NATRIURETIC PEPTIDE: B NATRIURETIC PEPTIDE 5: 998 pg/mL — AB (ref 0.0–100.0)

## 2015-06-28 MED ORDER — DILTIAZEM HCL ER COATED BEADS 120 MG PO CP24
120.0000 mg | ORAL_CAPSULE | Freq: Every day | ORAL | Status: DC
  Administered 2015-06-29: 120 mg via ORAL
  Filled 2015-06-28: qty 1

## 2015-06-28 MED ORDER — ACETAMINOPHEN 500 MG PO TABS
500.0000 mg | ORAL_TABLET | Freq: Four times a day (QID) | ORAL | Status: DC | PRN
  Administered 2015-06-28 – 2015-07-04 (×3): 500 mg via ORAL
  Filled 2015-06-28 (×3): qty 1

## 2015-06-28 MED ORDER — ADULT MULTIVITAMIN W/MINERALS CH
1.0000 | ORAL_TABLET | Freq: Every day | ORAL | Status: DC
  Administered 2015-06-28 – 2015-07-06 (×9): 1 via ORAL
  Filled 2015-06-28 (×14): qty 1

## 2015-06-28 MED ORDER — DOCUSATE SODIUM 100 MG PO CAPS
100.0000 mg | ORAL_CAPSULE | Freq: Every day | ORAL | Status: DC | PRN
  Administered 2015-07-03: 100 mg via ORAL
  Filled 2015-06-28: qty 1

## 2015-06-28 MED ORDER — CALCIUM CARBONATE 1250 (500 CA) MG PO TABS
1250.0000 mg | ORAL_TABLET | Freq: Every day | ORAL | Status: DC
  Administered 2015-06-28 – 2015-07-06 (×9): 1250 mg via ORAL
  Filled 2015-06-28 (×9): qty 1

## 2015-06-28 MED ORDER — ROSUVASTATIN CALCIUM 20 MG PO TABS
20.0000 mg | ORAL_TABLET | Freq: Every day | ORAL | Status: DC
  Administered 2015-06-28 – 2015-07-06 (×9): 20 mg via ORAL
  Filled 2015-06-28 (×9): qty 1

## 2015-06-28 MED ORDER — ASPIRIN 81 MG PO CHEW
324.0000 mg | CHEWABLE_TABLET | Freq: Once | ORAL | Status: AC
  Administered 2015-06-28: 324 mg via ORAL
  Filled 2015-06-28: qty 4

## 2015-06-28 MED ORDER — METOPROLOL TARTRATE 25 MG PO TABS
25.0000 mg | ORAL_TABLET | Freq: Two times a day (BID) | ORAL | Status: DC
  Administered 2015-06-28 – 2015-07-06 (×13): 25 mg via ORAL
  Filled 2015-06-28 (×13): qty 1

## 2015-06-28 MED ORDER — HEPARIN SODIUM (PORCINE) 5000 UNIT/ML IJ SOLN
5000.0000 [IU] | Freq: Three times a day (TID) | INTRAMUSCULAR | Status: DC
  Administered 2015-06-28 – 2015-07-06 (×24): 5000 [IU] via SUBCUTANEOUS
  Filled 2015-06-28 (×23): qty 1

## 2015-06-28 MED ORDER — FUROSEMIDE 10 MG/ML IJ SOLN
20.0000 mg | Freq: Once | INTRAMUSCULAR | Status: AC
  Administered 2015-06-28: 20 mg via INTRAVENOUS
  Filled 2015-06-28: qty 2

## 2015-06-28 MED ORDER — SODIUM CHLORIDE 0.9 % IV SOLN
INTRAVENOUS | Status: DC
  Administered 2015-06-28 – 2015-06-29 (×3): via INTRAVENOUS
  Administered 2015-06-30 (×2): 75 mL/h via INTRAVENOUS
  Administered 2015-07-01: 1000 mL via INTRAVENOUS
  Administered 2015-07-01: 22:00:00 via INTRAVENOUS

## 2015-06-28 MED ORDER — SODIUM CHLORIDE 0.9 % IJ SOLN
3.0000 mL | Freq: Two times a day (BID) | INTRAMUSCULAR | Status: DC
  Administered 2015-06-28 – 2015-07-05 (×8): 3 mL via INTRAVENOUS

## 2015-06-28 NOTE — Consult Note (Signed)
Primary cardiologist: Dr. Darlina Guys Consulting cardiologist: Dr. Satira Sark  Reason for consultation: Abnormal troponin I  Clinical Summary Ms. Fehrenbach is an 81 y.o.female with past medical history outlined below, last seen by Dr. Angelena Form in June of this year for routine follow-up. She has a history of bladder cancer and is status post TURBT, most recently in October followed by Dr. Tresa Moore. She has been taken off of aspirin and anticoagulation given hematuria, has history of atrial fibrillation at baseline. She presented to the ER today with increased sense of shortness of breath over the last few days. Also atypical, very brief (lasting a few seconds), sharp chest discomfort without known precipitant. She has had no palpitations.  ECG shows rate-controlled atrial ablation with LVH and repolarization abnormalities, PVCs versus aberrantly conducted beats. Troponin I levels or minimally abnormal at 0.06 and flat pattern. BNP elevated at 998.  She has been found to have small pleural effusions by chest CT imaging as well as a right lower lobe nodule that is concerning for potential malignancy, she also has smaller nodules seen bilaterally.   She states that she has been making normal urine output, no recent hematuria. Lab work shows acute renal failure however with creatinine up to 3.8.  Home medications reviewed, she has been on Lasix at 40 mg daily, also Cardizem CD 120 mg daily and Lopressor 25 mg twice daily. Currently not on diuretic, although she was given Lasix in the ER.  Allergies  Allergen Reactions  . Amoxicillin   . Penicillins   . Sulfonamide Derivatives     Medications Scheduled Medications: . calcium carbonate  1,250 mg Oral Q1200  . [START ON 06/29/2015] diltiazem  120 mg Oral Daily  . heparin  5,000 Units Subcutaneous 3 times per day  . metoprolol tartrate  25 mg Oral BID  . multivitamin with minerals  1 tablet Oral Q1200  . rosuvastatin  20 mg Oral QPC  lunch  . sodium chloride  3 mL Intravenous Q12H    PRN Medications: acetaminophen, docusate sodium   Past Medical History  Diagnosis Date  . Hyperlipidemia   . Coronary artery disease     Nonobstructive at cardiac catheterizatio 2003  . Venous insufficiency   . Diastolic heart failure ([place])   . GERD (gastroesophageal reflux disease)   . Mitral valve prolapse   . Shoulder pain, right   . Headache   . Arthritis   . Sleeping difficulties   . Atrial fibrillation ([place])   . Bladder cancer ([place])   . History of blood transfusion 02/2015    Received 1 unit PRBCs d/t hemorrhage in bladder s/p TUBRT and warfarin  . Aortic stenosis     Mild    Past Surgical History  Procedure Laterality Date  . Colonoscopy N/A 09/30/2012    Procedure: COLONOSCOPY;  Surgeon: Rogene Houston, MD;  Location: [facility];  Service: Endoscopy;  Laterality: N/A;  930  . Cardioversion      x2  . Back surgery      x2-lower back  . Transurethral resection of bladder tumor with gyrus (turbt-gyrus) N/A 07/19/2014    Procedure: TRANSURETHRAL RESECTION OF BLADDER TUMOR WITH GYRUS (TURBT-GYRUS) WITH CYSTOGRAM;  Surgeon: Alexis Frock, MD;  Location: [facility];  Service: Urology;  Laterality: N/A;  . Transurethral resection of bladder tumor with gyrus (turbt-gyrus) N/A 07/20/2014    Procedure: SECOND LOOK TRANSURETHRAL RESECTION OF BLADDER TUMOR WITH GYRUS (TURBT-GYRUS);  Surgeon: Alexis Frock, MD;  Location: [facility];  Service: Urology;  Laterality: N/A;  . Cataract extraction    . Transurethral resection of bladder tumor with gyrus (turbt-gyrus) N/A 12/20/2014    Procedure: TRANSURETHRAL RESECTION OF BLADDER TUMOR ;  Surgeon: Alexis Frock, MD;  Location: [facility];  Service: Urology;  Laterality: N/A;  . Cystoscopy w/ retrogrades Bilateral 12/20/2014    Procedure: CYSTOSCOPY WITH RETROGRADE PYELOGRAM;  Surgeon: Alexis Frock, MD;  Location: [facility];  Service: Urology;  Laterality: Bilateral;  . Transurethral resection  of bladder tumor with gyrus (turbt-gyrus) N/A 05/03/2015    Procedure: TRANSURETHRAL RESECTION OF BLADDER TUMOR;  Surgeon: Alexis Frock, MD;  Location: [facility]  Service: Urology;  Laterality: N/A;  . Cystoscopy w/ retrogrades Bilateral 05/03/2015    Procedure: CYSTOSCOPY WITH RETROGRADE PYELOGRAM;  Surgeon: Alexis Frock, MD;  Location: [facility];  Service: Urology;  Laterality: Bilateral;    Family History  Problem Relation Age of Onset  . Colon cancer Neg Hx   . Colon polyps Sister     Social History Ms. Taniguchi reports that she has never smoked. She has never used smokeless tobacco. Ms. [place] reports that she does not drink alcohol.  Review of Systems Complete review of systems negative except as otherwise outlined in the clinical summary and also the following. Reports poor appetite. Generally feels weak. No palpitations.  Physical Examination Blood pressure 185/69, pulse 65, temperature 98 F (36.7 C), temperature source Oral, resp. rate 18, height 5\' 3"  (1.6 m), weight 136 lb 7.4 oz (61.9 kg), SpO2 93 %.  Intake/Output Summary (Last 24 hours) at 06/28/15 1455 Last data filed at 06/28/15 1200  Gross per 24 hour  Intake    240 ml  Output      0 ml  Net    240 ml   Telemetry: Atrial fibrillation.  Gen.: Somewhat frail-appearing elderly woman, no distress. HEENT: Conjunctiva and lids normal, oropharynx clear. Neck: Supple, mildly elevated JVP, no carotid bruits, no thyromegaly. Lungs: Decreased breath sounds with scattered crackles at the bases, nonlabored breathing at rest. Cardiac: Irregularly irregular, no S3, 2/6 systolic murmur, no pericardial rub. Abdomen: Soft, nontender, bowel sounds present, no guarding or rebound. Extremities: No pitting edema, distal pulses 2+. Skin: Warm and dry. Musculoskeletal: Mild kyphosis. Neuropsychiatric: Alert and oriented x3, affect grossly appropriate.  Lab Results  Basic Metabolic  Panel:  Recent Labs Lab 06/28/15 0917  NA 143  K 4.1  CL 107  CO2 28  GLUCOSE 149*  BUN 74*  CREATININE 3.80*  CALCIUM 9.5    CBC:  Recent Labs Lab 06/28/15 0917  WBC 5.3  NEUTROABS 4.0  HGB 10.6*  HCT 34.3*  MCV 83.7  PLT 142*    Cardiac Enzymes:  Recent Labs Lab 06/28/15 0917 06/28/15 1255  TROPONINI 0.06* 0.06*   Imaging  Chest CT 06/28/2015: FINDINGS: Sagittal images of the spine shows osteopenia and degenerative changes thoracic spine.  There is dextroscoliosis and mild kyphosis of thoracic spine.  Extensive atherosclerotic calcifications of thoracic aorta. Atherosclerotic calcifications of coronary arteries.  Study is limited without IV contrast. A precarinal lymph node measures 9 mm short-axis. No significant hilar adenopathy.  Central airways are patent. There is cardiomegaly. Bilateral small pleural effusion. There is right base atelectasis. Left base atelectasis or infiltrate.  As noted on chest x-ray there is a nodular lesion in right lower lobe posteriorly. This is best visualized in sagittal image 20 measures 1.6 by 1.5 cm. Malignancy cannot be excluded. Further correlation with PET scan  and/or biopsy is recommended. There is a nodule in superior segment of right lower lobe measures 8 mm. Second nodule in superior segment of right lower lobe measures 6 mm. A nodule in right upper lobe laterally just anterior to the fissure measures 7 mm. Metastatic disease cannot be excluded. There is a pleural-based nodule in left upper lobe posteriorly measures 8 mm.  There is no pulmonary edema. Atherosclerotic calcifications of abdominal aorta. No adrenal gland mass is noted in visualized upper abdomen. Atherosclerotic calcifications of splenic artery.  IMPRESSION: 1. As noted on chest x-ray there is a nodular lesion in right lower lobe posteriorly. This is best seen in sagittal image 20 measures 1.6 by 1.5 cm. Malignancy cannot be  excluded. Further correlation with PET scan and/or biopsy is recommended. 2. No mediastinal or hilar adenopathy. 3. Additional smaller nodules are noted bilaterally. Metastatic disease cannot be excluded. 4. Atherosclerotic calcifications of thoracic and abdominal aorta. Atherosclerotic calcifications of coronary arteries. 5. Degenerative changes thoracic spine. 6. Bilateral small pleural effusion. There is atelectasis in right lower lobe posteriorly. Atelectasis or infiltrate in left lower lobe posteriorly. 7. Cardiomegaly is noted.  Impression  1. Minimal troponin I elevation in flat pattern, 0.06 at this time. ACS is not suspected. She has had some atypical, brief chest discomfort, but also shortness of breath and probable component of volume overload with diastolic dysfunction. Chest CT abnormalities may also be related to another process, malignancy suspected with finding of nodule and pleural effusions.  2. Chronic atrial fibrillation, currently rate controlled on beta blocker and calcium channel blocker. She has not been anticoagulated with history of hematuria and bladder cancer.  3. Essential hypertension, blood pressure elevated at present. She reports compliance with her medications.  4. Acute renal failure, creatinine 3.8 up from 1.3 in August. Further workup pending per primary team.  5. History of nonobstructive CAD by cardiac catheterization in 2003.  6. Hyperlipidemia, on Crestor as an outpatient.  Recommendations  Continue Lopressor and Cardizem CD for heart rate control of atrial fibrillation. Cycle cardiac markers to completion, but ACS is not suspected at this time. Continue to hold off on anticoagulation in light of bladder cancer with hematuria. Follow-up echocardiogram will be obtained to reassess LVEF. Main focus of workup anticipated to be evaluation for etiology of acute renal failure and also the chest CT abnormalities - per primary team. We will follow with  you.  Satira Sark, M.D., F.A.C.C.

## 2015-06-28 NOTE — H&P (Signed)
Patient Demographics  Tracy [name], is a 81 y.o. female  MRN: [id number]   DOB - 05-22-1928  Admit Date - 06/28/2015  Outpatient Primary MD for the patient is Tracy Hampshire, MD   With History of -  Past Medical History  Diagnosis Date  . Hyperlipidemia   . Coronary artery disease   . Venous insufficiency   . CHF (congestive heart failure) (HCC)     acute diastolic heart failure  . Arrhythmia     chronic afib  . GERD (gastroesophageal reflux disease)   . Mitral valve prolapse   . Shoulder pain, right   . Swelling     both legs and feet  . Shortness of breath dyspnea   . Headache     occasional headache  . Arthritis   . Sleeping difficulties   . Atrial fibrillation (Tracy [name])   . Bladder cancer ([place])   . History of blood transfusion 02/2015    received 1 uprbc's d/t hemorrhage in [place] s/p turbt and warfarin      Past Surgical History  Procedure Laterality Date  . Colonoscopy N/A 09/30/2012    Procedure: COLONOSCOPY;  Surgeon: Tracy Houston, MD;  Location: [facility];  Service: Endoscopy;  Laterality: N/A;  930  . Cardioversion      x2  . Back surgery      x2-lower back  . Transurethral resection of bladder tumor with gyrus (turbt-gyrus) N/A 07/19/2014    Procedure: TRANSURETHRAL RESECTION OF BLADDER TUMOR WITH GYRUS (TURBT-GYRUS) WITH CYSTOGRAM;  Surgeon: Tracy Frock, MD;  Location: [facility];  Service: Urology;  Laterality: N/A;  . Transurethral resection of bladder tumor with gyrus (turbt-gyrus) N/A 07/20/2014    Procedure: SECOND LOOK TRANSURETHRAL RESECTION OF BLADDER TUMOR WITH GYRUS (TURBT-GYRUS);  Surgeon: Tracy Frock, MD;  Location: [facility];  Service: Urology;  Laterality: N/A;  . Cataract extraction    . Transurethral resection of bladder tumor with gyrus (turbt-gyrus) N/A 12/20/2014    Procedure: TRANSURETHRAL RESECTION OF BLADDER TUMOR ;  Surgeon: Tracy Frock, MD;  Location: [facility];  Service: Urology;  Laterality: N/A;  . Cystoscopy w/  retrogrades Bilateral 12/20/2014    Procedure: CYSTOSCOPY WITH RETROGRADE PYELOGRAM;  Surgeon: Tracy Frock, MD;  Location: [facility];  Service: Urology;  Laterality: Bilateral;  . Transurethral resection of bladder tumor with gyrus (turbt-gyrus) N/A 05/03/2015    Procedure: TRANSURETHRAL RESECTION OF BLADDER TUMOR;  Surgeon: Tracy Frock, MD;  Location: [facility];  Service: Urology;  Laterality: N/A;  . Cystoscopy w/ retrogrades Bilateral 05/03/2015    Procedure: CYSTOSCOPY WITH RETROGRADE PYELOGRAM;  Surgeon: Tracy Frock, MD;  Location: [facility];  Service: Urology;  Laterality: Bilateral;    in for   Chief Complaint  Patient presents with  . Shortness of Breath     HPI  Tracy [name]  is a 81 y.o. female, with past medical history of bladder cancer, status post TURBT x 3, most recent in 05/03/2015 (Dr. Tresa [name]), history of A. Fib(anticoagulation been stopped secondary to hematuria) aortic stenosis, chronic diastolic CHF, history of CAD, hyperlipidemia, presents with complaints of progressive dyspnea, report developed over the last 24 hours, worse upon exertion and  laying supine, reports she has been compliant with her Lasix, as well reports mild lower extremity edema, workup in ED significant for elevated BNP, and volume overload on chest x-ray, as well workup was significant for acute renal failure, with creatinine of 3.8, no evidence of urinary retention, bladder scan  of 130 ml, and reports episodes of intermittent chest pain last few seconds, currently chest pain-free. patient was given 20 mg of IV Lasix in ED, hospitalist requested to admit.    Review of Systems    In addition to the HPI above,  No Fever-chills, No Headache, No changes with Vision or hearing, No problems swallowing food or Liquids, Brief episodes of Chest pain, and dyspnea, denies cough No Abdominal pain, No Nausea or Vommitting, Bowel movements are regular, No Blood in stool or  Urine, No dysuria, No new skin rashes or bruises, No new joints pains-aches,  No new weakness, tingling, numbness in any extremity, No recent weight gain or loss, No polyuria, polydypsia or polyphagia, No significant Mental Stressors.  A full 10 point Review of Systems was done, except as stated above, all other Review of Systems were negative.   Social History Social History  Substance Use Topics  . Smoking status: Never Smoker   . Smokeless tobacco: Never Used  . Alcohol Use: No     Family History Family History  Problem Relation Age of Onset  . Colon cancer Neg Hx   . Colon polyps Sister      Prior to Admission medications   Medication Sig Start Date End Date Taking? Authorizing Provider  acetaminophen (TYLENOL) 500 MG tablet Take 500 mg by mouth every 6 (six) hours as needed for pain.   Yes Historical Provider, MD  calcium carbonate (OS-CAL) 600 MG TABS Take 600 mg by mouth daily at 12 noon.    Yes Historical Provider, MD  diltiazem (CARDIZEM CD) 120 MG 24 hr capsule TAKE (1) CAPSULE DAILY 01/23/15  Yes Burnell Blanks, MD  docusate sodium 100 MG CAPS Take 100 mg by mouth 2 (two) times daily. Patient taking differently: Take 100 mg by mouth daily as needed (stool softner).  07/22/14  Yes Franchot Gallo, MD  furosemide (LASIX) 40 MG tablet Take 1 tablet (40 mg total) by mouth daily. 08/23/14  Yes Marjean Donna, MD  meclizine (ANTIVERT) 12.5 MG tablet Take 12.5 mg by mouth 3 (three) times daily as needed for dizziness.    Yes Historical Provider, MD  metoprolol tartrate (LOPRESSOR) 25 MG tablet Take 1 tablet (25 mg total) by mouth 2 (two) times daily. 04/04/15  Yes Burnell Blanks, MD  Multiple Vitamins-Minerals (MULTIVITAMINS THER. W/MINERALS) TABS Take 1 tablet by mouth daily at 12 noon.    Yes Historical Provider, MD  rosuvastatin (CRESTOR) 20 MG tablet Take 20 mg by mouth daily after lunch.    Yes Historical Provider, MD    Allergies  Allergen Reactions  .  Amoxicillin Other (See Comments)    Unknown  . Penicillins Other (See Comments)    Physical Exam  Vitals  Blood pressure 192/89, pulse 89, resp. rate 16, SpO2 99 %.   1. General frail elderly female lying in bed in NAD,    2. Normal affect and insight, Not Suicidal or Homicidal, Awake Alert, Oriented X 3.  3. No F.N deficits, ALL C.Nerves Intact, Strength 5/5 all 4 extremities, Sensation intact all 4 extremities, Plantars down going.  4. Ears and Eyes appear Normal, Conjunctivae clear, PERRLA. Moist Oral Mucosa.  5. Supple Neck, + JVD,  No Carotid Bruits.  6. Symmetrical Chest wall movement, Good air movement bilaterally, CTAB.  7. Irregular, No Gallops, Rubs or Murmurs, No Parasternal Heave.  8. Positive Bowel Sounds, Abdomen Soft, No tenderness, No organomegaly appriciated,No rebound -guarding or rigidity.  9.  No Cyanosis, Normal  Skin Turgor, No Skin Rash or Bruise. +1 edema bilaterally  10. Good muscle tone,  joints appear normal , no effusions, Normal ROM.    Data Review  CBC  Recent Labs Lab 06/28/15 0917  WBC 5.3  HGB 10.6*  HCT 34.3*  PLT 142*  MCV 83.7  MCH 25.9*  MCHC 30.9  RDW 20.1*  LYMPHSABS 0.7  MONOABS 0.4  EOSABS 0.1  BASOSABS 0.0   ------------------------------------------------------------------------------------------------------------------  Chemistries   Recent Labs Lab 06/28/15 0917  NA 143  K 4.1  CL 107  CO2 28  GLUCOSE 149*  BUN 74*  CREATININE 3.80*  CALCIUM 9.5   ------------------------------------------------------------------------------------------------------------------ CrCl cannot be calculated (Unknown ideal weight.). ------------------------------------------------------------------------------------------------------------------ No results for input(s): TSH, T4TOTAL, T3FREE, THYROIDAB in the last 72 hours.  Invalid input(s): FREET3   Coagulation profile No results for input(s): INR, PROTIME in the  last 168 hours. ------------------------------------------------------------------------------------------------------------------- No results for input(s): DDIMER in the last 72 hours. -------------------------------------------------------------------------------------------------------------------  Cardiac Enzymes  Recent Labs Lab 06/28/15 0917  TROPONINI 0.06*   ------------------------------------------------------------------------------------------------------------------ Invalid input(s): POCBNP   ---------------------------------------------------------------------------------------------------------------  Urinalysis    Component Value Date/Time   COLORURINE RED* 03/12/2015 0050   APPEARANCEUR CLOUDY* 03/12/2015 0050   LABSPEC 1.015 03/12/2015 0050   PHURINE 8.0 03/12/2015 0050   GLUCOSEU 100* 03/12/2015 0050   HGBUR LARGE* 03/12/2015 0050   BILIRUBINUR NEGATIVE 03/12/2015 0050   KETONESUR 15* 03/12/2015 0050   PROTEINUR >300* 03/12/2015 0050   UROBILINOGEN 4.0* 03/12/2015 0050   NITRITE POSITIVE* 03/12/2015 0050   LEUKOCYTESUR MODERATE* 03/12/2015 0050    ----------------------------------------------------------------------------------------------------------------  Imaging results:   Dg Chest 2 View  06/28/2015  CLINICAL DATA:  Shortness of Breath EXAM: CHEST  2 VIEW COMPARISON:  12/15/2014 FINDINGS: Cardiomegaly again noted. Central mild vascular congestion without convincing pulmonary edema. Osteopenia and degenerative changes thoracic spine. Bilateral small pleural effusion with bilateral basilar atelectasis left greater than right. There is poorly visualized nodular density right base measures 2 cm. A lung nodule cannot be excluded. Further correlation with CT scan of the chest is recommended. Atherosclerotic calcifications of thoracic aorta. IMPRESSION: Central mild vascular congestion without convincing pulmonary edema. Osteopenia and degenerative changes  thoracic spine. Bilateral small pleural effusion with bilateral basilar atelectasis left greater than right. There is poorly visualized nodular density right base measures 2 cm. A lung nodule cannot be excluded. Further correlation with CT scan of the chest is recommended. Electronically Signed   By: Lahoma Crocker M.D.   On: 06/28/2015 09:52    My personal review of EKG: Rhythm NSR, Rate 77  /min, QTc 429 , changes in T waves in lateral leads    Assessment & Plan  Principal Problem:   Acute renal failure (ARF) (HCC) Active Problems:   Dyslipidemia   Essential hypertension   ATRIAL FIBRILLATION, CHRONIC   Acute diastolic heart failure-(recurrent)   Bladder cancer- surgery 07/19/14   Elevated troponin   Acute renal failure - Unclear radiology, will start with urinalysis, urine sodium, renal ultrasound, currently no evidence of urinary retention, normal bladder scan, avoid nephrotoxic medications, patient received IV Lasix in ED, will held and further diuresis.  Acute on chronic diastolic CHF - Impression presents with progressive dyspnea, evidence of volume overload including bilateral pleural effusion and vascular congestion on chest x-ray, will continue to monitor, received IV Lasix in ED, will hold on further diuresis and doing further workup for renal failure.  Chest pain - No recurrence, borderline troponin most likely related to renal failure, received aspirin in  ED, will continue to monitor on telemetry, will cycle troponin and consult cardiology.  A. Fib - Rate controlled, continue with metoprolol and Cardizem, anticoagulation been stopped as an outpatient giving her significant hematuria.  Bladder cancer - followedby Dr. Tresa [name], consult urology if there is evidence of hydronephrosis in renal ultrasound  Pulmonary nodule on chest x-ray - CT chest pending for further evaluation  Dyslipidemia - Continue with statin  Hypertension - Continue with home medication  DVT  Prophylaxis Heparin -    AM Labs Ordered, also please review Full Orders  Family Communication: Admission, patients condition and plan of care including tests being ordered have been discussed with the patient and grandson who indicate understanding and agree with the plan and Code Status.  Code Status DNR (confirmed by patient)  Likely DC to  home when stable  Condition GUARDED    Time spent in minutes : 55 minutes    Elmo Shumard M.D on 06/28/2015 at 11:14 AM  Between 7am to 7pm - Pager - [phone]  After 7pm go to [email or web address] - password [email or web address]  And look for the night coverage person covering me after hours  [facility] Office  [phone]

## 2015-06-28 NOTE — ED Notes (Signed)
Bladder scan shows 131ml.  Pt states she last voided at 0800.

## 2015-06-28 NOTE — Consult Note (Signed)
Reason for Consult: Acute kidney injury superimposed on chronic Referring Physician: Dr. Jackelyn Hoehn Traynor is an 81 y.o. female.  HPI: She is a patient who has coronary artery disease, a trial fibrillation, history of bladder cancer status post transurethral resection presently came with some exertional dyspnea, poor appetite for the last couple of days. Patient also complains of some difficulty breathing on and off. Presently she states that she is slightly feeling better. Since she has lost her husband she is living alone at times very hard for her token around. She doesn't have any nausea or vomiting. She denies also any fever or chills or sweating. She has occasional dry cough. Patient says that she has some problem with her kidneys for many years and has been followed by urology in [place].   Past Medical History  Diagnosis Date  . Hyperlipidemia   . Coronary artery disease     Nonobstructive at cardiac catheterizatio 2003  . Venous insufficiency   . Diastolic heart failure ([place])   . GERD (gastroesophageal reflux disease)   . Mitral valve prolapse   . Shoulder pain, right   . Headache   . Arthritis   . Sleeping difficulties   . Atrial fibrillation ([place])   . Bladder cancer ([place])   . History of blood transfusion 02/2015    Received 1 unit PRBCs d/t hemorrhage in bladder s/p TUBRT and warfarin  . Aortic stenosis     Mild    Past Surgical History  Procedure Laterality Date  . Colonoscopy N/A 09/30/2012    Procedure: COLONOSCOPY;  Surgeon: Rogene Houston, MD;  Location: [facility];  Service: Endoscopy;  Laterality: N/A;  930  . Cardioversion      x2  . Back surgery      x2-lower back  . Transurethral resection of bladder tumor with gyrus (turbt-gyrus) N/A 07/19/2014    Procedure: TRANSURETHRAL RESECTION OF BLADDER TUMOR WITH GYRUS (TURBT-GYRUS) WITH CYSTOGRAM;  Surgeon: Alexis Frock, MD;  Location: [facility];  Service: Urology;  Laterality: N/A;  . Transurethral  resection of bladder tumor with gyrus (turbt-gyrus) N/A 07/20/2014    Procedure: SECOND LOOK TRANSURETHRAL RESECTION OF BLADDER TUMOR WITH GYRUS (TURBT-GYRUS);  Surgeon: Alexis Frock, MD;  Location: [facility];  Service: Urology;  Laterality: N/A;  . Cataract extraction    . Transurethral resection of bladder tumor with gyrus (turbt-gyrus) N/A 12/20/2014    Procedure: TRANSURETHRAL RESECTION OF BLADDER TUMOR ;  Surgeon: Alexis Frock, MD;  Location: [facility];  Service: Urology;  Laterality: N/A;  . Cystoscopy w/ retrogrades Bilateral 12/20/2014    Procedure: CYSTOSCOPY WITH RETROGRADE PYELOGRAM;  Surgeon: Alexis Frock, MD;  Location: [facility];  Service: Urology;  Laterality: Bilateral;  . Transurethral resection of bladder tumor with gyrus (turbt-gyrus) N/A 05/03/2015    Procedure: TRANSURETHRAL RESECTION OF BLADDER TUMOR;  Surgeon: Alexis Frock, MD;  Location: [facility];  Service: Urology;  Laterality: N/A;  . Cystoscopy w/ retrogrades Bilateral 05/03/2015    Procedure: CYSTOSCOPY WITH RETROGRADE PYELOGRAM;  Surgeon: Alexis Frock, MD;  Location: [facility];  Service: Urology;  Laterality: Bilateral;    Family History  Problem Relation Age of Onset  . Colon cancer Neg Hx   . Colon polyps Sister     Social History:  reports that she has never smoked. She has never used smokeless tobacco. She reports that she does not drink alcohol or use illicit drugs.  Allergies:  Allergies  Allergen Reactions  . Amoxicillin   .  Penicillins   . Sulfonamide Derivatives     Medications: I have reviewed the patient's current medications.  Results for orders placed or performed during the hospital encounter of 06/28/15 (from the past 48 hour(s))  Brain natriuretic peptide     Status: Abnormal   Collection Time: 06/28/15  9:16 AM  Result Value Ref Range   B Natriuretic Peptide 998.0 (H) 0.0 - 100.0 pg/mL  CBC with Differential/Platelet     Status: Abnormal   Collection  Time: 06/28/15  9:17 AM  Result Value Ref Range   WBC 5.3 4.0 - 10.5 K/uL   RBC 4.10 3.87 - 5.11 MIL/uL   Hemoglobin 10.6 (L) 12.0 - 15.0 g/dL   HCT 34.3 (L) 36.0 - 46.0 %   MCV 83.7 78.0 - 100.0 fL   MCH 25.9 (L) 26.0 - 34.0 pg   MCHC 30.9 30.0 - 36.0 g/dL   RDW 20.1 (H) 11.5 - 15.5 %   Platelets 142 (L) 150 - 400 K/uL   Neutrophils Relative % 75 %   Neutro Abs 4.0 1.7 - 7.7 K/uL   Lymphocytes Relative 14 %   Lymphs Abs 0.7 0.7 - 4.0 K/uL   Monocytes Relative 8 %   Monocytes Absolute 0.4 0.1 - 1.0 K/uL   Eosinophils Relative 2 %   Eosinophils Absolute 0.1 0.0 - 0.7 K/uL   Basophils Relative 1 %   Basophils Absolute 0.0 0.0 - 0.1 K/uL  Basic metabolic panel     Status: Abnormal   Collection Time: 06/28/15  9:17 AM  Result Value Ref Range   Sodium 143 135 - 145 mmol/L   Potassium 4.1 3.5 - 5.1 mmol/L   Chloride 107 101 - 111 mmol/L   CO2 28 22 - 32 mmol/L   Glucose, Bld 149 (H) 65 - 99 mg/dL   BUN 74 (H) 6 - 20 mg/dL   Creatinine, Ser 3.80 (H) 0.44 - 1.00 mg/dL   Calcium 9.5 8.9 - 10.3 mg/dL   GFR calc non Af Amer 10 (L) >60 mL/min   GFR calc Af Amer 12 (L) >60 mL/min    Comment: (NOTE) The eGFR has been calculated using the CKD EPI equation. This calculation has not been validated in all clinical situations. eGFR's persistently <60 mL/min signify possible Chronic Kidney Disease.    Anion gap 8 5 - 15  Troponin I     Status: Abnormal   Collection Time: 06/28/15  9:17 AM  Result Value Ref Range   Troponin I 0.06 (H) <0.031 ng/mL    Comment:        PERSISTENTLY INCREASED TROPONIN VALUES IN THE RANGE OF 0.04-0.49 ng/mL CAN BE SEEN IN:       -UNSTABLE ANGINA       -CONGESTIVE HEART FAILURE       -MYOCARDITIS       -CHEST TRAUMA       -ARRYHTHMIAS       -LATE PRESENTING MYOCARDIAL INFARCTION       -COPD   CLINICAL FOLLOW-UP RECOMMENDED.   Troponin I (q 6hr x 3)     Status: Abnormal   Collection Time: 06/28/15 12:55 PM  Result Value Ref Range   Troponin I 0.06  (H) <0.031 ng/mL    Comment:        PERSISTENTLY INCREASED TROPONIN VALUES IN THE RANGE OF 0.04-0.49 ng/mL CAN BE SEEN IN:       -UNSTABLE ANGINA       -CONGESTIVE HEART FAILURE       -  MYOCARDITIS       -CHEST TRAUMA       -ARRYHTHMIAS       -LATE PRESENTING MYOCARDIAL INFARCTION       -COPD   CLINICAL FOLLOW-UP RECOMMENDED.     Dg Chest 2 View  06/28/2015  CLINICAL DATA:  Shortness of Breath EXAM: CHEST  2 VIEW COMPARISON:  12/15/2014 FINDINGS: Cardiomegaly again noted. Central mild vascular congestion without convincing pulmonary edema. Osteopenia and degenerative changes thoracic spine. Bilateral small pleural effusion with bilateral basilar atelectasis left greater than right. There is poorly visualized nodular density right base measures 2 cm. A lung nodule cannot be excluded. Further correlation with CT scan of the chest is recommended. Atherosclerotic calcifications of thoracic aorta. IMPRESSION: Central mild vascular congestion without convincing pulmonary edema. Osteopenia and degenerative changes thoracic spine. Bilateral small pleural effusion with bilateral basilar atelectasis left greater than right. There is poorly visualized nodular density right base measures 2 cm. A lung nodule cannot be excluded. Further correlation with CT scan of the chest is recommended. Electronically Signed   By: Lahoma Crocker M.D.   On: 06/28/2015 09:52   Ct Chest Wo Contrast  06/28/2015  CLINICAL DATA:  Abnormal chest x-ray EXAM: CT CHEST WITHOUT CONTRAST TECHNIQUE: Multidetector CT imaging of the chest was performed following the standard protocol without IV contrast. COMPARISON:  06/28/2015 x-ray of the chest FINDINGS: Sagittal images of the spine shows osteopenia and degenerative changes thoracic spine. There is dextroscoliosis and mild kyphosis of thoracic spine. Extensive atherosclerotic calcifications of thoracic aorta. Atherosclerotic calcifications of coronary arteries. Study is limited without IV  contrast. A precarinal lymph node measures 9 mm short-axis. No significant hilar adenopathy. Central airways are patent. There is cardiomegaly. Bilateral small pleural effusion. There is right base atelectasis. Left base atelectasis or infiltrate. As noted on chest x-ray there is a nodular lesion in right lower lobe posteriorly. This is best visualized in sagittal image 20 measures 1.6 by 1.5 cm. Malignancy cannot be excluded. Further correlation with PET scan and/or biopsy is recommended. There is a nodule in superior segment of right lower lobe measures 8 mm. Second nodule in superior segment of right lower lobe measures 6 mm. A nodule in right upper lobe laterally just anterior to the fissure measures 7 mm. Metastatic disease cannot be excluded. There is a pleural-based nodule in left upper lobe posteriorly measures 8 mm. There is no pulmonary edema. Atherosclerotic calcifications of abdominal aorta. No adrenal gland mass is noted in visualized upper abdomen. Atherosclerotic calcifications of splenic artery. IMPRESSION: 1. As noted on chest x-ray there is a nodular lesion in right lower lobe posteriorly. This is best seen in sagittal image 20 measures 1.6 by 1.5 cm. Malignancy cannot be excluded. Further correlation with PET scan and/or biopsy is recommended. 2. No mediastinal or hilar adenopathy. 3. Additional smaller nodules are noted bilaterally. Metastatic disease cannot be excluded. 4. Atherosclerotic calcifications of thoracic and abdominal aorta. Atherosclerotic calcifications of coronary arteries. 5. Degenerative changes thoracic spine. 6. Bilateral small pleural effusion. There is atelectasis in right lower lobe posteriorly. Atelectasis or infiltrate in left lower lobe posteriorly. 7. Cardiomegaly is noted. Electronically Signed   By: Lahoma Crocker M.D.   On: 06/28/2015 11:47   US Renal  06/28/2015  CLINICAL DATA:  Acute renal failure, history of bladder cancer EXAM: RENAL / URINARY TRACT ULTRASOUND  COMPLETE COMPARISON:  06/25/2012 Ho FINDINGS: Right Kidney: Length: 8.9 cm. Echogenicity within normal limits. No mass or hydronephrosis visualized. Left Kidney: Length: 10.2  cm.  Mild left hydronephrosis. No left renal calculus. Bladder: No urinary jets are visualized. There is a lesion in left posterior aspect of the urinary bladder measures 5 by 3.9 cm. This is probable the known bladder mass. IMPRESSION: 1. Unremarkable right kidney. 2. There is very mild left hydronephrosis.  No left renal calculus. 3. There is a lesion/filling defect within left aspect of urinary bladder probable the known mass. Measures at least 5 x 3.9 x 4.5 cm. Clinical correlation is necessary. Electronically Signed   By: Lahoma Crocker M.D.   On: 06/28/2015 12:39    Review of Systems  Respiratory: Positive for cough and shortness of breath. Negative for sputum production.   Cardiovascular: Positive for leg swelling.  Gastrointestinal: Negative for nausea and vomiting.       Poor appetite the las couple of weeks  Neurological: Positive for weakness.   Blood pressure 185/69, pulse 65, temperature 98 F (36.7 C), temperature source Oral, resp. rate 18, height 5' 3"  (1.6 m), weight 136 lb 7.4 oz (61.9 kg), SpO2 97 %. Physical Exam  Constitutional: She is oriented to person, place, and time. No distress.  Eyes: No scleral icterus.  Neck: No JVD present.  Cardiovascular: Normal rate and regular rhythm.   Respiratory: She has no wheezes. She has no rales.  GI: There is no tenderness. There is no rebound.  Musculoskeletal: She exhibits no edema.  Neurological: She is alert and oriented to person, place, and time.    Assessment/Plan: Problem #1 acute kidney injury superimposed on chronic presently her BUN and creatinine has increased above her baseline. Patient has been taking diuretics for leg swelling. She has poor appetite recently. The present increasing BUN and creatinine could be secondary to prerenal syndrome versus  ATN. Problem #2 chronic renal failure: Creatinine has been ranging between 1.2-1.5 on 10/2009, 1.5 on 4/29 2015, 1.2 to but fluctuating in January/2016 and about 3 months ago her creatinine was 1.38 with a GFR of 34 mL/m. Hence underlying stage III chronic renal failure. Patient with bladder cancer status post resection. Ultrasound of the kidneys showed right kidney to be 8.9 cm left 110.2 cm. Hence and equal kidney most likely from previous infection  and or obstruction. Presently patient with bladder mass and showed mild left hydronephrosis. His etiology could be focal segmental nephrosclerosis/ischemic/recurrent acute kidney injury and age related renal function loss. Problem #3 exertional dyspnea: Etiology not clear. Possibly cardiac/bilateral small pleural effusion. Patient doesn't show significant sign of fluid overload. Problem #4 coronary artery disease: Patient doesn't have any chest pain Problem #5 history of aortic stenosis and mitral prolapse Problem #6 of atrial fibrillation: Her heart rate is controlled Problem #7 anemia Plan: Will hydrate patient slowly and follow her blood work. We'll check her basic metabolic panel in the morning. We'll check intact PTH and vitamin D level.  Nimrit Kehres S 06/28/2015, 4:06 PM

## 2015-06-28 NOTE — Progress Notes (Signed)
Skin tear to right elbow while moving patient from stretcher to CT table. Tegaderm placed on area, patient aware.

## 2015-06-28 NOTE — ED Provider Notes (Signed)
CSN: [id number]     Arrival date & time 06/28/15  [id number] History  By signing my name below, I, Meriel Pica, attest that this documentation has been prepared under the direction and in the presence of Ezequiel Essex, MD. Electronically Signed: Meriel Pica, ED Scribe. 06/28/2015. 9:41 AM.   Chief Complaint  Patient presents with  . Shortness of Breath   The history is provided by the patient. No language interpreter was used.   HPI Comments: Tracy [name] is a 81 y.o. female, with a PMhx of bladder cancer, HLD, CAD, Afib, and CHF on Furosemide, who presents to the Emergency Department complaining of SOB with exertion onset 1 day ago that gradually worsened yesterday and persisted throughout this morning. She reports associated intermittent CP which lasts for several seconds at onset and then resolves and which she is not experiencing currently. She has a h/o CHF and takes Furosemide daily and denies missing any doses. Denies orthopnea. The pt also has a h/o of Afib and was on blood thinning medication but was taken off this medication by her urologist Dr. Tresa Moore due to hematuria. She is followed by Dr. Julianne Handler with cardiology in [place] and has an appointment with him in 3 months. Pt has a history of bladder cancer and has undergone several transurethral resections, however she has not received any chemotherapy. Denies increased intake in salt, recent medication changes, cough, chest tightness, recent illness, nausea, vomiting, abdominal pain, hematuria, fevers, chills and diaphoresis.  Past Medical History  Diagnosis Date  . Hyperlipidemia   . Coronary artery disease     Nonobstructive at cardiac catheterizatio 2003  . Venous insufficiency   . Diastolic heart failure ([place])   . GERD (gastroesophageal reflux disease)   . Mitral valve prolapse   . Shoulder pain, right   . Headache   . Arthritis   . Sleeping difficulties   . Atrial fibrillation ([place])   . Bladder cancer ([place])   .  History of blood transfusion 02/2015    Received 1 unit PRBCs d/t hemorrhage in bladder s/p TUBRT and warfarin  . Aortic stenosis     Mild   Past Surgical History  Procedure Laterality Date  . Colonoscopy N/A 09/30/2012    Procedure: COLONOSCOPY;  Surgeon: Rogene Houston, MD;  Location: [facility];  Service: Endoscopy;  Laterality: N/A;  930  . Cardioversion      x2  . Back surgery      x2-lower back  . Transurethral resection of bladder tumor with gyrus (turbt-gyrus) N/A 07/19/2014    Procedure: TRANSURETHRAL RESECTION OF BLADDER TUMOR WITH GYRUS (TURBT-GYRUS) WITH CYSTOGRAM;  Surgeon: Alexis Frock, MD;  Location: [facility];  Service: Urology;  Laterality: N/A;  . Transurethral resection of bladder tumor with gyrus (turbt-gyrus) N/A 07/20/2014    Procedure: SECOND LOOK TRANSURETHRAL RESECTION OF BLADDER TUMOR WITH GYRUS (TURBT-GYRUS);  Surgeon: Alexis Frock, MD;  Location: [facility];  Service: Urology;  Laterality: N/A;  . Cataract extraction    . Transurethral resection of bladder tumor with gyrus (turbt-gyrus) N/A 12/20/2014    Procedure: TRANSURETHRAL RESECTION OF BLADDER TUMOR ;  Surgeon: Alexis Frock, MD;  Location: [facility];  Service: Urology;  Laterality: N/A;  . Cystoscopy w/ retrogrades Bilateral 12/20/2014    Procedure: CYSTOSCOPY WITH RETROGRADE PYELOGRAM;  Surgeon: Alexis Frock, MD;  Location: [facility];  Service: Urology;  Laterality: Bilateral;  . Transurethral resection of bladder tumor with gyrus (turbt-gyrus) N/A 05/03/2015    Procedure: TRANSURETHRAL RESECTION OF BLADDER TUMOR;  Surgeon: Alexis Frock, MD;  Location: [facility];  Service: Urology;  Laterality: N/A;  . Cystoscopy w/ retrogrades Bilateral 05/03/2015    Procedure: CYSTOSCOPY WITH RETROGRADE PYELOGRAM;  Surgeon: Alexis Frock, MD;  Location[facility];  Service: Urology;  Laterality: Bilateral;   Family History  Problem Relation Age of Onset  . Colon cancer Neg Hx   . Colon  polyps Sister    Social History  Substance Use Topics  . Smoking status: Never Smoker   . Smokeless tobacco: Never Used  . Alcohol Use: No   OB History    No data available     Review of Systems  Constitutional: Negative for fever, chills and diaphoresis.  HENT: Negative for congestion and rhinorrhea.   Respiratory: Positive for shortness of breath. Negative for cough and chest tightness.   Cardiovascular: Positive for chest pain.  Gastrointestinal: Negative for nausea, vomiting and abdominal pain.  Genitourinary: Negative for hematuria.  Hematological: Does not bruise/bleed easily.  A complete 10 system review of systems was obtained and is otherwise negative except at noted in the HPI and PMH.  Allergies  Amoxicillin; Penicillins; and Sulfonamide derivatives  Home Medications   Prior to Admission medications   Medication Sig Start Date End Date Taking? Authorizing Provider  acetaminophen (TYLENOL) 500 MG tablet Take 500 mg by mouth every 6 (six) hours as needed for pain.   Yes Historical Provider, MD  calcium carbonate (OS-CAL) 600 MG TABS Take 600 mg by mouth daily at 12 noon.    Yes Historical Provider, MD  diltiazem (CARDIZEM CD) 120 MG 24 hr capsule TAKE (1) CAPSULE DAILY 01/23/15  Yes Burnell Blanks, MD  docusate sodium 100 MG CAPS Take 100 mg by mouth 2 (two) times daily. Patient taking differently: Take 100 mg by mouth daily as needed (stool softner).  07/22/14  Yes Franchot Gallo, MD  furosemide (LASIX) 40 MG tablet Take 1 tablet (40 mg total) by mouth daily. 08/23/14  Yes Marjean Donna, MD  meclizine (ANTIVERT) 12.5 MG tablet Take 12.5 mg by mouth 3 (three) times daily as needed for dizziness.    Yes Historical Provider, MD  metoprolol tartrate (LOPRESSOR) 25 MG tablet Take 1 tablet (25 mg total) by mouth 2 (two) times daily. 04/04/15  Yes Burnell Blanks, MD  Multiple Vitamins-Minerals (MULTIVITAMINS THER. W/MINERALS) TABS Take 1 tablet by mouth daily at 12  noon.    Yes Historical Provider, MD  rosuvastatin (CRESTOR) 20 MG tablet Take 20 mg by mouth daily after lunch.    Yes Historical Provider, MD   BP 183/66 mmHg  Pulse 80  Resp 18  SpO2 99% Physical Exam  Constitutional: She is oriented to person, place, and time. She appears well-developed and well-nourished. No distress.  HENT:  Head: Normocephalic and atraumatic.  Mouth/Throat: Oropharynx is clear and moist. No oropharyngeal exudate.  Eyes: Conjunctivae and EOM are normal. Pupils are equal, round, and reactive to light.  Neck: Normal range of motion. Neck supple.  No meningismus.  Cardiovascular: Normal rate, normal heart sounds and intact distal pulses.  An irregular rhythm present.  No murmur heard. Irregular rhythm.   Pulmonary/Chest: Effort normal. No respiratory distress. She has decreased breath sounds in the right lower field and the left lower field.  Mild dyspnea with conversation. Decreased breath sounds at the bases. No peripheral edema.   Abdominal: Soft. There is no tenderness. There is no rebound and no guarding.  Musculoskeletal: Normal range of motion. She exhibits  no edema or tenderness.  Neurological: She is alert and oriented to person, place, and time. No cranial nerve deficit. She exhibits normal muscle tone. Coordination normal.  No ataxia on finger to nose bilaterally. No pronator drift. 5/5 strength throughout. CN 2-12 intact.Equal grip strength. Sensation intact.   Skin: Skin is warm.  Psychiatric: She has a normal mood and affect. Her behavior is normal.  Nursing note and vitals reviewed.   ED Course  Procedures  .DIAGNOSTIC STUDIES: Oxygen Saturation is 99% on RA, normal by my interpretation.    COORDINATION OF CARE: 9:34 AM Discussed treatment plan which includes to order cardiac workup with pt. Pt acknowledges and agrees to plan.   Labs Review Labs Reviewed  CBC WITH DIFFERENTIAL/PLATELET - Abnormal; Notable for the following:    Hemoglobin 10.6  (*)    HCT 34.3 (*)    MCH 25.9 (*)    RDW 20.1 (*)    Platelets 142 (*)    All other components within normal limits  BASIC METABOLIC PANEL - Abnormal; Notable for the following:    Glucose, Bld 149 (*)    BUN 74 (*)    Creatinine, Ser 3.80 (*)    GFR calc non Af Amer 10 (*)    GFR calc Af Amer 12 (*)    All other components within normal limits  TROPONIN I - Abnormal; Notable for the following:    Troponin I 0.06 (*)    All other components within normal limits  BRAIN NATRIURETIC PEPTIDE - Abnormal; Notable for the following:    B Natriuretic Peptide 998.0 (*)    All other components within normal limits  TROPONIN I - Abnormal; Notable for the following:    Troponin I 0.06 (*)    All other components within normal limits  URINALYSIS, ROUTINE W REFLEX MICROSCOPIC (NOT AT [facility])  SODIUM, URINE, RANDOM  TROPONIN I  TROPONIN I    Imaging Review Dg Chest 2 View  06/28/2015  CLINICAL DATA:  Shortness of Breath EXAM: CHEST  2 VIEW COMPARISON:  12/15/2014 FINDINGS: Cardiomegaly again noted. Central mild vascular congestion without convincing pulmonary edema. Osteopenia and degenerative changes thoracic spine. Bilateral small pleural effusion with bilateral basilar atelectasis left greater than right. There is poorly visualized nodular density right base measures 2 cm. A lung nodule cannot be excluded. Further correlation with CT scan of the chest is recommended. Atherosclerotic calcifications of thoracic aorta. IMPRESSION: Central mild vascular congestion without convincing pulmonary edema. Osteopenia and degenerative changes thoracic spine. Bilateral small pleural effusion with bilateral basilar atelectasis left greater than right. There is poorly visualized nodular density right base measures 2 cm. A lung nodule cannot be excluded. Further correlation with CT scan of the chest is recommended. Electronically Signed   By: Lahoma Crocker M.D.   On: 06/28/2015 09:52   Ct Chest Wo  Contrast  06/28/2015  CLINICAL DATA:  Abnormal chest x-ray EXAM: CT CHEST WITHOUT CONTRAST TECHNIQUE: Multidetector CT imaging of the chest was performed following the standard protocol without IV contrast. COMPARISON:  06/28/2015 x-ray of the chest FINDINGS: Sagittal images of the spine shows osteopenia and degenerative changes thoracic spine. There is dextroscoliosis and mild kyphosis of thoracic spine. Extensive atherosclerotic calcifications of thoracic aorta. Atherosclerotic calcifications of coronary arteries. Study is limited without IV contrast. A precarinal lymph node measures 9 mm short-axis. No significant hilar adenopathy. Central airways are patent. There is cardiomegaly. Bilateral small pleural effusion. There is right base atelectasis. Left base atelectasis or infiltrate. As  noted on chest x-ray there is a nodular lesion in right lower lobe posteriorly. This is best visualized in sagittal image 20 measures 1.6 by 1.5 cm. Malignancy cannot be excluded. Further correlation with PET scan and/or biopsy is recommended. There is a nodule in superior segment of right lower lobe measures 8 mm. Second nodule in superior segment of right lower lobe measures 6 mm. A nodule in right upper lobe laterally just anterior to the fissure measures 7 mm. Metastatic disease cannot be excluded. There is a pleural-based nodule in left upper lobe posteriorly measures 8 mm. There is no pulmonary edema. Atherosclerotic calcifications of abdominal aorta. No adrenal gland mass is noted in visualized upper abdomen. Atherosclerotic calcifications of splenic artery. IMPRESSION: 1. As noted on chest x-ray there is a nodular lesion in right lower lobe posteriorly. This is best seen in sagittal image 20 measures 1.6 by 1.5 cm. Malignancy cannot be excluded. Further correlation with PET scan and/or biopsy is recommended. 2. No mediastinal or hilar adenopathy. 3. Additional smaller nodules are noted bilaterally. Metastatic disease  cannot be excluded. 4. Atherosclerotic calcifications of thoracic and abdominal aorta. Atherosclerotic calcifications of coronary arteries. 5. Degenerative changes thoracic spine. 6. Bilateral small pleural effusion. There is atelectasis in right lower lobe posteriorly. Atelectasis or infiltrate in left lower lobe posteriorly. 7. Cardiomegaly is noted. Electronically Signed   By: Lahoma Crocker M.D.   On: 06/28/2015 11:47   US Renal  06/28/2015  CLINICAL DATA:  Acute renal failure, history of bladder cancer EXAM: RENAL / URINARY TRACT ULTRASOUND COMPLETE COMPARISON:  06/25/2012 Ho FINDINGS: Right Kidney: Length: 8.9 cm. Echogenicity within normal limits. No mass or hydronephrosis visualized. Left Kidney: Length: 10.2 cm.  Mild left hydronephrosis. No left renal calculus. Bladder: No urinary jets are visualized. There is a lesion in left posterior aspect of the urinary bladder measures 5 by 3.9 cm. This is probable the known bladder mass. IMPRESSION: 1. Unremarkable right kidney. 2. There is very mild left hydronephrosis.  No left renal calculus. 3. There is a lesion/filling defect within left aspect of urinary bladder probable the known mass. Measures at least 5 x 3.9 x 4.5 cm. Clinical correlation is necessary. Electronically Signed   By: Lahoma Crocker M.D.   On: 06/28/2015 12:39   I have personally reviewed and evaluated these images and lab results as part of my medical decision-making.   EKG Interpretation   Date/Time:  Thursday June 28 2015 09:13:11 EST Ventricular Rate:  77 PR Interval:    QRS Duration: 84 QT Interval:  379 QTC Calculation: 429 R Axis:   73 Text Interpretation:  Atrial fibrillation Ventricular premature complex  LVH with secondary repolarization abnormality Nonspecific ST abnormality  lateral T wave inversions Confirmed by Wyvonnia Dusky  MD, Estel Tonelli [phone]) on  06/28/2015 9:35:10 AM      MDM   Final diagnoses:  Acute diastolic CHF (congestive heart failure) (HCC)  Acute renal  failure, unspecified acute renal failure type (HCC)   worsening shortness of breath since yesterday that is exertional. No cough or fever. She endorses compliance with medications.  history of CAD, chronic atrial fibrillation managed with rate control medications no longer on Coumadin, diastolic CHF, HTN and hyperlipidemia. She's had non-obstructive CAD by catheterization in 2003 with 40% mid LAD, plaque disease in other vessels. Echo August 2013 normal LV function, mild MR, Mild AI, mild AS, mild TR.  Chest x-ray shows mild  interstitial edema and effusions. Questionable lung nodule. BMP 998 with minimal troponin elevation.  EKG is unchanged. Creatinine is significantly worse at 3.8 Bladder scan with 133 ml.  Plan admission for dyspnea with renal failure.  CT with concerning lung nodule, possible malignancy. D/w Dr. Waldron Labs.   I personally performed the services described in this documentation, which was scribed in my presence. The recorded information has been reviewed and is accurate.     Ezequiel Essex, MD 06/28/15 [phone]

## 2015-06-28 NOTE — ED Notes (Signed)
Pt states SOB began yesterday. Pt chose to walk to room from waiting area and did so with no difficulty.  Pt is able to speak complete sentences without difficulty. Denies pain. No edema noted.

## 2015-06-28 NOTE — ED Notes (Addendum)
Pt tolerated ambulation well with walker and o2 sats staying at 96% on room air and 82 hr.

## 2015-06-28 NOTE — ED Notes (Addendum)
Pt returned from CT with skin tear on right elbow.  Tegaderm applied by CT tech.  Blood oozing through corner of tegaderm.  Temporary dressing applied as wound will need to be assessed on admission.

## 2015-06-29 ENCOUNTER — Inpatient Hospital Stay (HOSPITAL_COMMUNITY): Payer: Medicare Other

## 2015-06-29 DIAGNOSIS — R0609 Other forms of dyspnea: Secondary | ICD-10-CM

## 2015-06-29 DIAGNOSIS — R0600 Dyspnea, unspecified: Secondary | ICD-10-CM

## 2015-06-29 DIAGNOSIS — I350 Nonrheumatic aortic (valve) stenosis: Secondary | ICD-10-CM

## 2015-06-29 DIAGNOSIS — E785 Hyperlipidemia, unspecified: Secondary | ICD-10-CM

## 2015-06-29 LAB — BASIC METABOLIC PANEL
Anion gap: 9 (ref 5–15)
BUN: 68 mg/dL — ABNORMAL HIGH (ref 6–20)
CHLORIDE: 107 mmol/L (ref 101–111)
CO2: 26 mmol/L (ref 22–32)
CREATININE: 3.53 mg/dL — AB (ref 0.44–1.00)
Calcium: 9.1 mg/dL (ref 8.9–10.3)
GFR calc non Af Amer: 11 mL/min — ABNORMAL LOW (ref >60)
GFR, EST AFRICAN AMERICAN: 13 mL/min — AB (ref >60)
Glucose, Bld: 119 mg/dL — ABNORMAL HIGH (ref 65–99)
POTASSIUM: 4.3 mmol/L (ref 3.5–5.1)
Sodium: 142 mmol/L (ref 135–145)

## 2015-06-29 LAB — URINE MICROSCOPIC-ADD ON: Squamous Epithelial / LPF: NONE SEEN

## 2015-06-29 LAB — CBC
HEMATOCRIT: 34.8 % — AB (ref 36.0–46.0)
HEMOGLOBIN: 11.1 g/dL — AB (ref 12.0–15.0)
MCH: 26.5 pg (ref 26.0–34.0)
MCHC: 31.9 g/dL (ref 30.0–36.0)
MCV: 83.1 fL (ref 78.0–100.0)
PLATELETS: 157 10*3/uL (ref 150–400)
RBC: 4.19 MIL/uL (ref 3.87–5.11)
RDW: 20.3 % — ABNORMAL HIGH (ref 11.5–15.5)
WBC: 5.5 10*3/uL (ref 4.0–10.5)

## 2015-06-29 LAB — URINALYSIS, ROUTINE W REFLEX MICROSCOPIC
Bilirubin Urine: NEGATIVE
Glucose, UA: NEGATIVE mg/dL
Ketones, ur: NEGATIVE mg/dL
NITRITE: POSITIVE — AB
PROTEIN: 100 mg/dL — AB
Specific Gravity, Urine: 1.015 (ref 1.005–1.030)
pH: 7 (ref 5.0–8.0)

## 2015-06-29 LAB — GLUCOSE, CAPILLARY
GLUCOSE-CAPILLARY: 98 mg/dL (ref 65–99)
GLUCOSE-CAPILLARY: 100 mg/dL — AB (ref 65–99)

## 2015-06-29 LAB — PTH, INTACT AND CALCIUM
Calcium, Total (PTH): 9.1 mg/dL (ref 8.7–10.3)
PTH: 108 pg/mL — AB (ref 15–65)

## 2015-06-29 LAB — SODIUM, URINE, RANDOM: SODIUM UR: 54 mmol/L

## 2015-06-29 LAB — CREATININE, URINE, RANDOM: Creatinine, Urine: 98.43 mg/dL

## 2015-06-29 LAB — TROPONIN I: TROPONIN I: 0.06 ng/mL — AB (ref <0.031)

## 2015-06-29 MED ORDER — DILTIAZEM HCL ER COATED BEADS 180 MG PO CP24
180.0000 mg | ORAL_CAPSULE | Freq: Every day | ORAL | Status: DC
  Administered 2015-06-30: 180 mg via ORAL
  Filled 2015-06-29: qty 1

## 2015-06-29 MED ORDER — CLONIDINE HCL 0.2 MG PO TABS
0.2000 mg | ORAL_TABLET | Freq: Two times a day (BID) | ORAL | Status: DC
  Administered 2015-06-29 – 2015-06-30 (×3): 0.2 mg via ORAL
  Filled 2015-06-29 (×3): qty 1

## 2015-06-29 NOTE — Care Management Important Message (Signed)
Important Message  Patient Details  Name: Tracy [name] MRN: [id number] Date of Birth: 1928-03-10   Medicare Important Message Given:  Yes    Joylene Draft, RN 06/29/2015, 11:49 AM

## 2015-06-29 NOTE — Progress Notes (Signed)
Patient with somewhat lessened dyspnea then admission with acute renal failure probably due to ATN creatinine improving slightly off anticoagulation due to bladder carcinoma as an outpatient 2-D echo pending at the time of this dictation had normal systolic function in September Tracy [name] [id number] DOB: 12/13/1927 DOA: 06/28/2015 PCP: [name], MD             Physical Exam: Blood pressure 195/87, pulse 87, temperature 98 F (36.7 C), temperature source Oral, resp. rate 20, height 5\' 3"  (1.6 m), weight 136 lb 7.4 oz (61.9 kg), SpO2 95 %. neck no JVD no carotid bruits no thyromegaly lungs diminished breath sounds in the bases prolonged history phase no rales no wheezes rhonchi appreciable heart irregular regular no S3 auscultated 1/6 aortic outflow murmur no heaves thrills or rubs from his 1+ chronic edema   Investigations:  No results found for this or any previous visit (from the past 240 hour(s)).   Basic Metabolic Panel:  Recent Labs  06/28/15 0917 06/28/15 1645 06/29/15 0610  NA 143  --  142  K 4.1  --  4.3  CL 107  --  107  CO2 28  --  26  GLUCOSE 149*  --  119*  BUN 74*  --  68*  CREATININE 3.80*  --  3.53*  CALCIUM 9.5 9.1 9.1   Liver Function Tests: No results for input(s): AST, ALT, ALKPHOS, BILITOT, PROT, ALBUMIN in the last 72 hours.   CBC:  Recent Labs  06/28/15 0917 06/29/15 0610  WBC 5.3 5.5  NEUTROABS 4.0  --   HGB 10.6* 11.1*  HCT 34.3* 34.8*  MCV 83.7 83.1  PLT 142* 157    Dg Chest 2 View  06/28/2015  CLINICAL DATA:  Shortness of Breath EXAM: CHEST  2 VIEW COMPARISON:  12/15/2014 FINDINGS: Cardiomegaly again noted. Central mild vascular congestion without convincing pulmonary edema. Osteopenia and degenerative changes thoracic spine. Bilateral small pleural effusion with bilateral basilar atelectasis left greater than right. There is poorly visualized nodular density right base measures 2 cm. A lung nodule cannot be excluded.  Further correlation with CT scan of the chest is recommended. Atherosclerotic calcifications of thoracic aorta. IMPRESSION: Central mild vascular congestion without convincing pulmonary edema. Osteopenia and degenerative changes thoracic spine. Bilateral small pleural effusion with bilateral basilar atelectasis left greater than right. There is poorly visualized nodular density right base measures 2 cm. A lung nodule cannot be excluded. Further correlation with CT scan of the chest is recommended. Electronically Signed   By: Lahoma Crocker M.D.   On: 06/28/2015 09:52   Ct Chest Wo Contrast  06/28/2015  CLINICAL DATA:  Abnormal chest x-ray EXAM: CT CHEST WITHOUT CONTRAST TECHNIQUE: Multidetector CT imaging of the chest was performed following the standard protocol without IV contrast. COMPARISON:  06/28/2015 x-ray of the chest FINDINGS: Sagittal images of the spine shows osteopenia and degenerative changes thoracic spine. There is dextroscoliosis and mild kyphosis of thoracic spine. Extensive atherosclerotic calcifications of thoracic aorta. Atherosclerotic calcifications of coronary arteries. Study is limited without IV contrast. A precarinal lymph node measures 9 mm short-axis. No significant hilar adenopathy. Central airways are patent. There is cardiomegaly. Bilateral small pleural effusion. There is right base atelectasis. Left base atelectasis or infiltrate. As noted on chest x-ray there is a nodular lesion in right lower lobe posteriorly. This is best visualized in sagittal image 20 measures 1.6 by 1.5 cm. Malignancy cannot be excluded. Further correlation with PET scan and/or biopsy is recommended. There is  a nodule in superior segment of right lower lobe measures 8 mm. Second nodule in superior segment of right lower lobe measures 6 mm. A nodule in right upper lobe laterally just anterior to the fissure measures 7 mm. Metastatic disease cannot be excluded. There is a pleural-based nodule in left upper lobe  posteriorly measures 8 mm. There is no pulmonary edema. Atherosclerotic calcifications of abdominal aorta. No adrenal gland mass is noted in visualized upper abdomen. Atherosclerotic calcifications of splenic artery. IMPRESSION: 1. As noted on chest x-ray there is a nodular lesion in right lower lobe posteriorly. This is best seen in sagittal image 20 measures 1.6 by 1.5 cm. Malignancy cannot be excluded. Further correlation with PET scan and/or biopsy is recommended. 2. No mediastinal or hilar adenopathy. 3. Additional smaller nodules are noted bilaterally. Metastatic disease cannot be excluded. 4. Atherosclerotic calcifications of thoracic and abdominal aorta. Atherosclerotic calcifications of coronary arteries. 5. Degenerative changes thoracic spine. 6. Bilateral small pleural effusion. There is atelectasis in right lower lobe posteriorly. Atelectasis or infiltrate in left lower lobe posteriorly. 7. Cardiomegaly is noted. Electronically Signed   By: Lahoma Crocker M.D.   On: 06/28/2015 11:47   US Renal  06/28/2015  CLINICAL DATA:  Acute renal failure, history of bladder cancer EXAM: RENAL / URINARY TRACT ULTRASOUND COMPLETE COMPARISON:  06/25/2012 Ho FINDINGS: Right Kidney: Length: 8.9 cm. Echogenicity within normal limits. No mass or hydronephrosis visualized. Left Kidney: Length: 10.2 cm.  Mild left hydronephrosis. No left renal calculus. Bladder: No urinary jets are visualized. There is a lesion in left posterior aspect of the urinary bladder measures 5 by 3.9 cm. This is probable the known bladder mass. IMPRESSION: 1. Unremarkable right kidney. 2. There is very mild left hydronephrosis.  No left renal calculus. 3. There is a lesion/filling defect within left aspect of urinary bladder probable the known mass. Measures at least 5 x 3.9 x 4.5 cm. Clinical correlation is necessary. Electronically Signed   By: Lahoma Crocker M.D.   On: 06/28/2015 12:39      Medications:   Impression:  Principal Problem:    Acute renal failure (ARF) (HCC) Active Problems:   Dyslipidemia   Essential hypertension   ATRIAL FIBRILLATION, CHRONIC   Acute diastolic heart failure-(recurrent)   Bladder cancer- surgery 07/19/14   Elevated troponin     Plan: Add clonidine 0.1 mg by mouth twice a day for antihypertensive control. Monitor renal function and electrolytes daily continue heparin 3 times a day  Consultants: Cardiology and nephrology    Procedures   Antibiotics:                   Code Status:  Family Communication:  Spoke with patient alone  Disposition Plan see plan above  Time spent: 30 minutes   LOS: 1 day   Danniela Mcbrearty M   06/29/2015, 12:33 PM

## 2015-06-29 NOTE — Clinical Documentation Improvement (Signed)
Internal Medicine  Please clarify if the following diagnosis,  Acute on Chronic diastolic heart failure   was:   Present at the time of admission (POA)  NOT present at the time of admission and it developed during the inpatient stay  Unable to clinically determine whether the condition was present on admission.  Unknown   Supporting Information:   [id number].Marland KitchenMarland KitchenMarland Kitchenworkup in ED significant for elevated BNP, and volume overload on chest x-ray  Treatment:   IV Lasxi 20mg  given in ED     Please exercise your independent, professional judgment when responding. A specific answer is not anticipated or expected.   Thank You,  [place] [phone]

## 2015-06-29 NOTE — Progress Notes (Signed)
Subjective: Patient is feeling slightly better. She doesn't have any nausea or vomiting. But still with some exertional dyspnea.   Objective: Vital signs in last 24 hours: Temp:  [98 F (36.7 C)-98.4 F (36.9 C)] 98 F (36.7 C) (12/09 0451) Pulse Rate:  [65-89] 87 (12/09 0451) Resp:  [15-20] 20 (12/09 0451) BP: (162-195)/(66-89) 195/87 mmHg (12/09 0451) SpO2:  [88 %-100 %] 95 % (12/09 0451) Weight:  [136 lb 7.4 oz (61.9 kg)] 136 lb 7.4 oz (61.9 kg) (12/08 1337)  Intake/Output from previous day: 12/08 0701 - 12/09 0700 In: 783.8 [P.O.:480; I.V.:303.8] Out: 200 [Urine:200] Intake/Output this shift: Total I/O In: 3 [I.V.:3] Out: -    Recent Labs  06/28/15 0917 06/29/15 0610  HGB 10.6* 11.1*    Recent Labs  06/28/15 0917 06/29/15 0610  WBC 5.3 5.5  RBC 4.10 4.19  HCT 34.3* 34.8*  PLT 142* 157    Recent Labs  06/28/15 0917 06/29/15 0610  NA 143 142  K 4.1 4.3  CL 107 107  CO2 28 26  BUN 74* 68*  CREATININE 3.80* 3.53*  GLUCOSE 149* 119*  CALCIUM 9.5 9.1   No results for input(s): LABPT, INR in the last 72 hours.  Generally patient is alert in no apparent distress. Doesn't use she is on mother oxygen. Chest: Decreased breath sound bilaterally Heart exam revealed irregular rate and rhythm no murmur Extremities no edema  Assessment/Plan: Problem #1 acute kidney injury: Presently her BUN and creatinine is improving. Patient is asymptomatic. Problem #2 chronic renal failure stage III Problem #3 Atrovent fibrillation: Her heart rate is controlled Problem #4 history of bladder cancer colon status post surgery. Presently she has also had a bladder mass Problem #5 history of aortic stenosis Problem #6 history of difficulty breathing: Possibly multifactorial. Presently she is on oxygen and feeling better. Problem #7 anemia. Problem #8 hypertension: Her blood pressure is fluctuating. Seems to be high this morning. Plan: We'll continue his present  management We'll check a basic metabolic panel in the morning.   Tracy [name] S 06/29/2015, 8:12 AM

## 2015-06-29 NOTE — Care Management Note (Signed)
Case Management Note  Patient Details  Name: Tracy [name] MRN: [id number] Date of Birth: 01/21/1928  Subjective/Objective:                  Pt admitted from home with ARF. Pt lives alone and will return home at discharge. Pt has a walker for home use. Pt stated her daughter and grandson are a big source of support and assist pt with transportation needs. Pt has used [facility] in the past.  Action/Plan: CM educated pt on diet, medication compliance, and daily weights. Pt verbalized understanding. If pt needs home health services at discharge, please arrange with agency of choice.  Expected Discharge Date:                  Expected Discharge Plan:  Home/Self Care  In-House Referral:  NA  Discharge planning Services  CM Consult  Post Acute Care Choice:  NA Choice offered to:  NA  DME Arranged:    DME Agency:     HH Arranged:    HH Agency:     Status of Service:  Completed, signed off  Medicare Important Message Given:  Yes Date Medicare IM Given:    Medicare IM give by:    Date Additional Medicare IM Given:    Additional Medicare Important Message give by:     If discussed at [place] of Stay Meetings, dates discussed:    Additional Comments:  Joylene Draft, RN 06/29/2015, 11:50 AM

## 2015-06-29 NOTE — Progress Notes (Signed)
Notified Dr. Cindie Laroche @ [phone] about pt. Recurring bradycardia= 34-37 HR, as well as pauses ranging from 3-4 seconds per CCMD.   HR is around mid 60's at other times.

## 2015-06-29 NOTE — Progress Notes (Signed)
Consulting cardiologist: Kate Sable MD Primary Cardiologist: Lauree Chandler MD  Cardiology Specific Problem List: 1. Chest Pain 2. Atrial fibrillation-No anticoagulation 3. Non-obstructive CAD 4.Troponin Elevation   Subjective:    No chest pain. Complaints of back pain. Breathing improved.   Objective:   Temp:  [98 F (36.7 C)-98.4 F (36.9 C)] 98 F (36.7 C) (12/09 0451) Pulse Rate:  [65-89] 87 (12/09 0451) Resp:  [16-20] 20 (12/09 0451) BP: (185-195)/(69-89) 195/87 mmHg (12/09 0451) SpO2:  [88 %-100 %] 95 % (12/09 0451) Weight:  [136 lb 7.4 oz (61.9 kg)] 136 lb 7.4 oz (61.9 kg) (12/08 1337) Last BM Date: 06/27/15  Filed Weights   06/28/15 1337  Weight: 136 lb 7.4 oz (61.9 kg)    Intake/Output Summary (Last 24 hours) at 06/29/15 1017 Last data filed at 06/29/15 [id number]  Gross per 24 hour  Intake 786.75 ml  Output    200 ml  Net 586.75 ml    Telemetry: Atrial fib with frequent PVCs  Exam:  General: No acute distress.  HEENT: Conjunctiva and lids normal, oropharynx clear.  Lungs: Clear to auscultation, nonlabored. Minor bibasilar crackles.   Cardiac: No elevated JVP or bruits. IRRR, no gallop or rub.   Abdomen: Normoactive bowel sounds, nontender, nondistended.  Extremities: No pitting edema, distal pulses full.  Neuropsychiatric: Alert and oriented x3, affect appropriate.   Lab Results:  Basic Metabolic Panel:  Recent Labs Lab 06/28/15 0917 06/28/15 1645 06/29/15 0610  NA 143  --  142  K 4.1  --  4.3  CL 107  --  107  CO2 28  --  26  GLUCOSE 149*  --  119*  BUN 74*  --  68*  CREATININE 3.80*  --  3.53*  CALCIUM 9.5 9.1 9.1    CBC:  Recent Labs Lab 06/28/15 0917 06/29/15 0610  WBC 5.3 5.5  HGB 10.6* 11.1*  HCT 34.3* 34.8*  MCV 83.7 83.1  PLT 142* 157    Cardiac Enzymes:  Recent Labs Lab 06/28/15 1255 06/28/15 1645 06/28/15 2332  TROPONINI 0.06* 0.06* 0.06*      Radiology: Dg Chest 2  View  06/28/2015  CLINICAL DATA:  Shortness of Breath EXAM: CHEST  2 VIEW COMPARISON:  12/15/2014 FINDINGS: Cardiomegaly again noted. Central mild vascular congestion without convincing pulmonary edema. Osteopenia and degenerative changes thoracic spine. Bilateral small pleural effusion with bilateral basilar atelectasis left greater than right. There is poorly visualized nodular density right base measures 2 cm. A lung nodule cannot be excluded. Further correlation with CT scan of the chest is recommended. Atherosclerotic calcifications of thoracic aorta. IMPRESSION: Central mild vascular congestion without convincing pulmonary edema. Osteopenia and degenerative changes thoracic spine. Bilateral small pleural effusion with bilateral basilar atelectasis left greater than right. There is poorly visualized nodular density right base measures 2 cm. A lung nodule cannot be excluded. Further correlation with CT scan of the chest is recommended. Electronically Signed   By: Lahoma Crocker M.D.   On: 06/28/2015 09:52   Ct Chest Wo Contrast  06/28/2015  CLINICAL DATA:  Abnormal chest x-ray EXAM: CT CHEST WITHOUT CONTRAST TECHNIQUE: Multidetector CT imaging of the chest was performed following the standard protocol without IV contrast. COMPARISON:  06/28/2015 x-ray of the chest FINDINGS: Sagittal images of the spine shows osteopenia and degenerative changes thoracic spine. There is dextroscoliosis and mild kyphosis of thoracic spine. Extensive atherosclerotic calcifications of thoracic aorta. Atherosclerotic calcifications of coronary arteries. Study is limited without IV contrast. A precarinal  lymph node measures 9 mm short-axis. No significant hilar adenopathy. Central airways are patent. There is cardiomegaly. Bilateral small pleural effusion. There is right base atelectasis. Left base atelectasis or infiltrate. As noted on chest x-ray there is a nodular lesion in right lower lobe posteriorly. This is best visualized in  sagittal image 20 measures 1.6 by 1.5 cm. Malignancy cannot be excluded. Further correlation with PET scan and/or biopsy is recommended. There is a nodule in superior segment of right lower lobe measures 8 mm. Second nodule in superior segment of right lower lobe measures 6 mm. A nodule in right upper lobe laterally just anterior to the fissure measures 7 mm. Metastatic disease cannot be excluded. There is a pleural-based nodule in left upper lobe posteriorly measures 8 mm. There is no pulmonary edema. Atherosclerotic calcifications of abdominal aorta. No adrenal gland mass is noted in visualized upper abdomen. Atherosclerotic calcifications of splenic artery. IMPRESSION: 1. As noted on chest x-ray there is a nodular lesion in right lower lobe posteriorly. This is best seen in sagittal image 20 measures 1.6 by 1.5 cm. Malignancy cannot be excluded. Further correlation with PET scan and/or biopsy is recommended. 2. No mediastinal or hilar adenopathy. 3. Additional smaller nodules are noted bilaterally. Metastatic disease cannot be excluded. 4. Atherosclerotic calcifications of thoracic and abdominal aorta. Atherosclerotic calcifications of coronary arteries. 5. Degenerative changes thoracic spine. 6. Bilateral small pleural effusion. There is atelectasis in right lower lobe posteriorly. Atelectasis or infiltrate in left lower lobe posteriorly. 7. Cardiomegaly is noted. Electronically Signed   By: Lahoma Crocker M.D.   On: 06/28/2015 11:47   US Renal  06/28/2015  CLINICAL DATA:  Acute renal failure, history of bladder cancer EXAM: RENAL / URINARY TRACT ULTRASOUND COMPLETE COMPARISON:  06/25/2012 Ho FINDINGS: Right Kidney: Length: 8.9 cm. Echogenicity within normal limits. No mass or hydronephrosis visualized. Left Kidney: Length: 10.2 cm.  Mild left hydronephrosis. No left renal calculus. Bladder: No urinary jets are visualized. There is a lesion in left posterior aspect of the urinary bladder measures 5 by 3.9 cm.  This is probable the known bladder mass. IMPRESSION: 1. Unremarkable right kidney. 2. There is very mild left hydronephrosis.  No left renal calculus. 3. There is a lesion/filling defect within left aspect of urinary bladder probable the known mass. Measures at least 5 x 3.9 x 4.5 cm. Clinical correlation is necessary. Electronically Signed   By: Lahoma Crocker M.D.   On: 06/28/2015 12:39   v   Medications:   Scheduled Medications: . calcium carbonate  1,250 mg Oral Q1200  . diltiazem  120 mg Oral Daily  . heparin  5,000 Units Subcutaneous 3 times per day  . metoprolol tartrate  25 mg Oral BID  . multivitamin with minerals  1 tablet Oral Q1200  . rosuvastatin  20 mg Oral QPC lunch  . sodium chloride  3 mL Intravenous Q12H    Infusions: . sodium chloride 75 mL/hr at 06/28/15 2123    PRN Medications: acetaminophen, docusate sodium   Assessment and Plan:   1. Atrial fib: Heart rate is essentially controlled. Some rates up into the high 80's with frequent PVC's. Repeat echo is underway during assessment. She is without cardiac complaint. Continue diltiazem and metoprolol but increase diltiazem due to hypertension and to help to lower HR a bit more. Not a candidate for anticoagulation in the setting of hematuria with bladder cancer.   2. Hypertension: Will increase diltiazem to 180 mg daily.   3. Positive Troponin:  They are stable and unchanged since admission. No plans to pursue ischemic work-up at this time due to other co-morbidities which are being addressed-lung nodule is being worked up with hx of bladder cancer.   4. Acute renal failure; Work up in progress. Abnormal renal ultrasound with renal mass.  Phill Myron. Lawrence NP [place]  06/29/2015, 10:17 AM   The patient was seen and examined, and I agree with the assessment and plan as documented above, with modifications as noted below. Pt admitted with exertional dyspnea. Troponins insignificantly elevated. Atrial fibrillation with  heart rates which are relatively well controlled. BP markedly elevated which can contribute to shortness of breath as well. Had mild to moderate aortic stenosis in 07/2014, with echocardiogram currently being performed during my exam.  Recommendations: Will follow up echocardiogram to see if there has been an interval change in cardiac function. Agree with increasing diltiazem dose for more optimal HR and BP control. No further recommendations at this time.  Kate Sable, MD, [facility]  06/29/2015 10:41 AM

## 2015-06-30 LAB — BASIC METABOLIC PANEL
Anion gap: 7 (ref 5–15)
BUN: 66 mg/dL — ABNORMAL HIGH (ref 6–20)
CALCIUM: 8.6 mg/dL — AB (ref 8.9–10.3)
CHLORIDE: 110 mmol/L (ref 101–111)
CO2: 24 mmol/L (ref 22–32)
CREATININE: 3.43 mg/dL — AB (ref 0.44–1.00)
GFR calc non Af Amer: 11 mL/min — ABNORMAL LOW (ref >60)
GFR, EST AFRICAN AMERICAN: 13 mL/min — AB (ref >60)
Glucose, Bld: 111 mg/dL — ABNORMAL HIGH (ref 65–99)
Potassium: 4.5 mmol/L (ref 3.5–5.1)
SODIUM: 141 mmol/L (ref 135–145)

## 2015-06-30 LAB — GLUCOSE, CAPILLARY
GLUCOSE-CAPILLARY: 108 mg/dL — AB (ref 65–99)
GLUCOSE-CAPILLARY: 128 mg/dL — AB (ref 65–99)
Glucose-Capillary: 110 mg/dL — ABNORMAL HIGH (ref 65–99)

## 2015-06-30 MED ORDER — CLONIDINE HCL 0.1 MG PO TABS: 0.3000 mg | ORAL_TABLET | Freq: Two times a day (BID) | ORAL | Status: DC

## 2015-06-30 MED ORDER — AMLODIPINE BESYLATE 5 MG PO TABS: 5.0000 mg | ORAL_TABLET | Freq: Every day | ORAL | Status: DC

## 2015-06-30 MED ORDER — CLONIDINE HCL 0.2 MG PO TABS
0.2000 mg | ORAL_TABLET | Freq: Two times a day (BID) | ORAL | Status: DC
  Administered 2015-07-01 – 2015-07-06 (×11): 0.2 mg via ORAL
  Filled 2015-06-30 (×11): qty 1

## 2015-06-30 MED ORDER — AMLODIPINE BESYLATE 5 MG PO TABS
10.0000 mg | ORAL_TABLET | Freq: Every day | ORAL | Status: DC
  Administered 2015-07-02 – 2015-07-06 (×5): 10 mg via ORAL
  Filled 2015-06-30 (×5): qty 2

## 2015-06-30 NOTE — Progress Notes (Signed)
Subjective:  Sore throat, likely secondary to oxygen per [place], no further dyspnea, little appetite, but no nausea or vomiting  Objective: Vital signs in last 24 hours: Temp:  [97.5 F (36.4 C)-97.9 F (36.6 C)] 97.7 F (36.5 C) (12/10 0455) Pulse Rate:  [51-83] 76 (12/10 0455) Resp:  [20] 20 (12/10 0455) BP: (134-171)/(58-81) 153/80 mmHg (12/10 0455) SpO2:  [93 %-97 %] 97 % (12/10 0455) Weight change:   Intake/Output from previous day: 12/09 0701 - 12/10 0700 In: 2524.3 [I.V.:2524.3] Out: 200 [Urine:200] Intake/Output this shift:   EXAM: General appearance:  Alert, talkative, in no apparent distress Resp:  Clear to auscultation, on O2 per [place] Cardio:  Irregular rhythm, HR controlled, no murmur or rub GI: +BS, soft and nontender Extremities:  No edema bilaterally  Lab Results:  Recent Labs  06/28/15 0917 06/29/15 0610  WBC 5.3 5.5  HGB 10.6* 11.1*  HCT 34.3* 34.8*  PLT 142* 157   BMET:  Recent Labs  06/29/15 0610 06/30/15 0605  NA 142 141  K 4.3 4.5  CL 107 110  CO2 26 24  GLUCOSE 119* 111*  BUN 68* 66*  CREATININE 3.53* 3.43*  CALCIUM 9.1 8.6*    Recent Labs  06/28/15 1645  PTH 108*  Comment   Iron Studies: No results for input(s): IRON, TIBC, TRANSFERRIN, FERRITIN in the last 72 hours.  Studies/Results: No results found.   Assessment/Plan: 1. Acute renal failure - likely secondary to diuretics, BUN and creatinine slowly improving with hydration (75 cc/hr), asymptomatic. 2. Chronic renal failure - Hx Stage III renal failure with baseline Cr 1.2-1.5. 3. Atrial fib - HR controlled on Metoprolol & Diltiazem (increased to 180 mg qd yesterday), followed by Cardiology.  4. Hx bladder cancer - s/p surgery, followed by Dr. Tresa Moore in [place]. 5. Dyspnea - resolved, on O2 per [place]; CXR 12/8 no edema, but small effusions; echo 12/9 with mild LVH, EF 55-60%. 6. Aortic stenosis - mild-to-moderate per echo 12/9. 7. Hypertension - BP better after Diltiazem  increase. 8. Anemia - Hgb stable. 9. Metabolic bone disease - iPTH 108.  Plan:  Continue with present mgt, BMET in AM   LOS: 2 days   LYLES,CHARLES 06/30/2015,8:27 AM   Patient doesn't require dialysis We'll continue with above treatment.

## 2015-06-30 NOTE — Progress Notes (Signed)
Paged Dr. Legrand Rams, weekend on-call doctor for patient heart rates of 39 and 37.  Will continue to monitor patient heart rate.

## 2015-06-30 NOTE — Progress Notes (Signed)
Systolic pressure still rather high although improved with the addition of clonidine will discontinue Cardizem due to AV nodal blocking effect and abnormal Norvasc 5 mg and increase clonidine to 0.3 by mouth twice a day Tracy [name] [id number] DOB: Nov 11, 1927 DOA: 06/28/2015 PCP: Tracy Hampshire, MD             Physical Exam: Blood pressure 170/72, pulse 61, temperature 97.7 F (36.5 C), temperature source Oral, resp. rate 20, height 5\' 3"  (1.6 [name]), weight 136 lb 7.4 oz (61.9 kg), SpO2 97 %. neck no JVD no carotid bruits no thyromegaly lungs diminished breath sounds in the bases scattered rhonchi prolonged expiratory phase no rales audible heart irregular regular no S3 auscultated   Investigations:  No results found for this or any previous visit (from the past 240 hour(s)).   Basic Metabolic Panel:  Recent Labs  06/29/15 0610 06/30/15 0605  NA 142 141  K 4.3 4.5  CL 107 110  CO2 26 24  GLUCOSE 119* 111*  BUN 68* 66*  CREATININE 3.53* 3.43*  CALCIUM 9.1 8.6*   Liver Function Tests: No results for input(s): AST, ALT, ALKPHOS, BILITOT, PROT, ALBUMIN in the last 72 hours.   CBC:  Recent Labs  06/28/15 0917 06/29/15 0610  WBC 5.3 5.5  NEUTROABS 4.0  --   HGB 10.6* 11.1*  HCT 34.3* 34.8*  MCV 83.7 83.1  PLT 142* 157    Ct Chest Wo Contrast  06/28/2015  CLINICAL DATA:  Abnormal chest x-ray EXAM: CT CHEST WITHOUT CONTRAST TECHNIQUE: Multidetector CT imaging of the chest was performed following the standard protocol without IV contrast. COMPARISON:  06/28/2015 x-ray of the chest FINDINGS: Sagittal images of the spine shows osteopenia and degenerative changes thoracic spine. There is dextroscoliosis and mild kyphosis of thoracic spine. Extensive atherosclerotic calcifications of thoracic aorta. Atherosclerotic calcifications of coronary arteries. Study is limited without IV contrast. A precarinal lymph node measures 9 mm short-axis. No significant hilar  adenopathy. Central airways are patent. There is cardiomegaly. Bilateral small pleural effusion. There is right base atelectasis. Left base atelectasis or infiltrate. As noted on chest x-ray there is a nodular lesion in right lower lobe posteriorly. This is best visualized in sagittal image 20 measures 1.6 by 1.5 cm. Malignancy cannot be excluded. Further correlation with PET scan and/or biopsy is recommended. There is a nodule in superior segment of right lower lobe measures 8 mm. Second nodule in superior segment of right lower lobe measures 6 mm. A nodule in right upper lobe laterally just anterior to the fissure measures 7 mm. Metastatic disease cannot be excluded. There is a pleural-based nodule in left upper lobe posteriorly measures 8 mm. There is no pulmonary edema. Atherosclerotic calcifications of abdominal aorta. No adrenal gland mass is noted in visualized upper abdomen. Atherosclerotic calcifications of splenic artery. IMPRESSION: 1. As noted on chest x-ray there is a nodular lesion in right lower lobe posteriorly. This is best seen in sagittal image 20 measures 1.6 by 1.5 cm. Malignancy cannot be excluded. Further correlation with PET scan and/or biopsy is recommended. 2. No mediastinal or hilar adenopathy. 3. Additional smaller nodules are noted bilaterally. Metastatic disease cannot be excluded. 4. Atherosclerotic calcifications of thoracic and abdominal aorta. Atherosclerotic calcifications of coronary arteries. 5. Degenerative changes thoracic spine. 6. Bilateral small pleural effusion. There is atelectasis in right lower lobe posteriorly. Atelectasis or infiltrate in left lower lobe posteriorly. 7. Cardiomegaly is noted. Electronically Signed   By: Tracy [name] [name].D.   On:  06/28/2015 11:47   US Renal  06/28/2015  CLINICAL DATA:  Acute renal failure, history of bladder cancer EXAM: RENAL / URINARY TRACT ULTRASOUND COMPLETE COMPARISON:  06/25/2012 Ho FINDINGS: Right Kidney: Length: 8.9 cm.  Echogenicity within normal limits. No mass or hydronephrosis visualized. Left Kidney: Length: 10.2 cm.  Mild left hydronephrosis. No left renal calculus. Bladder: No urinary jets are visualized. There is a lesion in left posterior aspect of the urinary bladder measures 5 by 3.9 cm. This is probable the known bladder mass. IMPRESSION: 1. Unremarkable right kidney. 2. There is very mild left hydronephrosis.  No left renal calculus. 3. There is a lesion/filling defect within left aspect of urinary bladder probable the known mass. Measures at least 5 x 3.9 x 4.5 cm. Clinical correlation is necessary. Electronically Signed   By: Tracy [name] [name].D.   On: 06/28/2015 12:39      Medications:   Impression: Mild to moderate aortic stenosis Hypertension suboptimally controlled  Principal Problem:   Acute renal failure (ARF) (HCC) Active Problems:   Dyslipidemia   Essential hypertension   ATRIAL FIBRILLATION, CHRONIC   Acute diastolic heart failure-(recurrent)   Bladder cancer- surgery 07/19/14   Elevated troponin     Plan: Hold Cardizem CD. Replace with Norvasc 5 mg by mouth daily due to reported pulses and bradycardia increase clonidine to 0.3 by mouth twice a day   Consultants: Cardiology and nephrology   Procedures   Antibiotics:                   Code Status:  Family Communication:    Disposition Plan see plan above  Time spent: 30 minutes   LOS: 2 days   Tracy [name]   06/30/2015, 10:40 AM

## 2015-06-30 NOTE — Progress Notes (Signed)
Spoke to Dr. Legrand Rams about patient's fluctuating heart rate.  Received no new orders.

## 2015-07-01 LAB — BASIC METABOLIC PANEL
ANION GAP: 6 (ref 5–15)
BUN: 67 mg/dL — AB (ref 6–20)
CALCIUM: 8.4 mg/dL — AB (ref 8.9–10.3)
CO2: 23 mmol/L (ref 22–32)
Chloride: 110 mmol/L (ref 101–111)
Creatinine, Ser: 3.57 mg/dL — ABNORMAL HIGH (ref 0.44–1.00)
GFR calc Af Amer: 13 mL/min — ABNORMAL LOW (ref >60)
GFR, EST NON AFRICAN AMERICAN: 11 mL/min — AB (ref >60)
GLUCOSE: 104 mg/dL — AB (ref 65–99)
Potassium: 4.6 mmol/L (ref 3.5–5.1)
Sodium: 139 mmol/L (ref 135–145)

## 2015-07-01 MED ORDER — TORSEMIDE 20 MG PO TABS
40.0000 mg | ORAL_TABLET | Freq: Every day | ORAL | Status: DC
Start: 2015-07-01 — End: 2015-07-05
  Administered 2015-07-01 – 2015-07-04 (×4): 40 mg via ORAL
  Filled 2015-07-01 (×4): qty 2

## 2015-07-01 NOTE — Progress Notes (Signed)
Patient experiencing bradycardia with heart rate dropping to the upper 20s. Patient also experienced a 4.08 sec pause per CCMD. MD notified, ordered to hold scheduled medications. Orders were followed, will continue to monitor patient status.

## 2015-07-01 NOTE — Progress Notes (Signed)
Subjective: Patient complains of slow heart rate last night but no chest pain. She doesn't have any nausea or vomiting. Her difficulty breathing is more or less the same. Her main concern seems to be living alone when she goes home and she seems to need some help.   Objective: Vital signs in last 24 hours: Temp:  [97.3 F (36.3 C)-98 F (36.7 C)] 98 F (36.7 C) (12/11 0602) Pulse Rate:  [31-84] 62 (12/11 0602) Resp:  [20] 20 (12/11 0602) BP: (131-170)/(55-87) 149/64 mmHg (12/11 0828) SpO2:  [95 %-98 %] 97 % (12/11 0824)  Intake/Output from previous day: 12/10 0701 - 12/11 0700 In: 240 [P.O.:240] Out: 600 [Urine:600] Intake/Output this shift:     Recent Labs  06/28/15 0917 06/29/15 0610  HGB 10.6* 11.1*    Recent Labs  06/28/15 0917 06/29/15 0610  WBC 5.3 5.5  RBC 4.10 4.19  HCT 34.3* 34.8*  PLT 142* 157    Recent Labs  06/30/15 0605 07/01/15 0559  NA 141 139  K 4.5 4.6  CL 110 110  CO2 24 23  BUN 66* 67*  CREATININE 3.43* 3.57*  GLUCOSE 111* 104*  CALCIUM 8.6* 8.4*   No results for input(s): LABPT, INR in the last 72 hours.  Generally patient is alert in no apparent distress. Doesn't use she is on mother oxygen. Chest: Decreased breath sound bilaterally Heart exam revealed irregular rate and rhythm no murmur Extremities no edema  Assessment/Plan: Problem #1 acute kidney injury: Presently her BUN and creatinine is still remain high without significant improvement. Patient however denies any uremic sinus symptoms. Problem #2 chronic renal failure stage III Problem #3 Atrovent fibrillation: Patient had severe bradycardia last night but she seems to be asymptomatic. Problem #4 history of bladder cancer colon status post surgery. Presently she has also had a bladder mass Problem #5 history of aortic stenosis Problem #6 history of difficulty breathing: Possibly multifactorial. Presently she is on oxygen and feeling better. Problem #7 anemia.: Her hemoglobin  is stable. Problem #8 hypertension: Her blood pressure is fluctuating. Seems to be high this morning. Plan: We'll start patient on Demadex 40 mg by mouth once a day Patient at this moment doesn't require any dialysis however if her renal function doesn't improve and become symptomatic then we may need to consider that. Patient overall her prognosis is guarded. We'll check a basic metabolic panel in the morning.   [place] Kamel S 07/01/2015, 8:30 AM

## 2015-07-01 NOTE — Progress Notes (Signed)
Patient switch Cardizem to Norvasc 10 mg daily for hypertension control and for a bradycardia and Cardizem CD notable blocking effects acquired since patient had clonidine 0.2 by mouth twice a day added for hypertension control systolic improved from [id number] to 149 today Tracy [name] [id number] DOB: 13-Mar-1928 DOA: 06/28/2015 PCP: [name], MD             Physical Exam: Blood pressure 149/64, pulse 62, temperature 98 F (36.7 C), temperature source Oral, resp. rate 20, height 5\' 3"  (1.6 m), weight 136 lb 7.4 oz (61.9 kg), SpO2 97 %. lungs diminished breath sounds in the bases scattered rhonchi prolonged expiratory phase mild end expiratory wheeze noted heart irregular regular no S3 auscultated no heaves thrills rubs   Investigations:  No results found for this or any previous visit (from the past 240 hour(s)).   Basic Metabolic Panel:  Recent Labs  06/30/15 0605 07/01/15 0559  NA 141 139  K 4.5 4.6  CL 110 110  CO2 24 23  GLUCOSE 111* 104*  BUN 66* 67*  CREATININE 3.43* 3.57*  CALCIUM 8.6* 8.4*   Liver Function Tests: No results for input(s): AST, ALT, ALKPHOS, BILITOT, PROT, ALBUMIN in the last 72 hours.   CBC:  Recent Labs  06/29/15 0610  WBC 5.5  HGB 11.1*  HCT 34.8*  MCV 83.1  PLT 157    No results found.    Medications:   Impression:  Principal Problem:   Acute renal failure (ARF) (HCC) Active Problems:   Dyslipidemia   Essential hypertension   ATRIAL FIBRILLATION, CHRONIC   Acute diastolic heart failure-(recurrent)   Bladder cancer- surgery 07/19/14   Elevated troponin     Plan: Monitor renal function daily. Demadex 40 mg by mouth added daily monitor systolic pressures continue current therapy  Consultants: Pulmonology and cardiology   Procedures   Antibiotics:                   Code Status:  Family Communication:    Disposition Plan   Time spent: 30 minutes   LOS: 3 days   Tallyn Holroyd  M   07/01/2015, 11:11 AM

## 2015-07-02 LAB — BASIC METABOLIC PANEL
ANION GAP: 5 (ref 5–15)
BUN: 64 mg/dL — ABNORMAL HIGH (ref 6–20)
CALCIUM: 8.4 mg/dL — AB (ref 8.9–10.3)
CO2: 22 mmol/L (ref 22–32)
Chloride: 112 mmol/L — ABNORMAL HIGH (ref 101–111)
Creatinine, Ser: 3.56 mg/dL — ABNORMAL HIGH (ref 0.44–1.00)
GFR, EST AFRICAN AMERICAN: 13 mL/min — AB (ref >60)
GFR, EST NON AFRICAN AMERICAN: 11 mL/min — AB (ref >60)
Glucose, Bld: 109 mg/dL — ABNORMAL HIGH (ref 65–99)
POTASSIUM: 4.9 mmol/L (ref 3.5–5.1)
Sodium: 139 mmol/L (ref 135–145)

## 2015-07-02 MED ORDER — RESOURCE THICKENUP CLEAR PO POWD
ORAL | Status: DC | PRN
  Filled 2015-07-02: qty 125

## 2015-07-02 NOTE — Progress Notes (Signed)
Patient seen as consult earlier this admit by Dr Domenic Polite and later cardiology signed off. New consult called this evening. We will resume rounding on patient tomorrow morning to reevaluate her hospital progress since she was last seen by Dr Bronson Ing 06/29/15.     Tracy Abts MD

## 2015-07-02 NOTE — Progress Notes (Signed)
Subjective: Patient complains of palpitation and exertional dyspnea. Her exertional dyspnea is not much different.  Objective: Vital signs in last 24 hours: Temp:  [97.6 F (36.4 C)-98.4 F (36.9 C)] 98.4 F (36.9 C) (12/12 0624) Pulse Rate:  [58-90] 90 (12/12 0624) Resp:  [20] 20 (12/12 0624) BP: (164-180)/(80-91) 164/82 mmHg (12/12 0624) SpO2:  [97 %-99 %] 99 % (12/12 0848)  Intake/Output from previous day: 12/11 0701 - 12/12 0700 In: 480 [P.O.:480] Out: -  Intake/Output this shift:    No results for input(s): HGB in the last 72 hours. No results for input(s): WBC, RBC, HCT, PLT in the last 72 hours.  Recent Labs  07/01/15 0559 07/02/15 0609  NA 139 139  K 4.6 4.9  CL 110 112*  CO2 23 22  BUN 67* 64*  CREATININE 3.57* 3.56*  GLUCOSE 104* 109*  CALCIUM 8.4* 8.4*   No results for input(s): LABPT, INR in the last 72 hours.  Generally patient is alert in no apparent distress. Doesn't use she is on mother oxygen. Chest: Decreased breath sound bilaterally Heart exam revealed irregular rate and rhythm no murmur Extremities no edema  Assessment/Plan: Problem #1 acute kidney injury: Her renal function is stable and presently is no showing any significant change. Patient states that she is making also curing. Problem #2 chronic renal failure stage III Problem #3 Atrial  fibrillation: Her heart rate is controlled. Patient has occasional bradycardia. Problem #4 history of bladder cancer c status post surgery. Presently she has also had a bladder mass Problem #5 history of aortic stenosis Problem #6 history of difficulty breathing: Possibly multifactorial. Continue to be an issue. However patient claims there is no significant change. Presently patient is on Demadex. Problem #7 anemia.: Her hemoglobin is stable. Problem #8 hypertension: Her blood pressure is fluctuating.  Plan: We'll continue his Demadex 2] will DC IV Lasix 3] I discussed with her about possibility of  dialysis if renal function doesn't improve or if her difficulty breathing is getting worse. Patient says that has this moment she does want to think about dialysis and she does want to go on it. For now patient doesn't seem to have any uremic sign and symptoms except exertional dyspnea. We'll continue to treat her conservatively.   Shawndale Kilpatrick S 07/02/2015, 9:18 AM

## 2015-07-02 NOTE — Progress Notes (Signed)
Her renal function and his level in off 2-D echo reveals varying calculations of moderate to severe aortic stenosis patient continues with dyspnea which is multifactorial will ask cardiology to reevaluate in light of echocardiographic findings Tracy [name] [id number] DOB: 1928/06/25 DOA: 06/28/2015 PCP: [name], MD             Physical Exam: Blood pressure 164/82, pulse 90, temperature 98.4 F (36.9 C), temperature source Oral, resp. rate 20, height 5\' 3"  (1.6 m), weight 136 lb 7.4 oz (61.9 kg), SpO2 99 %. lungs show diminished breath sounds in the bases prolonged expiratory phase no rales audible mild end expiratory wheeze noted bilaterally heart irregular regular no S3 no heaves thrills rubs noted   Investigations:  No results found for this or any previous visit (from the past 240 hour(s)).   Basic Metabolic Panel:  Recent Labs  07/01/15 0559 07/02/15 0609  NA 139 139  K 4.6 4.9  CL 110 112*  CO2 23 22  GLUCOSE 104* 109*  BUN 67* 64*  CREATININE 3.57* 3.56*  CALCIUM 8.4* 8.4*   Liver Function Tests: No results for input(s): AST, ALT, ALKPHOS, BILITOT, PROT, ALBUMIN in the last 72 hours.   CBC: No results for input(s): WBC, NEUTROABS, HGB, HCT, MCV, PLT in the last 72 hours.  No results found.    Medications:   Impression: Aortic stenosis  Principal Problem:   Acute renal failure (ARF) (HCC) Active Problems:   Dyslipidemia   Essential hypertension   ATRIAL FIBRILLATION, CHRONIC   Acute diastolic heart failure-(recurrent)   Bladder cancer- surgery 07/19/14   Elevated troponin     Plan: Ask cardiology to reevaluate patient in light of echocardiographic findings of aortic stenosis. Continue monitoring renal function out of bed to chair and physical therapy for strengthening and ambulation  Consultants: Cardiology and nephrology   Procedures   Antibiotics:                   Code Status:  Family Communication:     Disposition Plan see plan above  Time spent: 30 minutes   LOS: 4 days   [name] M   07/02/2015, 1:38 PM

## 2015-07-03 LAB — CBC
HCT: 28.6 % — ABNORMAL LOW (ref 36.0–46.0)
Hemoglobin: 9.3 g/dL — ABNORMAL LOW (ref 12.0–15.0)
MCH: 27.3 pg (ref 26.0–34.0)
MCHC: 32.5 g/dL (ref 30.0–36.0)
MCV: 83.9 fL (ref 78.0–100.0)
PLATELETS: 136 10*3/uL — AB (ref 150–400)
RBC: 3.41 MIL/uL — ABNORMAL LOW (ref 3.87–5.11)
RDW: 20.8 % — AB (ref 11.5–15.5)
WBC: 4.9 10*3/uL (ref 4.0–10.5)

## 2015-07-03 LAB — BASIC METABOLIC PANEL
Anion gap: 6 (ref 5–15)
BUN: 67 mg/dL — ABNORMAL HIGH (ref 6–20)
CALCIUM: 8.6 mg/dL — AB (ref 8.9–10.3)
CO2: 25 mmol/L (ref 22–32)
CREATININE: 3.67 mg/dL — AB (ref 0.44–1.00)
Chloride: 108 mmol/L (ref 101–111)
GFR calc Af Amer: 12 mL/min — ABNORMAL LOW (ref >60)
GFR calc non Af Amer: 10 mL/min — ABNORMAL LOW (ref >60)
GLUCOSE: 103 mg/dL — AB (ref 65–99)
Potassium: 4.6 mmol/L (ref 3.5–5.1)
Sodium: 139 mmol/L (ref 135–145)

## 2015-07-03 NOTE — Clinical Social Work Placement (Signed)
   CLINICAL SOCIAL WORK PLACEMENT  NOTE  Date:  07/03/2015  Patient Details  Name: Tracy [name] MRN: [id number] Date of Birth: 04-Nov-1927  Clinical Social Work is seeking post-discharge placement for this patient at the [place] level of care (*CSW will initial, date and re-position this form in  chart as items are completed):  Yes   Patient/family provided with [place] Work Department's list of facilities offering this level of care within the geographic area requested by the patient (or if unable, by the patient's family).  Yes   Patient/family informed of their freedom to choose among providers that offer the needed level of care, that participate in Medicare, Medicaid or managed care program needed by the patient, have an available bed and are willing to accept the patient.  Yes   Patient/family informed of 's ownership interest in [facility] and [facility], as well as of the fact that they are under no obligation to receive care at these facilities.  PASRR submitted to EDS on 07/03/15     PASRR number received on 07/03/15     Existing PASRR number confirmed on       FL2 transmitted to all facilities in geographic area requested by pt/family on 07/03/15     FL2 transmitted to all facilities within larger geographic area on       Patient informed that his/her managed care company has contracts with or will negotiate with certain facilities, including the following:            Patient/family informed of bed offers received.  Patient chooses bed at       Physician recommends and patient chooses bed at      Patient to be transferred to   on  .  Patient to be transferred to facility by       Patient family notified on   of transfer.  Name of family member notified:        PHYSICIAN       Additional Comment:    _______________________________________________ Salome Arnt, LCSW 07/03/2015,  11:13 AM [phone]

## 2015-07-03 NOTE — Progress Notes (Signed)
Subjective: Patient feels still the same. Still with some difficulty in breathing specially when she is taking a lot of pain. Her appetite is good and she doesn't have any nausea or vomiting.  Objective: Vital signs in last 24 hours: Temp:  [97.8 F (36.6 C)-98.8 F (37.1 C)] 97.8 F (36.6 C) (12/13 0557) Pulse Rate:  [56-80] 74 (12/13 0557) Resp:  [18] 18 (12/13 0557) BP: (142-177)/(57-67) 177/59 mmHg (12/13 0557) SpO2:  [95 %-99 %] 97 % (12/13 0557)  Intake/Output from previous day: 12/12 0701 - 12/13 0700 In: 720 [P.O.:720] Out: 300 [Urine:300] Intake/Output this shift:     Recent Labs  07/03/15 0549  HGB 9.3*    Recent Labs  07/03/15 0549  WBC 4.9  RBC 3.41*  HCT 28.6*  PLT 136*    Recent Labs  07/02/15 0609 07/03/15 0549  NA 139 139  K 4.9 4.6  CL 112* 108  CO2 22 25  BUN 64* 67*  CREATININE 3.56* 3.67*  GLUCOSE 109* 103*  CALCIUM 8.4* 8.6*   No results for input(s): LABPT, INR in the last 72 hours.  Generally patient is alert in no apparent distress. Doesn't use she is on mother oxygen. Chest: Decreased breath sound bilaterally Heart exam revealed irregular rate and rhythm no murmur Extremities no edema  Assessment/Plan: Problem #1 acute kidney injury:  Patient is non-oliguric. Presently her BUN and creatinine still increasing. She denies any nausea or vomiting.  Problem #2 chronic renal failure stage III Problem #3 Atrial  fibrillation: Her heart rate is controlled.  Problem #4 history of bladder cancer c status post surgery. Presently she has also had a bladder mass Problem #5 history of aortic stenosis Problem #6 history of difficulty breathing: no much change.  Problem #7 anemia.: Her hemoglobin is declining. Problem #8 hypertension: Her blood pressure  seems somewhat better.  Plan: We'll continue his Demadex 2] basic metabolic panel and phosphorus in the morning.  Breely Panik S 07/03/2015, 8:14 AM

## 2015-07-03 NOTE — Progress Notes (Signed)
Echo reviewed in detail and compared to prior echo Jan 26,2016. Both evaluations are complicated by afib. Similar reported mean gradients around 13-25mmHg, though both CW dopplers appear not quite parallel to flow and likely underestimate severity. The differences in reported AVA appears to be due to the use of different LVOT diameters (21 mm in the Jan 2016 study and 18 in the Dec 2016 study). The dimensionless index for both studies is around 0.3 supporting moderate AS. The collection of data supports moderate AS and does not support worsening of the valve since Jan 2016, and does not appear to be the main component of her symptoms. Her primary cardiac pathology appears to be diastoilc dysfunction which likely is contributing to her pulmonary hypertension.     Zandra Abts MD

## 2015-07-03 NOTE — Evaluation (Signed)
Physical Therapy Evaluation Patient Details Name: Tracy [name] MRN: [id number] DOB: 10/19/1927 Today's Date: 07/03/2015   History of Present Illness  Tracy [name] is a 81 y.o. female, with past medical history of bladder cancer, status post TURBT x 3, most recent in 05/03/2015 (Dr. Tresa Moore), history of A. Fib(anticoagulation been stopped secondary to hematuria) aortic stenosis, chronic diastolic CHF, history of CAD, hyperlipidemia, presents with complaints of progressive dyspnea, report developed over the last 24 hours, worse upon exertion and laying supine, reports she has been compliant with her Lasix, as well reports mild lower extremity edema, workup in ED significant for elevated BNP, and volume overload on chest x-ray, as well workup was significant for acute renal failure, with creatinine of 3.8, no evidence of urinary retention, bladder scan of 130 ml, and reports episodes of intermittent chest pain last few seconds, currently chest pain-free. patient was given 20 mg of IV Lasix in ED, hospitalist requested to admit.  Clinical Impression  Pt was seen for evaluation.  She was alert and cooperative, somewhat anxious.  She lives alone with peripheral assist of children.  She normally ambulates with a 4 wheeled walker.  Currently she is found to have significant generalized weakness with a deformity of the right shoulder which prevents full use of the RUE.  She is now on supplemental O2 at 2 L/min with O2 sat=97%/  On room air at rest, O2 sat=94% but with gait it drops to 89%.  She was able to ambulate 104' with a walker.  I am recommending SNF at d/c in order to help her regain prior functional level before her return home.    Follow Up Recommendations SNF    Equipment Recommendations  None recommended by PT    Recommendations for Other Services   OT    Precautions / Restrictions Precautions Precautions: Fall Restrictions Weight Bearing Restrictions: No      Mobility  Bed  Mobility Overal bed mobility: Needs Assistance Bed Mobility: Supine to Sit;Sit to Supine     Supine to sit: Min guard Sit to supine: Mod assist   General bed mobility comments: unable to lift LEs into the bed  Transfers Overall transfer level: Needs assistance Equipment used: 4-wheeled walker Transfers: Sit to/from Stand Sit to Stand: Min assist         General transfer comment: unable to use the RUE to assist in transfer  Ambulation/Gait Ambulation/Gait assistance: Min guard Ambulation Distance (Feet): 60 Feet Assistive device: 4-wheeled walker Gait Pattern/deviations: Trunk flexed;Decreased stride length;Decreased dorsiflexion - right;Decreased dorsiflexion - left;Shuffle   Gait velocity interpretation: <1.8 ft/sec, indicative of risk for recurrent falls General Gait Details: off of supplemental O2 with O2 sat=89% after gait  Stairs            Wheelchair Mobility    Modified Rankin (Stroke Patients Only)       Balance Overall balance assessment: Needs assistance Sitting-balance support: No upper extremity supported;Feet supported Sitting balance-Leahy Scale: Good     Standing balance support: No upper extremity supported Standing balance-Leahy Scale: Fair                               Pertinent Vitals/Pain Pain Assessment: No/denies pain    Home Living Family/patient expects to be discharged to:: Skilled nursing facility Living Arrangements: Alone                    Prior Function Level of Independence:  Independent with assistive device(s)         Comments: Pt reprots her daughter does her shopping and driving.      Hand Dominance   Dominant Hand: Right    Extremity/Trunk Assessment   Upper Extremity Assessment: Generalized weakness;RUE deficits/detail RUE Deficits / Details: significant deformity of right shoulder with no active ROM         Lower Extremity Assessment: Generalized weakness      Cervical /  Trunk Assessment: Kyphotic  Communication   Communication: No difficulties  Cognition Arousal/Alertness: Awake/alert Behavior During Therapy: Anxious Overall Cognitive Status: Within Functional Limits for tasks assessed                      General Comments      Exercises        Assessment/Plan    PT Assessment Patient needs continued PT services  PT Diagnosis Difficulty walking;Generalized weakness   PT Problem List Decreased strength;Decreased activity tolerance;Decreased mobility;Cardiopulmonary status limiting activity  PT Treatment Interventions Gait training;Functional mobility training;Therapeutic exercise   PT Goals (Current goals can be found in the Care Plan section) Acute Rehab PT Goals Patient Stated Goal: wants to be independent at home PT Goal Formulation: With patient Time For Goal Achievement: 07/17/15 Potential to Achieve Goals: Fair    Frequency Min 3X/week   Barriers to discharge Decreased caregiver support lives alone    Co-evaluation               End of Session Equipment Utilized During Treatment: Gait belt;Oxygen Activity Tolerance: Patient tolerated treatment well Patient left: in chair;with call bell/phone within reach Nurse Communication: Mobility status         Time: [id number] PT Time Calculation (min) (ACUTE ONLY): 41 min   Charges:   PT Evaluation $Initial PT Evaluation Tier I: 1 Procedure PT Treatments $Therapeutic Activity: 8-22 mins   PT G CodesSable Feil  OT 07/03/2015, 9:33 AM [phone]

## 2015-07-03 NOTE — Clinical Social Work Note (Signed)
Clinical Social Work Assessment  Patient Details  Name: Tracy [name] MRN: [id number] Date of Birth: 10/03/1927  Date of referral:  07/03/15               Reason for consult:  Discharge Planning                Permission sought to share information with:    Permission granted to share information::     Name::        Agency::     Relationship::     Contact Information:     Housing/Transportation Living arrangements for the past 2 months:  Single Family Home Source of Information:  Patient Patient Interpreter Needed:  None Criminal Activity/Legal Involvement Pertinent to Current Situation/Hospitalization:  No - Comment as needed Significant Relationships:  Adult Children, Other Family Members Lives with:  Self Do you feel safe going back to the place where you live?  No Need for family participation in patient care:  Yes (Comment)  Care giving concerns:  Pt lives alone.   Social Worker assessment / plan:  CSW met with pt at bedside. Pt alert and oriented and reports she lives alone. She indicates that she came to ED due to shortness of breath. Pt has a daughter and grandson nearby. At baseline, she is independent and walks with a walker and still drives. Pt has been in hospital for several days and admits to feeling weak. PT evaluated her this morning and recommend SNF. CSW discussed placement process, insurance authorization, and provided SNF list. Pt requests [facility] only as she has friends and family there. She plans to discuss with her daughter today.   Employment status:  Retired Nurse, adult PT Recommendations:  [place] / Referral to community resources:  [place]  Patient/Family's Response to care:  Pt agreeable to short term SNF as she acknowledges that she cannot return home alone right now.   Patient/Family's Understanding of and Emotional Response to Diagnosis, Current Treatment, and Prognosis:  Pt  discussed admission diagnosis and treatment plan. She does not really want to consider SNF, but states that she feels it will be necessary.   Emotional Assessment Appearance:  Appears stated age Attitude/Demeanor/Rapport:  Other (Cooperative) Affect (typically observed):  Accepting, Appropriate Orientation:  Oriented to Self, Oriented to Place, Oriented to  Time, Oriented to Situation Alcohol / Substance use:  Not Applicable Psych involvement (Current and /or in the community):  No (Comment)  Discharge Needs  Concerns to be addressed:  Discharge Planning Concerns Readmission within the last 30 days:  No Current discharge risk:  Lives alone Barriers to Discharge:  Continued Medical Work up   [employer], [employer], [place] 07/03/2015, 11:15 AM [phone]

## 2015-07-03 NOTE — Progress Notes (Signed)
Consulting cardiologist: Carlyle Dolly MD Primary Cardiologist: Lauree Chandler MD  Cardiology Specific Problem List: 1.Atrial fib-No anticoagulation due to hematuria and bladder cancer 2. Hypertension 3. Non-obstructive CAD 4. AoV stenosis   Subjective:   Complaints of severe weakness. Afraid of going home because no one there to take care of her.    Objective:   Temp:  [97.8 F (36.6 C)-98.8 F (37.1 C)] 97.8 F (36.6 C) (12/13 0557) Pulse Rate:  [56-80] 74 (12/13 0557) Resp:  [18] 18 (12/13 0557) BP: (142-177)/(57-67) 177/59 mmHg (12/13 0557) SpO2:  [95 %-98 %] 97 % (12/13 0557) Last BM Date: 06/27/15  Filed Weights   06/28/15 1337  Weight: 136 lb 7.4 oz (61.9 kg)    Intake/Output Summary (Last 24 hours) at 07/03/15 0851 Last data filed at 07/03/15 [id number]  Gross per 24 hour  Intake    480 ml  Output    300 ml  Net    180 ml    Telemetry: Atrial fib with rates in the 70;s with short pauses. None > 1.5 seconds.   Exam:  General: No acute distress.  HEENT: Conjunctiva and lids normal, oropharynx clear.  Lungs: Bibasilar crackles with mild wheezes in the right base. .  Cardiac: No elevated JVP or bruits. IRRR, no gallop or rub.   Abdomen: Normoactive bowel sounds, nontender, nondistended.  Extremities: No pitting edema, distal pulses full.  Neuropsychiatric: Alert and oriented x3, affect appropriate.  Echocardiogram 06/29/2015 Left ventricle: The cavity size was normal. Wall thickness was increased in a pattern of mild LVH. Systolic function was normal. The estimated ejection fraction was in the range of 55% to 60%. Wall motion was normal; there were no regional wall motion abnormalities. The study was not technically sufficient to allow evaluation of LV diastolic dysfunction due to atrial fibrillation. Doppler parameters are consistent with high ventricular filling pressure. - Aortic valve: Moderately calcified annulus.  Trileaflet; mildly thickened, mildly calcified leaflets. There was mild to moderate stenosis. There was mild regurgitation. Peak velocity (S): 237 cm/s. Mean gradient (S): 13 mm Hg. Valve area (VTI): 0.73 cm^2. Valve area (Vmax): 1 cm^2. Valve area (Vmean): 0.81 cm^2. - Mitral valve: There was moderate regurgitation. - Left atrium: The atrium was severely dilated. - Right ventricle: Systolic function was mildly reduced. - Right atrium: The atrium was moderately dilated. - Tricuspid valve: There was moderate-severe regurgitation. - Pulmonic valve: There was mild regurgitation. - Pulmonary arteries: PA peak pressure: 74 mm Hg (S).  Lab Results:  Basic Metabolic Panel:  Recent Labs Lab 07/01/15 0559 07/02/15 0609 07/03/15 0549  NA 139 139 139  K 4.6 4.9 4.6  CL 110 112* 108  CO2 23 22 25   GLUCOSE 104* 109* 103*  BUN 67* 64* 67*  CREATININE 3.57* 3.56* 3.67*  CALCIUM 8.4* 8.4* 8.6*    CBC:  Recent Labs Lab 06/28/15 0917 06/29/15 0610 07/03/15 0549  WBC 5.3 5.5 4.9  HGB 10.6* 11.1* 9.3*  HCT 34.3* 34.8* 28.6*  MCV 83.7 83.1 83.9  PLT 142* 157 136*    Cardiac Enzymes:  Recent Labs Lab 06/28/15 1255 06/28/15 1645 06/28/15 2332  TROPONINI 0.06* 0.06* 0.06*    Medications:   Scheduled Medications: . amLODipine  10 mg Oral Daily  . calcium carbonate  1,250 mg Oral Q1200  . cloNIDine  0.2 mg Oral BID  . heparin  5,000 Units Subcutaneous 3 times per day  . metoprolol tartrate  25 mg Oral BID  . multivitamin with minerals  1 tablet Oral Q1200  . rosuvastatin  20 mg Oral QPC lunch  . sodium chloride  3 mL Intravenous Q12H  . torsemide  40 mg Oral Daily      PRN Medications: acetaminophen, docusate sodium, RESOURCE THICKENUP CLEAR   Assessment and Plan:   1.Atrial fib: Rate controlled. Will continue metoprolol at 25 mg BID. Is now off of diltiazem and on amlodipine. No anticoagulation due to hematuria and bladder cancer.   2. AoV stenosis: Mild  to moderate. No plans to intervene at this time as she is asymptomatic. With renal failure, and other co-morbidities, would not recommend surgery or TAVR at this time. She normally sees Dr. Angelena Form in [place]. Defer to him to consider this. Continue afterload reducers.   3. Non-Obstructive CAD:  Echo did not determine that she had reduced EF and therefore no ischemic testing is planned at this time. She is asymptomatic for chest pain.   4. Pulmonary Hypertension: Currently on amlodipine. Will discuss with Dr. Harl Bowie additional therapy if needed.  5. Hypertension: On clonidine, amlodipine and BB. She has renal insufficiency creatinine of 3.67. No ACE. ARB.   6. CKD-stage II-Followed by nephrology. .   7.  Severe Deconditioning: She is undergoing PT currently. She may need transition to Rehab facility to continue strengthening.       Phill Myron. Lawrence NP AACC  07/03/2015, 8:51 AM   Patient seen and discussed with NP Purcell Nails, I agree with her documentation above. 81 y.o.female history of afib, nonobstructive CAD by cath [id number], chronic diastolic HF, HTN, HL, bladder cancer with previous resections but complicated by recurrences, recent lung nodule 1.6 x 1.5 cm on CT scan, admitted with SOB and volume overload. She has had issues with hematuria on anticoag in the past. Original consult was done by Dr Domenic Polite for mildly flat elevated troponin thought to be nonspecific, no ischemic testing recommended. She has afib that is rate controlled with lopressor, no anticoag due to recurrent issues with bladder tumor and hematuria. BP's remain elevated, norvasc rencently increased to 10mg  daily and she has been started on clonidine this admit, room to titrate clonidine if needed. We are contacted to reevaluate patient in setting of recent echo findings. Echo 06/29/15 with LVEF 55-60%, no WMAs, cannot eval diastolic function due to afib but evidence of high diastolic pressures, AV mean grad 13, AVA 0.81.  Moderate MR, mod to severe TR, PASP 74. CXR on admit without convincing edema, BNP 998. Admitted with AKI with Cr peak of 3.8, baseline 3 months ago 1.3-1.6. Diuretics per renal.   I will review echo images in detail, unclear if AV valve is playing role in her symptoms. Even if she does meet criteria for symptomatic AS she would be a poor candidate for any form of intervention given her advanced age, kidney disease, recurrent bladder tumor with bleeding, and lung nodule on recent CT scan awaiting further workup with malignancy being a concern.   Zandra Abts MD

## 2015-07-03 NOTE — NC FL2 (Signed)
[place] LEVEL OF CARE SCREENING TOOL     IDENTIFICATION  Patient Name: Tracy [name] Birthdate: 1927-09-17 Sex: female Admission Date (Current Location): 06/28/2015  [facility] and [place] Number:     Facility and Address:  [facility] [address] [place]      Provider Number: [id number]  Attending Physician Name and Address:  Marjean Donna, MD  Relative Name and Phone Number:       Current Level of Care: Hospital Recommended Level of Care: [place] Prior Approval Number:    Date Approved/Denied:   PASRR Number: [id number]  Discharge Plan: SNF    Current Diagnoses: Patient Active Problem List   Diagnosis Date Noted  . Acute renal failure (ARF) ([place]) 06/28/2015  . Elevated troponin 06/28/2015  . Hematuria 03/12/2015  . Aortic stenosis-mild to moderate 08/21/2014  . Chronic anticoagulation 08/21/2014  . CHF exacerbation ([place]) 08/20/2014  . Anemia 08/20/2014  . Chronic renal insufficiency, stage III (moderate) 08/20/2014  . CHF (congestive heart failure) ([place]) 08/20/2014  . Acute on chronic diastolic congestive heart failure ([place]) 08/16/2014  . Chest pain 08/15/2014  . Dyspnea 08/15/2014  . Bladder cancer- surgery 07/19/14 07/19/2014  . Encounter for therapeutic drug monitoring 08/12/2013  . Long term (current) use of anticoagulants 11/01/2010  . HYPERLIPIDEMIA TYPE IIB / III 10/04/2010  . MITRAL REGURG W/ AORTIC INSUFF, RHEUM/NON-RHEUM 10/04/2010  . Dyslipidemia 09/20/2009  . Essential hypertension 09/20/2009  . CAD- 40% LAD in 2003 09/20/2009  . ATRIAL FIBRILLATION, CHRONIC 09/20/2009  . Acute diastolic heart failure-(recurrent) 09/20/2009  . VENOUS INSUFFICIENCY, LEGS 09/20/2009    Orientation RESPIRATION BLADDER Height & Weight    Self, Time, Situation, Place  O2 (2L) Continent 5\' 3"  (160 cm) 136 lbs.  BEHAVIORAL SYMPTOMS/MOOD NEUROLOGICAL BOWEL NUTRITION STATUS  Other (Comment) (n/a)   (n/a) Continent Diet (heart healthy/carb modified)  AMBULATORY STATUS COMMUNICATION OF NEEDS Skin   Limited Assist Verbally Other (Comment) (skin tear right elbow)                       Personal Care Assistance Level of Assistance  Bathing, Feeding, Dressing Bathing Assistance: Limited assistance Feeding assistance: Limited assistance Dressing Assistance: Limited assistance     Functional Limitations Info  Sight, Hearing, Speech Sight Info: Adequate Hearing Info: Impaired Speech Info: Adequate    SPECIAL CARE FACTORS FREQUENCY  PT (By licensed PT)     PT Frequency: 5              Contractures Contractures Info: Not present    Additional Factors Info  Code Status, Allergies Code Status Info: DNR Allergies Info: Amoxicillins, Penicillins, Sulfonamide Derivatives           Current Medications (07/03/2015):  This is the current hospital active medication list Current Facility-Administered Medications  Medication Dose Route Frequency Provider Last Rate Last Dose  . acetaminophen (TYLENOL) tablet 500 mg  500 mg Oral Q6H PRN Albertine Patricia, MD   500 mg at 07/02/15 0246  . amLODipine (NORVASC) tablet 10 mg  10 mg Oral Daily Lucia Gaskins, MD   10 mg at 07/02/15 1012  . calcium carbonate (OS-CAL - dosed in mg of elemental calcium) tablet 1,250 mg  1,250 mg Oral Q1200 Albertine Patricia, MD   1,250 mg at 07/02/15 1332  . cloNIDine (CATAPRES) tablet 0.2 mg  0.2 mg Oral BID Lucia Gaskins, MD   0.2 mg at 07/03/15 0800  . docusate sodium (  COLACE) capsule 100 mg  100 mg Oral Daily PRN Albertine Patricia, MD      . heparin injection 5,000 Units  5,000 Units Subcutaneous 3 times per day Albertine Patricia, MD   5,000 Units at 07/03/15 0636  . metoprolol tartrate (LOPRESSOR) tablet 25 mg  25 mg Oral BID Albertine Patricia, MD   25 mg at 07/02/15 2338  . multivitamin with minerals tablet 1 tablet  1 tablet Oral Q1200 Albertine Patricia, MD   1 tablet at 07/02/15 1333  .  RESOURCE THICKENUP CLEAR   Oral PRN Angus McInnis, MD      . rosuvastatin (CRESTOR) tablet 20 mg  20 mg Oral QPC lunch Albertine Patricia, MD   20 mg at 07/02/15 1333  . sodium chloride 0.9 % injection 3 mL  3 mL Intravenous Q12H Albertine Patricia, MD   3 mL at 07/02/15 2105  . torsemide (DEMADEX) tablet 40 mg  40 mg Oral Daily Fran Lowes, MD   40 mg at 07/02/15 1012     Discharge Medications: Please see discharge summary for a list of discharge medications.  Relevant Imaging Results:  Relevant Lab Results:   Additional Information    Salome Arnt, [place]

## 2015-07-03 NOTE — Progress Notes (Signed)
Patient's renal function has minimally worsened with diuresis we'll now try gentle fluid resuscitation as well as her nephrology monitor electrolytes in a.m. as well as renal function was monitored dyspnea with fluid repletion therapy will require skilled nursing facility as she is weak and unable to ambulate alone by physical therapy Tracy [name] [id number] DOB: Aug 28, 1927 DOA: 06/28/2015 PCP: [name], MD             Physical Exam: Blood pressure 177/59, pulse 74, temperature 97.8 F (36.6 C), temperature source Oral, resp. rate 18, height 5\' 3"  (1.6 m), weight 136 lb 7.4 oz (61.9 kg), SpO2 97 %. lungs diminished breath sounds in the bases prolonged x-ray phase scattered rhonchi no rales no wheezes audible heart regular regular no S3 no heaves or rubs   Investigations:  No results found for this or any previous visit (from the past 240 hour(s)).   Basic Metabolic Panel:  Recent Labs  07/02/15 0609 07/03/15 0549  NA 139 139  K 4.9 4.6  CL 112* 108  CO2 22 25  GLUCOSE 109* 103*  BUN 64* 67*  CREATININE 3.56* 3.67*  CALCIUM 8.4* 8.6*   Liver Function Tests: No results for input(s): AST, ALT, ALKPHOS, BILITOT, PROT, ALBUMIN in the last 72 hours.   CBC:  Recent Labs  07/03/15 0549  WBC 4.9  HGB 9.3*  HCT 28.6*  MCV 83.9  PLT 136*    No results found.    Medications:   Impression:  Principal Problem:   Acute renal failure (ARF) (HCC) Active Problems:   Dyslipidemia   Essential hypertension   ATRIAL FIBRILLATION, CHRONIC   Acute diastolic heart failure-(recurrent)   Bladder cancer- surgery 07/19/14   Elevated troponin     Plan: Gentle fluid resuscitation and monitor renal function in a.m. consideration for skilled nursing facility placement  Consultants:    Procedures   Antibiotics:                   Code Status:  Family Communication:    Disposition Plan   Time spent:   LOS: 5 days   Corbin Hott  M   07/03/2015, 1:52 PM

## 2015-07-04 ENCOUNTER — Inpatient Hospital Stay (HOSPITAL_COMMUNITY): Payer: Medicare Other

## 2015-07-04 LAB — BASIC METABOLIC PANEL
ANION GAP: 7 (ref 5–15)
BUN: 66 mg/dL — ABNORMAL HIGH (ref 6–20)
CHLORIDE: 106 mmol/L (ref 101–111)
CO2: 24 mmol/L (ref 22–32)
Calcium: 8.7 mg/dL — ABNORMAL LOW (ref 8.9–10.3)
Creatinine, Ser: 3.83 mg/dL — ABNORMAL HIGH (ref 0.44–1.00)
GFR, EST AFRICAN AMERICAN: 11 mL/min — AB (ref >60)
GFR, EST NON AFRICAN AMERICAN: 10 mL/min — AB (ref >60)
Glucose, Bld: 109 mg/dL — ABNORMAL HIGH (ref 65–99)
POTASSIUM: 4.7 mmol/L (ref 3.5–5.1)
SODIUM: 137 mmol/L (ref 135–145)

## 2015-07-04 LAB — PHOSPHORUS: PHOSPHORUS: 3.8 mg/dL (ref 2.5–4.6)

## 2015-07-04 MED ORDER — SODIUM CHLORIDE 0.9 % IV SOLN
INTRAVENOUS | Status: DC
  Administered 2015-07-04 – 2015-07-05 (×3): 1 mL via INTRAVENOUS
  Administered 2015-07-06: 06:00:00 via INTRAVENOUS

## 2015-07-04 MED ORDER — FERROUS SULFATE 325 (65 FE) MG PO TABS
325.0000 mg | ORAL_TABLET | Freq: Every day | ORAL | Status: DC
  Administered 2015-07-04 – 2015-07-06 (×3): 325 mg via ORAL
  Filled 2015-07-04 (×3): qty 1

## 2015-07-04 NOTE — Progress Notes (Signed)
Awaiting results of gentle fluid resuscitation onto renal function patient found to have iron deficiency with concomitant anemia of chronic disease related Feosol 325 by mouth daily patient still frail will need skilled nursing facility however I feel we will need to 48 hours to assess response of renal function to fluid resuscitation Tracy [name] [id number] DOB: 1928-03-14 DOA: 06/28/2015 PCP: [name], MD             Physical Exam: Blood pressure 138/64, pulse 76, temperature 97.8 F (36.6 C), temperature source Oral, resp. rate 18, height 5\' 3"  (1.6 m), weight 136 lb 7.4 oz (61.9 kg), SpO2 95 %. lungs clear to A&P no rales wheeze rhonchi heart irregular rhythm 1/6 aortic flow murmur no heaves thrills rubs   Investigations:  No results found for this or any previous visit (from the past 240 hour(s)).   Basic Metabolic Panel:  Recent Labs  07/02/15 0609 07/03/15 0549  NA 139 139  K 4.9 4.6  CL 112* 108  CO2 22 25  GLUCOSE 109* 103*  BUN 64* 67*  CREATININE 3.56* 3.67*  CALCIUM 8.4* 8.6*   Liver Function Tests: No results for input(s): AST, ALT, ALKPHOS, BILITOT, PROT, ALBUMIN in the last 72 hours.   CBC:  Recent Labs  07/03/15 0549  WBC 4.9  HGB 9.3*  HCT 28.6*  MCV 83.9  PLT 136*    No results found.    Medication  Impression:  Principal Problem:   Acute renal failure (ARF) (HCC) Active Problems:   Dyslipidemia   Essential hypertension   ATRIAL FIBRILLATION, CHRONIC   Acute diastolic heart failure-(recurrent)   Bladder cancer- surgery 07/19/14   Elevated troponin     Plan: Monitor renal function with gentle fluid resuscitation Feosol 325 mg by mouth daily added to regimen January aggressive physical therapy and skilled nursing facility placement within 48 hours likely  Consultants: Cardiology and nephrology   Procedures   Antibiotics:                   Code Status:  Family Communication:    Disposition Plan  see plan above  Time spent: 30 minutes   LOS: 6 days   Tracy [name] M   07/04/2015, 7:15 AM

## 2015-07-04 NOTE — Progress Notes (Signed)
Tracy [name]  MRN: [id number]  DOB/AGE: 20-Dec-1927 81 y.o.  Primary Care Physician:MCINNIS,ANGUS G, MD  Admit date: 06/28/2015  Chief Complaint:  Chief Complaint  Patient presents with  . Shortness of Breath    S-Pt presented on  06/28/2015 with  Chief Complaint  Patient presents with  . Shortness of Breath  .    Pt says" I am feeling the same" Meds . amLODipine  10 mg Oral Daily  . calcium carbonate  1,250 mg Oral Q1200  . cloNIDine  0.2 mg Oral BID  . ferrous sulfate  325 mg Oral Q breakfast  . heparin  5,000 Units Subcutaneous 3 times per day  . metoprolol tartrate  25 mg Oral BID  . multivitamin with minerals  1 tablet Oral Q1200  . rosuvastatin  20 mg Oral QPC lunch  . sodium chloride  3 mL Intravenous Q12H  . torsemide  40 mg Oral Daily      Physical Exam: Vital signs in last 24 hours: Temp:  [97.8 F (36.6 C)-98.6 F (37 C)] 97.8 F (36.6 C) (12/14 0435) Pulse Rate:  [68-76] 76 (12/14 0435) Resp:  [16-18] 18 (12/14 0435) BP: (138-157)/(54-69) 138/64 mmHg (12/14 0435) SpO2:  [95 %-97 %] 95 % (12/14 0435) Weight change:  Last BM Date: 07/03/15  Intake/Output from previous day: 12/13 0701 - 12/14 0700 In: 720 [P.O.:720] Out: -  Total I/O In: 240 [P.O.:240] Out: -    Physical Exam: General- pt is awake,alert, oriented to time place and person Resp- No acute REsp distress, decreased BS CVS- S1S2 Irregular in rate and rhythm,  GIT- BS+, soft, NT, ND EXT-trace LE Edema, NO Cyanosis   Lab Results: CBC  Recent Labs  07/03/15 0549  WBC 4.9  HGB 9.3*  HCT 28.6*  PLT 136*    BMET  Recent Labs  07/03/15 0549 07/04/15 0554  NA 139 137  K 4.6 4.7  CL 108 106  CO2 25 24  GLUCOSE 103* 109*  BUN 67* 66*  CREATININE 3.67* 3.83*  CALCIUM 8.6* 8.7*   Creat trend 2016  1.4=>3.8=>3.6=>3.83 2015  1.5--2.5 2009  1.5  MICRO No results found for this or any previous visit (from the past 240 hour(s)).    Lab Results  Component Value  Date   PTH 108* 06/28/2015   PTH Comment 06/28/2015   CALCIUM 8.7* 07/04/2015   PHOS 3.8 07/04/2015               Impression: 1)Renal  AKI secondary to ATN                AKI at plateau                AKI on CKD               CKD stage .3               CKD since  2009               CKD secondary to Post renal (hx pf bladder cancer)                                              Age associated decline                Progression of CKD now marked with AKI  Hematuria present .       2)HTN Medication- On Diuretics On Calcium Channel Blockers On Beta blockers On Central Acting Sympatholytics   3)Anemia HGb stable   4)CKD Mineral-Bone Disorder PTH elevated  Secondary Hyperparathyroidismpresent  Calcium  at goal.   5)Atrial fibrillation- on Beta blockers Cardiology and Primary MD following  6)Electrolytes Normokalemic NOrmonatremic    7)Acid base Co2 at goal     Plan:  Will continue current care.      Sarahlynn Cisnero S 07/04/2015, 10:24 AM

## 2015-07-04 NOTE — Progress Notes (Addendum)
Patient ID: REXIE BARMAN, female   DOB: 1927/12/24, 81 y.o.   MRN: [id number]    Primary cardiologist:  Subjective:    + SOB  Objective:   Temp:  [97.8 F (36.6 C)-98.6 F (37 C)] 97.8 F (36.6 C) (12/14 0435) Pulse Rate:  [68-76] 76 (12/14 0435) Resp:  [16-18] 18 (12/14 0435) BP: (138-157)/(54-69) 138/64 mmHg (12/14 0435) SpO2:  [95 %-97 %] 95 % (12/14 0435) Last BM Date: 07/03/15  Filed Weights   06/28/15 1337  Weight: 136 lb 7.4 oz (61.9 kg)    Intake/Output Summary (Last 24 hours) at 07/04/15 0843 Last data filed at 07/03/15 1730  Gross per 24 hour  Intake    480 ml  Output      0 ml  Net    480 ml    Telemetry:none  Exam:  General: NAD  Resp: CTAB  Cardiac: irreg, 2/6 systolic murmur RUSB, no JVD  GI: abdomen soft, NT, ND  MSK: no LE edema  Neuro: no focal deficits  Psych: appropriate affect  Lab Results:  Basic Metabolic Panel:  Recent Labs Lab 07/02/15 0609 07/03/15 0549 07/04/15 0554  NA 139 139 137  K 4.9 4.6 4.7  CL 112* 108 106  CO2 22 25 24   GLUCOSE 109* 103* 109*  BUN 64* 67* 66*  CREATININE 3.56* 3.67* 3.83*  CALCIUM 8.4* 8.6* 8.7*    Liver Function Tests: No results for input(s): AST, ALT, ALKPHOS, BILITOT, PROT, ALBUMIN in the last 168 hours.  CBC:  Recent Labs Lab 06/28/15 0917 06/29/15 0610 07/03/15 0549  WBC 5.3 5.5 4.9  HGB 10.6* 11.1* 9.3*  HCT 34.3* 34.8* 28.6*  MCV 83.7 83.1 83.9  PLT 142* 157 136*    Cardiac Enzymes:  Recent Labs Lab 06/28/15 1255 06/28/15 1645 06/28/15 2332  TROPONINI 0.06* 0.06* 0.06*    BNP: No results for input(s): PROBNP in the last 8760 hours.  Coagulation: No results for input(s): INR in the last 168 hours.  ECG:   Medications:   Scheduled Medications: . amLODipine  10 mg Oral Daily  . calcium carbonate  1,250 mg Oral Q1200  . cloNIDine  0.2 mg Oral BID  . ferrous sulfate  325 mg Oral Q breakfast  . heparin  5,000 Units Subcutaneous 3 times per day  .  metoprolol tartrate  25 mg Oral BID  . multivitamin with minerals  1 tablet Oral Q1200  . rosuvastatin  20 mg Oral QPC lunch  . sodium chloride  3 mL Intravenous Q12H  . torsemide  40 mg Oral Daily     Infusions: . sodium chloride       PRN Medications:  acetaminophen, docusate sodium, RESOURCE THICKENUP CLEAR     Assessment/Plan    1. Afib - rate controlled with lopressor, no anticoag due to history of bladder tumor and recurrent hemturia.   2. Aortic stenosis - moderate by most recent echo, based on findings this is not the main contributor to her SOB. There is no indication for consideration for intervention of her valve, and given her multiple medical comorbidities if the valve did ever progress unclear if she would be a candidate for intervention  3. Chronic diastolic heart failure - significantly elevated LA pressures on echo, I suspect this is also the cause for her pulmonary hypertension - diuresis complicated by advanced renal disease. Diuretics managed by renal.  - I/Os incomplete, will reorder strict ins and outs. Order daily weights.  - by exam appears euvolemic, her  Cr and BUN trend would suggest she is becoming dry . With her continued SOB I do not believe this if CHF related. Will order repeat CXR, orthostatics to further evaluate.  - currently has NS and torsemide written for, I would suggest holding the toresmide and follow renal function as I don't believe she is volume overloaded.   4. Lung nodule -noted on CT, further workup per primary team.    Carlyle Dolly, M.D.

## 2015-07-04 NOTE — Clinical Social Work Placement (Signed)
   CLINICAL SOCIAL WORK PLACEMENT  NOTE  Date:  07/04/2015  Patient Details  Name: Tracy [name] MRN: [id number] Date of Birth: May 22, 1928  Clinical Social Work is seeking post-discharge placement for this patient at the [place] level of care (*CSW will initial, date and re-position this form in  chart as items are completed):  Yes   Patient/family provided with [place] [facility] Work Department's list of facilities offering this level of care within the geographic area requested by the patient (or if unable, by the patient's family).  Yes   Patient/family informed of their freedom to choose among providers that offer the needed level of care, that participate in Medicare, Medicaid or managed care program needed by the patient, have an available bed and are willing to accept the patient.  Yes   Patient/family informed of [facility]'s ownership interest in [facility] and [facility], as well as of the fact that they are under no obligation to receive care at these facilities.  PASRR submitted to EDS on 07/03/15     PASRR number received on 07/03/15     Existing PASRR number confirmed on       FL2 transmitted to all facilities in geographic area requested by pt/family on 07/03/15     FL2 transmitted to all facilities within larger geographic area on       Patient informed that his/her managed care company has contracts with or will negotiate with certain facilities, including the following:        Yes   Patient/family informed of bed offers received.  Patient chooses bed at [facility]     Physician recommends and patient chooses bed at      Patient to be transferred to [facility] on  .  Patient to be transferred to facility by       Patient family notified on   of transfer.  Name of family member notified:        PHYSICIAN       Additional Comment:  Pt has discussed with her daughter who is also agreeable per  pt. Authorization received from [facility], but will most likely need to be resubmitted as pt is not ready for d/c. [place] updated.   _______________________________________________ Salome Arnt, [place] 07/04/2015, 8:40 AM [phone]

## 2015-07-05 LAB — BASIC METABOLIC PANEL
Anion gap: 7 (ref 5–15)
BUN: 69 mg/dL — AB (ref 6–20)
CHLORIDE: 105 mmol/L (ref 101–111)
CO2: 24 mmol/L (ref 22–32)
CREATININE: 3.77 mg/dL — AB (ref 0.44–1.00)
Calcium: 8.6 mg/dL — ABNORMAL LOW (ref 8.9–10.3)
GFR calc Af Amer: 12 mL/min — ABNORMAL LOW (ref >60)
GFR calc non Af Amer: 10 mL/min — ABNORMAL LOW (ref >60)
GLUCOSE: 107 mg/dL — AB (ref 65–99)
Potassium: 4.8 mmol/L (ref 3.5–5.1)
SODIUM: 136 mmol/L (ref 135–145)

## 2015-07-05 NOTE — Progress Notes (Signed)
Subjective: Patient does not have any nausea or vomiting. States she has some exertional dyspnea but no orthopnea.   Objective: Vital signs in last 24 hours: Temp:  [97.5 F (36.4 C)-97.6 F (36.4 C)] 97.5 F (36.4 C) (12/15 0552) Pulse Rate:  [51-76] 76 (12/15 0552) Resp:  [18-20] 20 (12/15 0552) BP: (147-164)/(61-85) 158/66 mmHg (12/15 0552) SpO2:  [98 %-100 %] 100 % (12/15 0552) Weight:  [147 lb 9.6 oz (66.951 kg)] 147 lb 9.6 oz (66.951 kg) (12/15 0400)  Intake/Output from previous day: 12/14 0701 - 12/15 0700 In: 720 [P.O.:720] Out: 700 [Urine:700] Intake/Output this shift:     Recent Labs  07/03/15 0549  HGB 9.3*    Recent Labs  07/03/15 0549  WBC 4.9  RBC 3.41*  HCT 28.6*  PLT 136*    Recent Labs  07/03/15 0549 07/04/15 0554  NA 139 137  K 4.6 4.7  CL 108 106  CO2 25 24  BUN 67* 66*  CREATININE 3.67* 3.83*  GLUCOSE 103* 109*  CALCIUM 8.6* 8.7*   No results for input(s): LABPT, INR in the last 72 hours.  Generally patient is alert in no apparent distress. patient is sitting on a chair.  Chest: Decreased breath sound bilaterally Heart exam revealed irregular rate and rhythm no murmur Extremities no edema  Assessment/Plan: Problem #1 acute kidney injury:  Patient is non-oliguric. Presently her BUN and creatinine still increasing.  possibly secondary to prerenal syndrome. Presently she is on diuretics. Patient does not have any nausea or vomiting.  Problem #2 chronic renal failure stage III Problem #3 Atrial  fibrillation: Her heart rate is controlled.  Problem #4 history of bladder cancer c status post surgery. Presently she has also had a bladder mass Problem #5 history of aortic stenosis Problem #6 history of difficulty breathing: presently no significant change. Most likely cardiac. Patient doesn't seem to have significant sign of fluid overload.  Problem #7 anemia.: Her hemoglobin is low but stable.  Problem #8 hypertension: Her blood pressure   seems somewhat better.  Problem #9 metabolic bone disease: Her calcium and phosphorus is range. Plan: We'll DC Demadex 2] we'll increase her IV fluid to 1 25 mL per hour 2] basic metabolic panel in the morning.  Visente Kirker S 07/05/2015, 9:12 AM

## 2015-07-05 NOTE — Progress Notes (Signed)
Physical Therapy Treatment Patient Details Name: Tracy [name] MRN: [id number] DOB: 11-29-1927 Today's Date: 07/05/2015    History of Present Illness Tracy [name] is a 81 y.o. female, with past medical history of bladder cancer, status post TURBT x 3, most recent in 05/03/2015 (Dr. Tresa Moore), history of A. Fib(anticoagulation been stopped secondary to hematuria) aortic stenosis, chronic diastolic CHF, history of CAD, hyperlipidemia, presents with complaints of progressive dyspnea, report developed over the last 24 hours, worse upon exertion and laying supine, reports she has been compliant with her Lasix, as well reports mild lower extremity edema, workup in ED significant for elevated BNP, and volume overload on chest x-ray, as well workup was significant for acute renal failure, with creatinine of 3.8, no evidence of urinary retention, bladder scan of 130 ml, and reports episodes of intermittent chest pain last few seconds, currently chest pain-free. patient was given 20 mg of IV Lasix in ED, hospitalist requested to admit.    PT Comments    Pt reports fatigue from having sat up this morning.  She is currently on 2 L O2 with resting O2 sat=92%.  She tolerated some general LE strengthening ex but did not complete all due to fatigue.  She continues to have difficulty with transfers, particularly standing from sitting and going from sit to supine.  She was instructed in anterior weight shift with standing and this helped to some degree.  Ambulatory endurance is increased today as long as no challenges to gait are presented.  She remains very cooperative and motivated.  She continues to need SNF at d/c as she lives alone.  Follow Up Recommendations  SNF     Equipment Recommendations  None recommended by PT    Recommendations for Other Services  OT     Precautions / Restrictions Precautions Precautions: Fall Restrictions Weight Bearing Restrictions: No    Mobility  Bed  Mobility Overal bed mobility: Needs Assistance Bed Mobility: Supine to Sit;Sit to Supine     Supine to sit: Min guard Sit to supine: Mod assist   General bed mobility comments: unable to lift LEs into the bed  Transfers Overall transfer level: Needs assistance Equipment used: 4-wheeled walker Transfers: Sit to/from Stand Sit to Stand: Min assist         General transfer comment: mod assist needed to rise from a commode  Ambulation/Gait Ambulation/Gait assistance: Supervision Ambulation Distance (Feet): 120 Feet Assistive device: 4-wheeled walker Gait Pattern/deviations: Trunk flexed;Decreased stride length;Decreased dorsiflexion - right;Decreased dorsiflexion - left;Shuffle   Gait velocity interpretation: <1.8 ft/sec, indicative of risk for recurrent falls General Gait Details: pt stayed on supplemental O2 at 2 L/min with O2 sat=91% after gait   Stairs            Wheelchair Mobility    Modified Rankin (Stroke Patients Only)       Balance Overall balance assessment: Needs assistance Sitting-balance support: No upper extremity supported;Feet supported Sitting balance-Leahy Scale: Good     Standing balance support: No upper extremity supported Standing balance-Leahy Scale: Fair                      Cognition Arousal/Alertness: Awake/alert Behavior During Therapy: WFL for tasks assessed/performed Overall Cognitive Status: Within Functional Limits for tasks assessed                      Exercises General Exercises - Lower Extremity Ankle Circles/Pumps: AROM;Both;10 reps;Supine Quad Sets: AROM;Both;10 reps;Supine Gluteal Sets: AROM;Both;10 reps;Supine Heel  Slides: AROM;Both;10 reps;Supine    General Comments        Pertinent Vitals/Pain Pain Assessment: No/denies pain    Home Living                      Prior Function            PT Goals (current goals can now be found in the care plan section) Progress towards PT  goals: Progressing toward goals    Frequency  Min 3X/week    PT Plan Current plan remains appropriate    Co-evaluation             End of Session Equipment Utilized During Treatment: Gait belt;Oxygen Activity Tolerance: Patient tolerated treatment well Patient left: in bed;with call bell/phone within reach;with bed alarm set     Time: 1530-1603 PT Time Calculation (min) (ACUTE ONLY): 33 min  Charges:  $Gait Training: 8-22 mins $Therapeutic Exercise: 8-22 mins $Therapeutic Activity: 8-22 mins                    G CodesSable Feil  OT 07/05/2015, 4:20 PM [phone]

## 2015-07-05 NOTE — Care Management Note (Signed)
Case Management Note  Patient Details  Name: OLIVINE KADISH MRN: [id number] Date of Birth: [birth year]  Subjective/Objective:                    Action/Plan:   Expected Discharge Date:                  Expected Discharge Plan:  Home/Self Care  In-House Referral:  NA  Discharge planning Services  CM Consult  Post Acute Care Choice:  NA Choice offered to:  NA  DME Arranged:    DME Agency:     HH Arranged:    HH Agency:     Status of Service:  Completed, signed off  Medicare Important Message Given:  Yes Date Medicare IM Given:    Medicare IM give by:    Date Additional Medicare IM Given:    Additional Medicare Important Message give by:     If discussed at [place] of Stay Meetings, dates discussed:  07/05/15  Additional Comments:  Joylene Draft, RN 07/05/2015, 3:53 PM

## 2015-07-05 NOTE — Progress Notes (Signed)
Subjective:    Stable SOB  Objective:   Temp:  [97.5 F (36.4 C)-97.6 F (36.4 C)] 97.5 F (36.4 C) (12/15 0552) Pulse Rate:  [51-76] 76 (12/15 0552) Resp:  [18-20] 20 (12/15 0552) BP: (147-164)/(61-85) 158/66 mmHg (12/15 0552) SpO2:  [98 %-100 %] 100 % (12/15 0552) Weight:  [147 lb 9.6 oz (66.951 kg)] 147 lb 9.6 oz (66.951 kg) (12/15 0400) Last BM Date: 07/05/15  Filed Weights   06/28/15 1337 07/05/15 0400  Weight: 136 lb 7.4 oz (61.9 kg) 147 lb 9.6 oz (66.951 kg)    Intake/Output Summary (Last 24 hours) at 07/05/15 1021 Last data filed at 07/05/15 0700  Gross per 24 hour  Intake    480 ml  Output    700 ml  Net   -220 ml     Exam:  General: NAD  Resp: CTAB  Cardiac: RRR, 2/6 systolic murmur RUSB, no jvd  GI: abdomen soft, NT, ND  MSK: no LE edema  Neuro: no focal deficits  Psych: appropriate affect  Lab Results:  Basic Metabolic Panel:  Recent Labs Lab 07/02/15 0609 07/03/15 0549 07/04/15 0554  NA 139 139 137  K 4.9 4.6 4.7  CL 112* 108 106  CO2 22 25 24   GLUCOSE 109* 103* 109*  BUN 64* 67* 66*  CREATININE 3.56* 3.67* 3.83*  CALCIUM 8.4* 8.6* 8.7*    Liver Function Tests: No results for input(s): AST, ALT, ALKPHOS, BILITOT, PROT, ALBUMIN in the last 168 hours.  CBC:  Recent Labs Lab 06/29/15 0610 07/03/15 0549  WBC 5.5 4.9  HGB 11.1* 9.3*  HCT 34.8* 28.6*  MCV 83.1 83.9  PLT 157 136*    Cardiac Enzymes:  Recent Labs Lab 06/28/15 1255 06/28/15 1645 06/28/15 2332  TROPONINI 0.06* 0.06* 0.06*    BNP: No results for input(s): PROBNP in the last 8760 hours.  Coagulation: No results for input(s): INR in the last 168 hours.  ECG:   Medications:   Scheduled Medications: . amLODipine  10 mg Oral Daily  . calcium carbonate  1,250 mg Oral Q1200  . cloNIDine  0.2 mg Oral BID  . ferrous sulfate  325 mg Oral Q breakfast  . heparin  5,000 Units Subcutaneous 3 times per day  . metoprolol tartrate  25 mg Oral BID  .  multivitamin with minerals  1 tablet Oral Q1200  . rosuvastatin  20 mg Oral QPC lunch  . sodium chloride  3 mL Intravenous Q12H     Infusions: . sodium chloride 125 mL/hr at 07/05/15 0917     PRN Medications:  acetaminophen, docusate sodium, RESOURCE THICKENUP CLEAR     Assessment/Plan   1. Afib - rate controlled with lopressor, no anticoag due to history of bladder tumor and recurrent hemturia.   2. Aortic stenosis - moderate by most recent echo, based on findings this is not the main contributor to her SOB. There is no indication for consideration for intervention of her valve, and given her multiple medical comorbidities if the valve did ever progress unclear if she would be a candidate for intervention  3. Chronic diastolic heart failure - significantly elevated LA pressures on echo, I suspect this is also the cause for her pulmonary hypertension - diuresis complicated by advanced renal disease. Diuretics managed by renal.  - I/Os incomplete, will reorder strict ins and outs. Order daily weights.  - by exam appears euvolemic, her Cr and BUN trend would suggest she is becoming dry .Orthostatics  were normal, by weights however she is 6 pounds up from 12/2014 clinic visit, though this appears inaccurate as she was listed as 136lbs at our original consult.  - defer diuretics to renal, with uptrending Cr and BUN would consider holding them  4. Lung nodule -noted on CT, further workup per primary team.    Defer diuretics to renal.From a cardiac standpoint we do not have any other interventions or testing that is recommended at this time, we will sign off.      Carlyle Dolly, M.D

## 2015-07-05 NOTE — Clinical Social Work Note (Signed)
CSW updated PNC on pt. Anticipate d/c tomorrow. CSW faxed updated clinicals to [facility] for re-authorization.  Benay Pike, [place]

## 2015-07-05 NOTE — Progress Notes (Signed)
Appreciate renal and cardiology notes renal Alexis diminished diuretics and increase fluids we'll monitor be met in a.[name]. Friday if not improvement like to discharge to SNF Tracy [name] [id number] DOB: [birth year] DOA: 06/28/2015 PCP: Tracy Hampshire, MD             Physical Exam: Blood pressure 155/82, pulse 71, temperature 97 F (36.1 C), temperature source Oral, resp. rate 18, height 5' 3"  (1.6 [name]), weight 147 lb 9.6 oz (66.951 kg), SpO2 99 %. lungs diminished breath sounds in the bases no rales wheeze rhonchi appreciable heart irregular regular no S3 no heaves thrills rubs abdomen soft nontender bowel sounds normoactive   Investigations:  No results found for this or any previous visit (from the past 240 hour(s)).   Basic Metabolic Panel:  Recent Labs  07/04/15 0554 07/05/15 1048  NA 137 136  K 4.7 4.8  CL 106 105  CO2 24 24  GLUCOSE 109* 107*  BUN 66* 69*  CREATININE 3.83* 3.77*  CALCIUM 8.7* 8.6*  PHOS 3.8  --    Liver Function Tests: No results for input(s): AST, ALT, ALKPHOS, BILITOT, PROT, ALBUMIN in the last 72 hours.   CBC:  Recent Labs  07/03/15 0549  WBC 4.9  HGB 9.3*  HCT 28.6*  MCV 83.9  PLT 136*    Dg Chest 2 View  07/04/2015  CLINICAL DATA:  Shortness of breath, bladder cancer EXAM: CHEST  2 VIEW COMPARISON:  CT chest dated 06/28/2015 FINDINGS: Small to moderate bilateral pleural effusions. Associated patchy bilateral lower lobe opacities, likely compressive atelectasis. Known right lower lobe pulmonary nodule from recent chest radiograph/ CT is obscured. No frank interstitial edema.  No pneumothorax. The heart is top-normal in size/ mildly enlarged. Degenerative changes of the visualized thoracolumbar spine. IMPRESSION: Small to moderate bilateral pleural effusions, increased. Associated patchy bilateral lower lobe opacities, likely compressive atelectasis. No frank interstitial edema. Known right lower lobe pulmonary nodule from recent  chest radiograph/CT is obscured. Electronically Signed   By: Julian Hy [name].D.   On: 07/04/2015 10:27      Medications:   Impression:  Principal Problem:   Acute renal failure (ARF) (HCC) Active Problems:   Dyslipidemia   Essential hypertension   ATRIAL FIBRILLATION, CHRONIC   Acute diastolic heart failure-(recurrent)   Bladder cancer- surgery 07/19/14   Elevated troponin     Plan: Increase IV fluid check renal function in a.[name]. as well as electrolytes will like to discharge to skilled nursing facility tomorrow if no significant improvement in renal function  Consultants: Cardiology and nephrology   Procedures   Antibiotics:                   Code Status:  Family Communication:    Disposition Plan see plan above  Time spent: 30 minutes   LOS: 7 days   Tracy [name]   07/05/2015, 12:46 PM

## 2015-07-06 ENCOUNTER — Inpatient Hospital Stay
Admission: RE | Admit: 2015-07-06 | Discharge: 2015-08-22 | Disposition: E | Payer: Medicare Other | Source: Ambulatory Visit | Attending: Internal Medicine | Admitting: Internal Medicine

## 2015-07-06 DIAGNOSIS — N1330 Unspecified hydronephrosis: Principal | ICD-10-CM

## 2015-07-06 DIAGNOSIS — R059 Cough, unspecified: Secondary | ICD-10-CM

## 2015-07-06 DIAGNOSIS — R05 Cough: Secondary | ICD-10-CM

## 2015-07-06 LAB — BASIC METABOLIC PANEL
ANION GAP: 9 (ref 5–15)
BUN: 71 mg/dL — ABNORMAL HIGH (ref 6–20)
CHLORIDE: 106 mmol/L (ref 101–111)
CO2: 21 mmol/L — AB (ref 22–32)
Calcium: 8.6 mg/dL — ABNORMAL LOW (ref 8.9–10.3)
Creatinine, Ser: 3.89 mg/dL — ABNORMAL HIGH (ref 0.44–1.00)
GFR calc non Af Amer: 10 mL/min — ABNORMAL LOW (ref >60)
GFR, EST AFRICAN AMERICAN: 11 mL/min — AB (ref >60)
Glucose, Bld: 100 mg/dL — ABNORMAL HIGH (ref 65–99)
POTASSIUM: 4.7 mmol/L (ref 3.5–5.1)
SODIUM: 136 mmol/L (ref 135–145)

## 2015-07-06 MED ORDER — CLONIDINE HCL 0.2 MG PO TABS: 0.2000 mg | ORAL_TABLET | Freq: Two times a day (BID) | ORAL | Status: AC

## 2015-07-06 MED ORDER — FERROUS SULFATE 325 (65 FE) MG PO TABS: 325.0000 mg | ORAL_TABLET | Freq: Every day | ORAL | Status: AC

## 2015-07-06 MED ORDER — AMLODIPINE BESYLATE 10 MG PO TABS: 10.0000 mg | ORAL_TABLET | Freq: Every day | ORAL | Status: DC

## 2015-07-06 NOTE — Clinical Social Work Placement (Signed)
   CLINICAL SOCIAL WORK PLACEMENT  NOTE  Date:  07/02/2015  Patient Details  Name: Tracy [name] MRN: [id number] Date of Birth: March 23, 1928  Clinical Social Work is seeking post-discharge placement for this patient at the [place] level of care (*CSW will initial, date and re-position this form in  chart as items are completed):  Yes   Patient/family provided with [place] Work Department's list of facilities offering this level of care within the geographic area requested by the patient (or if unable, by the patient's family).  Yes   Patient/family informed of their freedom to choose among providers that offer the needed level of care, that participate in Medicare, Medicaid or managed care program needed by the patient, have an available bed and are willing to accept the patient.  Yes   Patient/family informed of [place]'s ownership interest in [facility] and [facility], as well as of the fact that they are under no obligation to receive care at these facilities.  PASRR submitted to EDS on 07/03/15     PASRR number received on 07/03/15     Existing PASRR number confirmed on       FL2 transmitted to all facilities in geographic area requested by pt/family on 07/03/15     FL2 transmitted to all facilities within larger geographic area on       Patient informed that his/her managed care company has contracts with or will negotiate with certain facilities, including the following:        Yes   Patient/family informed of bed offers received.  Patient chooses bed at [facility]     Physician recommends and patient chooses bed at      Patient to be transferred to [facility] on 07/09/2015.  Patient to be transferred to facility by [facility] staff      Patient family notified on 07/11/2015 of transfer.  Name of family member notified:  [name], daughter     PHYSICIAN       Additional Comment:     _______________________________________________ Ambrose Pancoast D, LCSW 07/19/2015, 2:06 PM  [phone]

## 2015-07-06 NOTE — Care Management Note (Signed)
Case Management Note  Patient Details  Name: RADIANCE DEVOTO MRN: [id number] Date of Birth: 1928-04-14  Subjective/Objective:                    Action/Plan:   Expected Discharge Date:                  Expected Discharge Plan:  Skilled Nursing Facility  In-House Referral:  Clinical Social Work  Discharge planning Services  CM Consult  Post Acute Care Choice:  NA Choice offered to:  NA  DME Arranged:    DME Agency:     HH Arranged:    [place] Agency:     Status of Service:  Completed, signed off  Medicare Important Message Given:  Yes Date Medicare IM Given:    Medicare IM give by:    Date Additional Medicare IM Given:    Additional Medicare Important Message give by:     If discussed at [place] of Stay Meetings, dates discussed:    Additional Comments: Pt discharged to [facility] today. CSW to arrange discharge to facility. Christinia Gully Kincaid, RN 07/14/2015, 1:10 PM

## 2015-07-06 NOTE — Progress Notes (Signed)
Patient discharged to [facility] called,and given to [facility] Sanfer[facility]ando De Fairmead RN. Family aware. Accompanied by staff to awaiting facility.

## 2015-07-06 NOTE — Discharge Summary (Signed)
Physician Discharge Summary  Tracy [name] GEX:[id number] DOB: 03/28/1928 DOA: 06/28/2015  PCP: [name], MD  Admit date: 06/28/2015 Discharge date: 07/04/2015   Recommendations for Outpatient Follow-up:  The Tracy [name] is discharged to skilled nursing facility to monitor her renal function and her congestive heart failure in the face of moderate aortic stenosis as well as a chronic atrial fibrillation for strengthening and ambulation she is advised to take one of her discharge medicines and have been met 2 times a week to assess renal function Discharge Diagnoses:  Principal Problem:   Acute renal failure (ARF) ([place]) Active Problems:   Dyslipidemia   Essential hypertension   ATRIAL FIBRILLATION, CHRONIC   Acute diastolic heart failure-(recurrent)   Bladder cancer- surgery 07/19/14   Elevated troponin   Discharge Condition: Good  Filed Weights   06/28/15 1337 07/05/15 0400 07/05/2015 0549  Weight: 136 lb 7.4 oz (61.9 kg) 147 lb 9.6 oz (66.951 kg) 153 lb 7 oz (69.6 kg)    History of present illness:    [name] was admitted with some dyspnea and worsening acute on chronic renal failure creatinine was 3.5 in consultation by cardiology as well as nephrology sleep given diuresis 2-D echocardiogram revealed moderate aortic stenosis and it was felt that she had mostly COPD exacerbation was treated with aggressive nebulizer therapy and steroids and she continued to improve her worsening renal failure responded only minimally to fluid resuscitation and diuresis both during this hospitalization creatinine remained about 3.5 she did become weak and deconditioned while in hospital as well as prior to admission was felt best that she could benefit from a skilled nursing facility rehabilitation stay for strengthening ambulation and 12 monitor her renal function. She is advised to have a B med 2 times a week to monitor her renal function while in skilled nursing facility as well as  aggressive physical therapyospital Course:  See history of present illness above  Procedures:    Consultations:  Cardiology and nephrology  Discharge Instructions  Discharge Instructions    Discharge instructions    Complete by:  As directed      Discharge Tracy [name]    Complete by:  As directed             Medication List    STOP taking these medications        acetaminophen 500 MG tablet  Commonly known as:  TYLENOL     diltiazem 120 MG 24 hr capsule  Commonly known as:  CARDIZEM CD     DSS 100 MG Caps     furosemide 40 MG tablet  Commonly known as:  LASIX     meclizine 12.5 MG tablet  Commonly known as:  ANTIVERT     multivitamins ther. w/minerals Tabs tablet      TAKE these medications        amLODipine 10 MG tablet  Commonly known as:  NORVASC  Take 1 tablet (10 mg total) by mouth daily.     calcium carbonate 600 MG Tabs tablet  Commonly known as:  OS-CAL  Take 600 mg by mouth daily at 12 noon.     cloNIDine 0.2 MG tablet  Commonly known as:  CATAPRES  Take 1 tablet (0.2 mg total) by mouth 2 (two) times daily.     ferrous sulfate 325 (65 FE) MG tablet  Take 1 tablet (325 mg total) by mouth daily with breakfast.     metoprolol tartrate 25 MG tablet  Commonly known as:  LOPRESSOR  Take 1 tablet (25 mg total) by mouth 2 (two) times daily.     rosuvastatin 20 MG tablet  Commonly known as:  CRESTOR  Take 20 mg by mouth daily after lunch.       Allergies  Allergen Reactions  . Amoxicillin   . Penicillins   . Sulfonamide Derivatives       The results of significant diagnostics from this hospitalization (including imaging, microbiology, ancillary and laboratory) are listed below for reference.    Significant Diagnostic Studies: Dg Chest 2 View  07/04/2015  CLINICAL DATA:  Shortness of breath, bladder cancer EXAM: CHEST  2 VIEW COMPARISON:  CT chest dated 06/28/2015 FINDINGS: Small to moderate bilateral pleural effusions. Associated patchy  bilateral lower lobe opacities, likely compressive atelectasis. Known right lower lobe pulmonary nodule from recent chest radiograph/ CT is obscured. No frank interstitial edema.  No pneumothorax. The heart is top-normal in size/ mildly enlarged. Degenerative changes of the visualized thoracolumbar spine. IMPRESSION: Small to moderate bilateral pleural effusions, increased. Associated patchy bilateral lower lobe opacities, likely compressive atelectasis. No frank interstitial edema. Known right lower lobe pulmonary nodule from recent chest radiograph/CT is obscured. Electronically Signed   By: Julian Hy M.D.   On: 07/04/2015 10:27   Dg Chest 2 View  06/28/2015  CLINICAL DATA:  Shortness of Breath EXAM: CHEST  2 VIEW COMPARISON:  12/15/2014 FINDINGS: Cardiomegaly again noted. Central mild vascular congestion without convincing pulmonary edema. Osteopenia and degenerative changes thoracic spine. Bilateral small pleural effusion with bilateral basilar atelectasis left greater than right. There is poorly visualized nodular density right base measures 2 cm. A lung nodule cannot be excluded. Further correlation with CT scan of the chest is recommended. Atherosclerotic calcifications of thoracic aorta. IMPRESSION: Central mild vascular congestion without convincing pulmonary edema. Osteopenia and degenerative changes thoracic spine. Bilateral small pleural effusion with bilateral basilar atelectasis left greater than right. There is poorly visualized nodular density right base measures 2 cm. A lung nodule cannot be excluded. Further correlation with CT scan of the chest is recommended. Electronically Signed   By: Lahoma Crocker M.D.   On: 06/28/2015 09:52   Ct Chest Wo Contrast  06/28/2015  CLINICAL DATA:  Abnormal chest x-ray EXAM: CT CHEST WITHOUT CONTRAST TECHNIQUE: Multidetector CT imaging of the chest was performed following the standard protocol without IV contrast. COMPARISON:  06/28/2015 x-ray of the  chest FINDINGS: Sagittal images of the spine shows osteopenia and degenerative changes thoracic spine. There is dextroscoliosis and mild kyphosis of thoracic spine. Extensive atherosclerotic calcifications of thoracic aorta. Atherosclerotic calcifications of coronary arteries. Study is limited without IV contrast. A precarinal lymph node measures 9 mm short-axis. No significant hilar adenopathy. Central airways are patent. There is cardiomegaly. Bilateral small pleural effusion. There is right base atelectasis. Left base atelectasis or infiltrate. As noted on chest x-ray there is a nodular lesion in right lower lobe posteriorly. This is best visualized in sagittal image 20 measures 1.6 by 1.5 cm. Malignancy cannot be excluded. Further correlation with PET scan and/or biopsy is recommended. There is a nodule in superior segment of right lower lobe measures 8 mm. Second nodule in superior segment of right lower lobe measures 6 mm. A nodule in right upper lobe laterally just anterior to the fissure measures 7 mm. Metastatic disease cannot be excluded. There is a pleural-based nodule in left upper lobe posteriorly measures 8 mm. There is no pulmonary edema. Atherosclerotic calcifications of abdominal aorta. No adrenal gland mass is noted in visualized  upper abdomen. Atherosclerotic calcifications of splenic artery. IMPRESSION: 1. As noted on chest x-ray there is a nodular lesion in right lower lobe posteriorly. This is best seen in sagittal image 20 measures 1.6 by 1.5 cm. Malignancy cannot be excluded. Further correlation with PET scan and/or biopsy is recommended. 2. No mediastinal or hilar adenopathy. 3. Additional smaller nodules are noted bilaterally. Metastatic disease cannot be excluded. 4. Atherosclerotic calcifications of thoracic and abdominal aorta. Atherosclerotic calcifications of coronary arteries. 5. Degenerative changes thoracic spine. 6. Bilateral small pleural effusion. There is atelectasis in right  lower lobe posteriorly. Atelectasis or infiltrate in left lower lobe posteriorly. 7. Cardiomegaly is noted. Electronically Signed   By: Lahoma Crocker M.D.   On: 06/28/2015 11:47   US Renal  06/28/2015  CLINICAL DATA:  Acute renal failure, history of bladder cancer EXAM: RENAL / URINARY TRACT ULTRASOUND COMPLETE COMPARISON:  06/25/2012 Ho FINDINGS: Right Kidney: Length: 8.9 cm. Echogenicity within normal limits. No mass or hydronephrosis visualized. Left Kidney: Length: 10.2 cm.  Mild left hydronephrosis. No left renal calculus. Bladder: No urinary jets are visualized. There is a lesion in left posterior aspect of the urinary bladder measures 5 by 3.9 cm. This is probable the known bladder mass. IMPRESSION: 1. Unremarkable right kidney. 2. There is very mild left hydronephrosis.  No left renal calculus. 3. There is a lesion/filling defect within left aspect of urinary bladder probable the known mass. Measures at least 5 x 3.9 x 4.5 cm. Clinical correlation is necessary. Electronically Signed   By: Lahoma Crocker M.D.   On: 06/28/2015 12:39   Mm Screening Breast Tomo Bilateral  06/18/2015  CLINICAL DATA:  Screening. EXAM: DIGITAL SCREENING BILATERAL MAMMOGRAM WITH 3D TOMO WITH CAD COMPARISON:  Previous exam(s). ACR Breast Density Category d: The breast tissue is extremely dense, which lowers the sensitivity of mammography. FINDINGS: There are no findings suspicious for malignancy. Images were processed with CAD. IMPRESSION: No mammographic evidence of malignancy. A result letter of this screening mammogram will be mailed directly to the Tracy [name]. RECOMMENDATION: Screening mammogram in one year. (Code:SM-B-01Y) BI-RADS CATEGORY  1: Negative. Electronically Signed   By: Skipper Cliche M.D.   On: 06/18/2015 13:00    Microbiology: No results found for this or any previous visit (from the past 240 hour(s)).   Labs: Basic Metabolic Panel:  Recent Labs Lab 07/02/15 0609 07/03/15 0549 07/04/15 0554 07/05/15 1048  06/25/2015 0623  NA 139 139 137 136 136  K 4.9 4.6 4.7 4.8 4.7  CL 112* 108 106 105 106  CO2 22 25 24 24  21*  GLUCOSE 109* 103* 109* 107* 100*  BUN 64* 67* 66* 69* 71*  CREATININE 3.56* 3.67* 3.83* 3.77* 3.89*  CALCIUM 8.4* 8.6* 8.7* 8.6* 8.6*  PHOS  --   --  3.8  --   --    Liver Function Tests: No results for input(s): AST, ALT, ALKPHOS, BILITOT, PROT, ALBUMIN in the last 168 hours. No results for input(s): LIPASE, AMYLASE in the last 168 hours. No results for input(s): AMMONIA in the last 168 hours. CBC:  Recent Labs Lab 07/03/15 0549  WBC 4.9  HGB 9.3*  HCT 28.6*  MCV 83.9  PLT 136*   Cardiac Enzymes: No results for input(s): CKTOTAL, CKMB, CKMBINDEX, TROPONINI in the last 168 hours. BNP: BNP (last 3 results)  Recent Labs  08/15/14 0342 08/20/14 0228 06/28/15 0916  BNP 719.0* 466.0* 998.0*    ProBNP (last 3 results) No results for input(s): PROBNP in the  last 8760 hours.  CBG:  Recent Labs Lab 06/29/15 1610 06/29/15 2052 06/30/15 0745 06/30/15 1202 06/30/15 1643  GLUCAP 98 100* 108* 128* 110*       Signed:  Adream Parzych M  [facility] Pager: [phone] 07/14/2015, 1:11 PM

## 2015-07-06 NOTE — Care Management Important Message (Signed)
Important Message  Patient Details  Name: Tracy [name] MRN: [id number] Date of Birth: Dec 04, 1927   Medicare Important Message Given:  Yes    Joylene Draft, RN 06/24/2015, 1:10 PM

## 2015-07-06 NOTE — Clinical Social Work Note (Signed)
CSW facilitated discharge.   CSW notified patient's daughter, [name], that patient was being discharged and would be transported to [facility] today.  CSW notified Tami at [facility] that patient was being discharged and would be transported facility by hospital staff.  CSW signing off.  Ihor Gully, [place] [phone]

## 2015-07-06 NOTE — Progress Notes (Signed)
Subjective: Patient still complains of exertional dyspnea. At this moment there is no significant change. She denies any nausea or vomiting.  Objectives: Temp:  [97 F (36.1 C)-98.4 F (36.9 C)] 97.1 F (36.2 C) (12/16 0549) Pulse Rate:  [56-71] 62 (12/16 0549) Resp:  [18-20] 18 (12/16 0549) BP: (127-165)/(58-89) 134/66 mmHg (12/16 0549) SpO2:  [92 %-99 %] 92 % (12/16 0549) Weight:  [153 lb 7 oz (69.6 kg)] 153 lb 7 oz (69.6 kg) (12/16 0549)  Intake/Output from previous day: 12/15 0701 - 12/16 0700 In: 480 [P.O.:480] Out: 200 [Urine:200] Intake/Output this shift:    No results for input(s): HGB in the last 72 hours. No results for input(s): WBC, RBC, HCT, PLT in the last 72 hours.  Recent Labs  07/05/15 1048 07/03/2015 0623  NA 136 136  K 4.8 4.7  CL 105 106  CO2 24 21*  BUN 69* 71*  CREATININE 3.77* 3.89*  GLUCOSE 107* 100*  CALCIUM 8.6* 8.6*   No results for input(s): LABPT, INR in the last 72 hours.  Generally patient is alert in no apparent distress. patient is sitting on a chair.  Chest: Decreased breath sound bilaterally Heart exam revealed irregular rate and rhythm no murmur Extremities no edema  Assessment/Plan: Problem #1 acute kidney injury:  Patient is non-oliguric.  patient is asymptomatic however her BUN and creatinine are still increasing. Her potassium is normal. Patient is nonoliguric and no nausea or vomiting.  Problem #2 chronic renal failure stage III Problem #3 Atrial  fibrillation: Her heart rate is controlled.  Problem #4 history of bladder cancer c status post surgery. patient is followed in neurology. Problem #5 history of aortic stenosis Problem #6 history of difficulty breathing: presently no significant change. patient on oxygen. Essential complaints of exertional dyspnea.  Problem #7 anemia.: Her hemoglobin is low but stable.  Problem #8 hypertension: Her blood pressure   is reasonably controlled.  Problem #9 metabolic bone disease: Her  calcium and phosphorus is range. Plan: We'll continue his present management.  2] basic metabolic panel in the morning.  Tracy [name] S 06/22/2015, 8:45 AM

## 2015-07-06 NOTE — Progress Notes (Signed)
Physical Therapy Treatment Patient Details Name: Tracy [name] MRN: [id number] DOB: 11/06/1927 Today's Date: 07/07/2015    History of Present Illness Tracy [name] is a 81 y.o. female, with past medical history of bladder cancer, status post TURBT x 3, most recent in 05/03/2015 (Dr. Tresa Moore), history of A. Fib(anticoagulation been stopped secondary to hematuria) aortic stenosis, chronic diastolic CHF, history of CAD, hyperlipidemia, presents with complaints of progressive dyspnea, report developed over the last 24 hours, worse upon exertion and laying supine, reports she has been compliant with her Lasix, as well reports mild lower extremity edema, workup in ED significant for elevated BNP, and volume overload on chest x-ray, as well workup was significant for acute renal failure, with creatinine of 3.8, no evidence of urinary retention, bladder scan of 130 ml, and reports episodes of intermittent chest pain last few seconds, currently chest pain-free. patient was given 20 mg of IV Lasix in ED, hospitalist requested to admit.    PT Comments    Pt was fairly upset at the time of my arrival.  States that she is going to have to have kidney dialysis initiated.  She was agreeable to work with me although she demonstrated increased fatigue and decereased ambulatory endurance.    She did require some assist with all exercises.  Some of her weakness may be emotionally induced.  Follow Up Recommendations  SNF     Equipment Recommendations  None recommended by PT    Recommendations for Other Services  OT     Precautions / Restrictions Precautions Precautions: Fall Restrictions Weight Bearing Restrictions: No    Mobility  Bed Mobility Overal bed mobility: Needs Assistance Bed Mobility: Supine to Sit;Sit to Supine     Supine to sit: Min guard Sit to supine: Mod assist   General bed mobility comments: unable to lift LEs into the bed  Transfers Overall transfer level: Needs  assistance Equipment used: 4-wheeled walker             General transfer comment: instructed in using UEs to proprel weight forward  Ambulation/Gait Ambulation/Gait assistance: Supervision Ambulation Distance (Feet): 100 Feet Assistive device: 4-wheeled walker Gait Pattern/deviations: Decreased stride length;Trunk flexed;Decreased dorsiflexion - right;Decreased dorsiflexion - left   Gait velocity interpretation: <1.8 ft/sec, indicative of risk for recurrent falls General Gait Details: 2 rests needed during gait due to increased fatigue today   Stairs            Wheelchair Mobility    Modified Rankin (Stroke Patients Only)       Balance     Sitting balance-Leahy Scale: Good       Standing balance-Leahy Scale: Fair                      Cognition Arousal/Alertness: Awake/alert Behavior During Therapy: WFL for tasks assessed/performed Overall Cognitive Status: Within Functional Limits for tasks assessed                      Exercises General Exercises - Lower Extremity Ankle Circles/Pumps: AROM;Both;10 reps;Supine Quad Sets: AROM;Both;10 reps;Supine Gluteal Sets: AROM;Both;10 reps;Supine Short Arc Quad: AROM;AAROM;Both;10 reps;Supine Heel Slides: AAROM;Both;10 reps;Supine Hip ABduction/ADduction: AAROM;Both;10 reps;Supine    General Comments        Pertinent Vitals/Pain Pain Assessment: No/denies pain    Home Living                      Prior Function  PT Goals (current goals can now be found in the care plan section) Progress towards PT goals: Not progressing toward goals - comment (increased fatigue)    Frequency  Min 3X/week    PT Plan Current plan remains appropriate    Co-evaluation             End of Session Equipment Utilized During Treatment: Gait belt;Oxygen Activity Tolerance: Patient limited by fatigue Patient left: in chair;with call bell/phone within reach     Time: 1236-1314 PT  Time Calculation (min) (ACUTE ONLY): 38 min  Charges:  $Gait Training: 8-22 mins $Therapeutic Exercise: 8-22 mins                    G CodesSable Feil  PT 07/01/2015, 1:42 PM [phone]

## 2015-07-09 ENCOUNTER — Encounter (HOSPITAL_COMMUNITY)
Admission: RE | Admit: 2015-07-09 | Discharge: 2015-07-09 | Disposition: A | Discharge status: Home / Self Care | Payer: Medicare Other | Source: Skilled Nursing Facility | Attending: Internal Medicine | Admitting: Internal Medicine

## 2015-07-09 ENCOUNTER — Non-Acute Institutional Stay (SKILLED_NURSING_FACILITY): Payer: Medicare Other | Admitting: Internal Medicine

## 2015-07-09 DIAGNOSIS — I5031 Acute diastolic (congestive) heart failure: Secondary | ICD-10-CM | POA: Diagnosis not present

## 2015-07-09 DIAGNOSIS — I482 Chronic atrial fibrillation: Secondary | ICD-10-CM | POA: Diagnosis not present

## 2015-07-09 DIAGNOSIS — I10 Essential (primary) hypertension: Secondary | ICD-10-CM | POA: Diagnosis not present

## 2015-07-09 DIAGNOSIS — N179 Acute kidney failure, unspecified: Secondary | ICD-10-CM

## 2015-07-09 DIAGNOSIS — N183 Chronic kidney disease, stage 3 unspecified: Secondary | ICD-10-CM

## 2015-07-09 DIAGNOSIS — I4821 Permanent atrial fibrillation: Secondary | ICD-10-CM

## 2015-07-09 LAB — CBC
HCT: 28 % — ABNORMAL LOW (ref 36.0–46.0)
Hemoglobin: 9.2 g/dL — ABNORMAL LOW (ref 12.0–15.0)
MCH: 27.1 pg (ref 26.0–34.0)
MCHC: 32.9 g/dL (ref 30.0–36.0)
MCV: 82.4 fL (ref 78.0–100.0)
PLATELETS: 144 10*3/uL — AB (ref 150–400)
RBC: 3.4 MIL/uL — ABNORMAL LOW (ref 3.87–5.11)
RDW: 22.3 % — AB (ref 11.5–15.5)
WBC: 5.9 10*3/uL (ref 4.0–10.5)

## 2015-07-09 LAB — BASIC METABOLIC PANEL
Anion gap: 7 (ref 5–15)
BUN: 71 mg/dL — AB (ref 6–20)
CHLORIDE: 109 mmol/L (ref 101–111)
CO2: 21 mmol/L — AB (ref 22–32)
CREATININE: 3.85 mg/dL — AB (ref 0.44–1.00)
Calcium: 9.3 mg/dL (ref 8.9–10.3)
GFR calc Af Amer: 11 mL/min — ABNORMAL LOW (ref >60)
GFR calc non Af Amer: 10 mL/min — ABNORMAL LOW (ref >60)
GLUCOSE: 103 mg/dL — AB (ref 65–99)
Potassium: 5.2 mmol/L — ABNORMAL HIGH (ref 3.5–5.1)
SODIUM: 137 mmol/L (ref 135–145)

## 2015-07-09 NOTE — Progress Notes (Signed)
Patient ID: Tracy [name], female   DOB: February 05, 1928, 82 y.o.   MRN: [id number]    Facility; [facility] Chief complaint; admission to SNF post stay at [facility] 12/8through 07/01/2015 History;this is an 81 year old woman who tells me she became more short of breath until she was brought  She was found to be in acute renal failure. Baseline creatinine on August 26 was 1.38she was admitted with a creatinine of 3.5.She was seen by cardiology and nephrology and given diuresis. 2-D echocardiogram revealed moderate aortic stenosis was also felt she had COPD acute. She was diuresed. Renal ultrasound showed a lesion in the posterior aspect of the urinary bladder measuring 5 x 3.9 cm.She had an unremarkable right kidney mild left hydronephrosisno renal calculus. CT scan of the chest showed a nodule in the right lower lobe posteriorlya PET scan or or biopsy was recommended No mediastinal or hilar adenopathy. Interestingly she was not discharged on any Lasix  BMP Latest Ref Rng 07/09/2015 07/03/2015 07/05/2015  Glucose 65 - 99 mg/dL 103(H) 100(H) 107(H)  BUN 6 - 20 mg/dL 71(H) 71(H) 69(H)  Creatinine 0.44 - 1.00 mg/dL 3.85(H) 3.89(H) 3.77(H)  Sodium 135 - 145 mmol/L 137 136 136  Potassium 3.5 - 5.1 mmol/L 5.2(H) 4.7 4.8  Chloride 101 - 111 mmol/L 109 106 105  CO2 22 - 32 mmol/L 21(L) 21(L) 24  Calcium 8.9 - 10.3 mg/dL 9.3 8.6(L) 8.6(L)   CBC Latest Ref Rng 07/09/2015 07/03/2015 06/29/2015  WBC 4.0 - 10.5 K/uL 5.9 4.9 5.5  Hemoglobin 12.0 - 15.0 g/dL 9.2(L) 9.3(L) 11.1(L)  Hematocrit 36.0 - 46.0 % 28.0(L) 28.6(L) 34.8(L)  Platelets 150 - 400 K/uL 144(L) 136(L) 157    Past Medical History  Diagnosis Date  . Hyperlipidemia   . Coronary artery disease     Nonobstructive at cardiac catheterizatio 2003  . Venous insufficiency   . Diastolic heart failure ([place])   . GERD (gastroesophageal reflux disease)   . Mitral valve prolapse   . Shoulder pain, right   . Headache   . Arthritis   . Sleeping  difficulties   . Atrial fibrillation ([place])   . Bladder cancer ([place])   . History of blood transfusion 02/2015    Received 1 unit PRBCs d/t hemorrhage in bladder s/p TUBRT and warfarin  . Aortic stenosis     Mild   Past Surgical History  Procedure Laterality Date  . Colonoscopy N/A 09/30/2012    Procedure: COLONOSCOPY;  Surgeon: Rogene Houston, MD;  Location: [facility];  Service: Endoscopy;  Laterality: N/A;  930  . Cardioversion      x2  . Back surgery      x2-lower back  . Transurethral resection of bladder tumor with gyrus (turbt-gyrus) N/A 07/19/2014    Procedure: TRANSURETHRAL RESECTION OF BLADDER TUMOR WITH GYRUS (TURBT-GYRUS) WITH CYSTOGRAM;  Surgeon: Alexis Frock, MD;  Location: [facility];  Service: Urology;  Laterality: N/A;  . Transurethral resection of bladder tumor with gyrus (turbt-gyrus) N/A 07/20/2014    Procedure: SECOND LOOK TRANSURETHRAL RESECTION OF BLADDER TUMOR WITH GYRUS (TURBT-GYRUS);  Surgeon: Alexis Frock, MD;  Location: [facility];  Service: Urology;  Laterality: N/A;  . Cataract extraction    . Transurethral resection of bladder tumor with gyrus (turbt-gyrus) N/A 12/20/2014    Procedure: TRANSURETHRAL RESECTION OF BLADDER TUMOR ;  Surgeon: Alexis Frock, MD;  Location: [facility];  Service: Urology;  Laterality: N/A;  . Cystoscopy w/ retrogrades Bilateral 12/20/2014    Procedure: CYSTOSCOPY WITH RETROGRADE PYELOGRAM;  Surgeon: Alexis Frock, MD;  Location: [facility];  Service: Urology;  Laterality: Bilateral;  . Transurethral resection of bladder tumor with gyrus (turbt-gyrus) N/A 05/03/2015    Procedure: TRANSURETHRAL RESECTION OF BLADDER TUMOR;  Surgeon: Alexis Frock, MD;  Location: [facility];  Service: Urology;  Laterality: N/A;  . Cystoscopy w/ retrogrades Bilateral 05/03/2015    Procedure: CYSTOSCOPY WITH RETROGRADE PYELOGRAM;  Surgeon: Alexis Frock, MD;  Location: [facility];  Service: Urology;  Laterality: Bilateral;     Current Outpatient Prescriptions on File Prior to Visit  Medication Sig Dispense Refill  . amLODipine (NORVASC) 10 MG tablet Take 1 tablet (10 mg total) by mouth daily. 30 tablet 1  . calcium carbonate (OS-CAL) 600 MG TABS Take 600 mg by mouth daily at 12 noon.     . cloNIDine (CATAPRES) 0.2 MG tablet Take 1 tablet (0.2 mg total) by mouth 2 (two) times daily. 60 tablet 1  . ferrous sulfate 325 (65 FE) MG tablet Take 1 tablet (325 mg total) by mouth daily with breakfast. 30 tablet 3  . metoprolol tartrate (LOPRESSOR) 25 MG tablet Take 1 tablet (25 mg total) by mouth 2 (two) times daily. 60 tablet 6  . rosuvastatin (CRESTOR) 20 MG tablet Take 20 mg by mouth daily after lunch.       Social; patient states she lives on her own in [place]. She uses a walker.Sounds as though she has fairly significant in the home family support. She is not on oxygen at home  reports that she has never smoked. She has never used smokeless tobacco. She reports that she does not drink alcohol or use illicit drugs.  Family History  Problem Relation Age of Onset  . Colon cancer Neg Hx   . Colon polyps Sister     Review of systems hEENT No visual complaints Respiratory; patient complains of shortness of breath no cough. ot on oxygen at home Cardiac no clear chest pain GI; No vomiting, no diarrhea. No abdominal pain GU no voiding difficulties Musculoskeletal; atient states that she  While being positioned for an x-ray? CT scan and now has difficulty moving her right shoulder Neurologic: no gait ataxia, no focal weakness Mental Status: omplaints of depression Gait: no tested Endocrine no diabetes history  Physical examination Gen.O2 sat is 97% on 3 L, respirations 22pulse rate 69 HEENT oral exam is normal Respiratory;kyphotic shallow but otherwise clear air entry no wheezingno crackles Cardiac; midsystolic Ejection murmur JVP isn't visible at 90 no S3 Abdomen; slightly distended no liver  no spleen no masses no tenderness GU bladder not distended no CVA tenderness Extremities; 3+ edema continuing above the knees into her thighs posteriorly extending up her back Musculoskeletal; steoarthritis of both knees It would appear that she has had a right rotator cuff tear but there is no effusion in the rightshoulder not certain how new this is. There is edemain her right hand Neurologic;  obvious lateralizing weakness Limited range of motion in the right shoulder is noted. Mental status; I see no obvious cognitive issues. No depression or delirium  Impression/plan #1acute on chronic renal failure. If the exact etiology of this is been determined I am not clearwhat it is. However for a frail 74 year oldthis is fairly significant. She has massive edema in her legs extending up herlow back. Not clear why she was not discharged on any diuretics. As mentioned in August her creatinine was 1.38. Estimated GFR at the time of 34 #2 felt to have  COPD acute in the hospital and was treated for this. She has no wheezing #3 Chronic afib. Cardiazem was discntinued.  #4 diastolic chf.  #5 Bladder cancer sx in 2015; appears to have a bladder mass.  #6 hypertension; will monitor. Don't believe norvasc best choice of agents.  #7 rotator cuff tear. ?acute vs chronic. edmea of the right arm?? Will need to review when she is in bed #8 RLL lung nodule. I am not planning to work this up further while she is here

## 2015-07-11 ENCOUNTER — Encounter (HOSPITAL_COMMUNITY)
Admission: AD | Admit: 2015-07-11 | Discharge: 2015-07-11 | Disposition: A | Discharge status: Home / Self Care | Payer: Medicare Other | Source: Skilled Nursing Facility | Attending: Internal Medicine | Admitting: Internal Medicine

## 2015-07-11 ENCOUNTER — Non-Acute Institutional Stay (SKILLED_NURSING_FACILITY): Payer: Medicare Other | Admitting: Internal Medicine

## 2015-07-11 DIAGNOSIS — N183 Chronic kidney disease, stage 3 unspecified: Secondary | ICD-10-CM

## 2015-07-11 DIAGNOSIS — N179 Acute kidney failure, unspecified: Secondary | ICD-10-CM | POA: Diagnosis not present

## 2015-07-11 LAB — BASIC METABOLIC PANEL
ANION GAP: 6 (ref 5–15)
BUN: 80 mg/dL — ABNORMAL HIGH (ref 6–20)
CHLORIDE: 108 mmol/L (ref 101–111)
CO2: 21 mmol/L — ABNORMAL LOW (ref 22–32)
Calcium: 9.1 mg/dL (ref 8.9–10.3)
Creatinine, Ser: 3.99 mg/dL — ABNORMAL HIGH (ref 0.44–1.00)
GFR, EST AFRICAN AMERICAN: 11 mL/min — AB (ref >60)
GFR, EST NON AFRICAN AMERICAN: 9 mL/min — AB (ref >60)
Glucose, Bld: 102 mg/dL — ABNORMAL HIGH (ref 65–99)
POTASSIUM: 5.5 mmol/L — AB (ref 3.5–5.1)
SODIUM: 135 mmol/L (ref 135–145)

## 2015-07-16 ENCOUNTER — Encounter (HOSPITAL_COMMUNITY)
Admission: RE | Admit: 2015-07-16 | Discharge: 2015-07-16 | Disposition: A | Discharge status: Home / Self Care | Payer: Medicare Other | Source: Skilled Nursing Facility | Attending: Internal Medicine | Admitting: Internal Medicine

## 2015-07-16 ENCOUNTER — Non-Acute Institutional Stay (SKILLED_NURSING_FACILITY): Payer: Medicare Other | Admitting: Internal Medicine

## 2015-07-16 DIAGNOSIS — N183 Chronic kidney disease, stage 3 unspecified: Secondary | ICD-10-CM

## 2015-07-16 DIAGNOSIS — I5031 Acute diastolic (congestive) heart failure: Secondary | ICD-10-CM

## 2015-07-16 DIAGNOSIS — N170 Acute kidney failure with tubular necrosis: Secondary | ICD-10-CM

## 2015-07-16 DIAGNOSIS — I10 Essential (primary) hypertension: Secondary | ICD-10-CM | POA: Diagnosis not present

## 2015-07-16 LAB — BASIC METABOLIC PANEL
Anion gap: 8 (ref 5–15)
BUN: 70 mg/dL — AB (ref 6–20)
CHLORIDE: 104 mmol/L (ref 101–111)
CO2: 26 mmol/L (ref 22–32)
Calcium: 8.6 mg/dL — ABNORMAL LOW (ref 8.9–10.3)
Creatinine, Ser: 4.14 mg/dL — ABNORMAL HIGH (ref 0.44–1.00)
GFR, EST AFRICAN AMERICAN: 10 mL/min — AB (ref >60)
GFR, EST NON AFRICAN AMERICAN: 9 mL/min — AB (ref >60)
Glucose, Bld: 106 mg/dL — ABNORMAL HIGH (ref 65–99)
POTASSIUM: 3.1 mmol/L — AB (ref 3.5–5.1)
SODIUM: 138 mmol/L (ref 135–145)

## 2015-07-16 LAB — CBC WITH DIFFERENTIAL/PLATELET
BASOS PCT: 0 %
Basophils Absolute: 0 10*3/uL (ref 0.0–0.1)
EOS ABS: 0.1 10*3/uL (ref 0.0–0.7)
Eosinophils Relative: 1 %
HCT: 26.2 % — ABNORMAL LOW (ref 36.0–46.0)
HEMOGLOBIN: 8.7 g/dL — AB (ref 12.0–15.0)
LYMPHS ABS: 0.8 10*3/uL (ref 0.7–4.0)
LYMPHS PCT: 12 %
MCH: 27.4 pg (ref 26.0–34.0)
MCHC: 33.2 g/dL (ref 30.0–36.0)
MCV: 82.6 fL (ref 78.0–100.0)
MONO ABS: 0.5 10*3/uL (ref 0.1–1.0)
MONOS PCT: 8 %
NEUTROS PCT: 78 %
Neutro Abs: 5 10*3/uL (ref 1.7–7.7)
Platelets: 131 10*3/uL — ABNORMAL LOW (ref 150–400)
RBC: 3.17 MIL/uL — ABNORMAL LOW (ref 3.87–5.11)
RDW: 22.7 % — AB (ref 11.5–15.5)
WBC: 6.5 10*3/uL (ref 4.0–10.5)

## 2015-07-16 NOTE — Progress Notes (Signed)
Patient ID: Tracy [name], female   DOB: [birth year], 81 y.o.   MRN: [id number] Facility; [facility] Chief complaint; low potassium, worsening chronic renal failure History; this is a patient who was admitted to hospital withacute on chronic renal failure and congestive heart failure in the face of moderate aortic stenosis and chronic atrial fibrillation. When she first arrived here she had anasarca extending up into her abdominal flanksa. I have had her on 40 mg of Lasix sinceand her BUN and creatinine are gradually increasing today up to 70 and 4.14 respectively. In response to a potassium of 5.5 last week I put her on Kayexalate 30 g every second day and her potassium is down to 3.1 today.The Kayexalate will obviously need to temporarily stop and she'll need to have some potassium replacement.With regards to her weight, today her weight is 160.8 pounds this is down 1.8 pounds since 12/23 but is not shown any consistent trend downward The patient complains of shortness of breath on oxygen. A renal ultrasound done while she was in the hospitalon 12/8 showed a unremarkable right kidney. There was very mild left hydronephrosis no left renal calculus There was a lesionfilling deficit within theleft aspect of the urinary bladder She has a known history of bladder cancer. He also has significant aortic stenosis.  BMP Latest Ref Rng 07/16/2015 07/11/2015 07/09/2015  Glucose 65 - 99 mg/dL 106(H) 102(H) 103(H)  BUN 6 - 20 mg/dL 70(H) 80(H) 71(H)  Creatinine 0.44 - 1.00 mg/dL 4.14(H) 3.99(H) 3.85(H)  Sodium 135 - 145 mmol/L 138 135 137  Potassium 3.5 - 5.1 mmol/L 3.1(L) 5.5(H) 5.2(H)  Chloride 101 - 111 mmol/L 104 108 109  CO2 22 - 32 mmol/L 26 21(L) 21(L)  Calcium 8.9 - 10.3 mg/dL 8.6(L) 9.1 9.3   CBC Latest Ref Rng 07/16/2015 07/09/2015 07/03/2015  WBC 4.0 - 10.5 K/uL 6.5 5.9 4.9  Hemoglobin 12.0 - 15.0 g/dL 8.7(L) 9.2(L) 9.3(L)  Hematocrit 36.0 - 46.0 % 26.2(L) 28.0(L) 28.6(L)  Platelets 150 - 400 K/uL  131(L) 144(L) 136(L)         CLINICAL DATA:  Shortness of breath, bladder cancer   EXAM: CHEST  2 VIEW   COMPARISON:  CT chest dated 06/28/2015   FINDINGS: Small to moderate bilateral pleural effusions. Associated patchy bilateral lower lobe opacities, likely compressive atelectasis.   Known right lower lobe pulmonary nodule from recent chest radiograph/ CT is obscured.   No frank interstitial edema.  No pneumothorax.   The heart is top-normal in size/ mildly enlarged.   Degenerative changes of the visualized thoracolumbar spine.   IMPRESSION: Small to moderate bilateral pleural effusions, increased.   Associated patchy bilateral lower lobe opacities, likely compressive atelectasis.   No frank interstitial edema.                           [address] [place], [place] [address]                            [id number]  ------------------------------------------------------------------- Transthoracic Echocardiography  Patient:    Tracy [name], Tracy [name] MR #:  [id number] Study Date: 06/29/2015 Gender:     F Age:        65 Height:     160 cm Weight:     61.7 kg BSA:        1.67 m^2 Pt. Status: Room:       [id number]   Alger Simons, M.D.  REFERRING    Rozann Lesches, M.D.  ATTENDING    Mcinnis, Angus G  ADMITTING    Elgergawy, Dawood S  PERFORMING   Chmg, Forestine Na  SONOGRAPHER  [employer], RCS  cc:  ------------------------------------------------------------------- LV EF: 55% -   60%  ------------------------------------------------------------------- Indications:      Dyspnea 786.09.  ------------------------------------------------------------------- Study Conclusions  - Left ventricle: The cavity size was normal. Wall thickness was   increased in a pattern of mild LVH. Systolic function was normal.   The estimated ejection fraction was in the range of 55%  to 60%.   Wall motion was normal; there were no regional wall motion   abnormalities. The study was not technically sufficient to allow   evaluation of LV diastolic dysfunction due to atrial   fibrillation. Doppler parameters are consistent with high   ventricular filling pressure. - Aortic valve: Moderately calcified annulus. Trileaflet; mildly   thickened, mildly calcified leaflets. There was mild to moderate   stenosis. There was mild regurgitation. Peak velocity (S): 237   cm/s. Mean gradient (S): 13 mm Hg. Valve area (VTI): 0.73 cm^2.   Valve area (Vmax): 1 cm^2. Valve area (Vmean): 0.81 cm^2. - Mitral valve: There was moderate regurgitation. - Left atrium: The atrium was severely dilated. - Right ventricle: Systolic function was mildly reduced. - Right atrium: The atrium was moderately dilated. - Tricuspid valve: There was moderate-severe regurgitation. - Pulmonic valve: There was mild regurgitation. - Pulmonary arteries: PA peak pressure: 74 mm Hg (S).  Transthoracic echocardiography.  M-mode, complete 2D, spectral Doppler, and color Doppler.  Birthdate:  Patient birthdate: 1928-07-02.  Age:  Patient is 81 yr old.  Sex:  Gender: female. BMI: 24.1 kg/m^2.  Blood pressure:     195/87  Patient status: Inpatient.  Study date:  Study date: 06/29/2015. Study time: 10:18 AM.  Location:  Bedside.  -------------------------------------------------------------------  ------------------------------------------------------------------- Left ventricle:  The cavity size was normal. Wall thickness was increased in a pattern of mild LVH. Systolic function was normal. The estimated ejection fraction was in the range of 55% to 60%. Wall motion was normal; there were no regional wall motion abnormalities. The study was not technically sufficient to allow evaluation of LV diastolic dysfunction due to atrial fibrillation. Doppler parameters are consistent with high ventricular  filling pressure.  ------------------------------------------------------------------- Aortic valve:   Moderately calcified annulus. Trileaflet; mildly thickened, mildly calcified leaflets.  Doppler:   There was mild to moderate stenosis.   There was mild regurgitation.    VTI ratio of LVOT to aortic valve: 0.29. Valve area (VTI): 0.73 cm^2. Indexed valve area (VTI): 0.44 cm^2/m^2. Peak velocity ratio of LVOT to aortic valve: 0.39. Valve area (Vmax): 1 cm^2. Indexed valve area (Vmax): 0.6 cm^2/m^2. Mean velocity ratio of LVOT to aortic valve: 0.32. Valve area (Vmean): 0.81 cm^2. Indexed valve area (Vmean): 0.49 cm^2/m^2.    Mean gradient (S): 13 mm Hg. Peak gradient (S): 22 mm Hg.  ------------------------------------------------------------------- Aorta:  Aortic root: The aortic root was normal in size.  ------------------------------------------------------------------- Mitral valve:   Normal thickness leaflets .  Doppler:  There was moderate regurgitation.  Peak gradient (D): 6 mm Hg.  ------------------------------------------------------------------- Left atrium:  The atrium was severely dilated.  ------------------------------------------------------------------- Atrial septum:  No defect or patent foramen ovale was identified.   ------------------------------------------------------------------- Right ventricle:  The cavity size was normal. Systolic function was mildly reduced.  ------------------------------------------------------------------- Pulmonic valve:    The valve appears to be grossly normal. Doppler:  There was mild regurgitation.  ------------------------------------------------------------------- Tricuspid valve:   Mildly thickened leaflets.  Doppler:  There was moderate-severe regurgitation.  ------------------------------------------------------------------- Right atrium:  The atrium was moderately  dilated.  ------------------------------------------------------------------- Pericardium:  There was no pericardial effusion.  ------------------------------------------------------------------- Systemic veins: Inferior vena cava: The vessel was normal in size. The respirophasic diameter changes were in the normal range (>= 50%), consistent with normal central venous pressure.           CLINICAL DATA:  Abnormal chest x-ray   EXAM: CT CHEST WITHOUT CONTRAST   TECHNIQUE: Multidetector CT imaging of the chest was performed following the standard protocol without IV contrast.   COMPARISON:  06/28/2015 x-ray of the chest   FINDINGS: Sagittal images of the spine shows osteopenia and degenerative changes thoracic spine.   There is dextroscoliosis and mild kyphosis of thoracic spine.   Extensive atherosclerotic calcifications of thoracic aorta. Atherosclerotic calcifications of coronary arteries.   Study is limited without IV contrast. A precarinal lymph node measures 9 mm short-axis. No significant hilar adenopathy.   Central airways are patent. There is cardiomegaly. Bilateral small pleural effusion. There is right base atelectasis. Left base atelectasis or infiltrate.   As noted on chest x-ray there is a nodular lesion in right lower lobe posteriorly. This is best visualized in sagittal image 20 measures 1.6 by 1.5 cm. Malignancy cannot be excluded. Further correlation with PET scan and/or biopsy is recommended. There is a nodule in superior segment of right lower lobe measures 8 mm. Second nodule in superior segment of right lower lobe measures 6 mm. A nodule in right upper lobe laterally just anterior to the fissure measures 7 mm. Metastatic disease cannot be excluded. There is a pleural-based nodule in left upper lobe posteriorly measures 8 mm.   There is no pulmonary edema. Atherosclerotic calcifications of abdominal aorta. No adrenal gland mass is noted in  visualized upper abdomen. Atherosclerotic calcifications of splenic artery.   IMPRESSION: 1. As noted on chest x-ray there is a nodular lesion in right lower lobe posteriorly. This is best seen in sagittal image 20 measures 1.6 by 1.5 cm. Malignancy cannot be excluded. Further correlation with PET scan and/or biopsy is recommended. 2. No mediastinal or hilar adenopathy. 3. Additional smaller nodules are noted bilaterally. Metastatic disease cannot be excluded. 4. Atherosclerotic calcifications of thoracic and abdominal aorta. Atherosclerotic calcifications of coronary arteries. 5. Degenerative changes thoracic spine. 6. Bilateral small pleural effusion. There is atelectasis in right lower lobe posteriorly. Atelectasis or infiltrate in left lower lobe posteriorly. 7. Cardiomegaly is noted.        Narrative                         *[facility] [address]                        [place], [place] [address]  [id number]  ------------------------------------------------------------------- Transthoracic Echocardiography  Patient:    Tracy [name], Tracy [name] MR #:       [id number] Study Date: 06/29/2015 Gender:     F Age:        81 Height:     160 cm Weight:     61.7 kg BSA:        1.67 m^2 Pt. Status: Room:       [id number]   Alger Simons, M.D.  REFERRING    Rozann Lesches, M.D.  ATTENDING    Mcinnis, Angus G  ADMITTING    Elgergawy, Dawood S  PERFORMING   Chmg, Forestine Na  SONOGRAPHER  [employer], RCS  cc:  ------------------------------------------------------------------- LV EF: 55% -   60%  ------------------------------------------------------------------- Indications:      Dyspnea 786.09.  ------------------------------------------------------------------- Study Conclusions  - Left ventricle: The cavity size was normal. Wall thickness was   increased in a pattern of mild LVH. Systolic  function was normal.   The estimated ejection fraction was in the range of 55% to 60%.   Wall motion was normal; there were no regional wall motion   abnormalities. The study was not technically sufficient to allow   evaluation of LV diastolic dysfunction due to atrial   fibrillation. Doppler parameters are consistent with high   ventricular filling pressure. - Aortic valve: Moderately calcified annulus. Trileaflet; mildly   thickened, mildly calcified leaflets. There was mild to moderate   stenosis. There was mild regurgitation. Peak velocity (S): 237   cm/s. Mean gradient (S): 13 mm Hg. Valve area (VTI): 0.73 cm^2.   Valve area (Vmax): 1 cm^2. Valve area (Vmean): 0.81 cm^2. - Mitral valve: There was moderate regurgitation. - Left atrium: The atrium was severely dilated. - Right ventricle: Systolic function was mildly reduced. - Right atrium: The atrium was moderately dilated. - Tricuspid valve: There was moderate-severe regurgitation. - Pulmonic valve: There was mild regurgitation. - Pulmonary arteries: PA peak pressure: 74 mm Hg (S).  Transthoracic echocardiography.  M-mode, complete 2D, spectral Doppler, and color Doppler.  Birthdate:  Patient birthdate: 06-Nov-1927.  Age:  Patient is 81 yr old.  Sex:  Gender: female. BMI: 24.1 kg/m^2.  Blood pressure:     195/87  Patient status: Inpatient.  Study date:  Study date: 06/29/2015. Study time: 10:18 AM.  Location:  Bedside.  -------------------------------------------------------------------  ------------------------------------------------------------------- Left ventricle:  The cavity size was normal. Wall thickness was increased in a pattern of mild LVH. Systolic function was normal. The estimated ejection fraction was in the range of 55% to 60%. Wall motion was normal; there were no regional wall motion abnormalities. The study was not technically sufficient to allow evaluation of LV diastolic dysfunction due to atrial  fibrillation. Doppler parameters are consistent with high ventricular filling pressure.  ------------------------------------------------------------------- Aortic valve:   Moderately calcified annulus. Trileaflet; mildly thickened, mildly calcified leaflets.  Doppler:   There was mild to moderate stenosis.   There was mild regurgitation.    VTI ratio of LVOT to aortic valve: 0.29. Valve area (VTI): 0.73 cm^2. Indexed valve area (VTI): 0.44 cm^2/m^2. Peak velocity ratio of LVOT to aortic valve: 0.39. Valve area (Vmax): 1 cm^2. Indexed valve area (Vmax): 0.6 cm^2/m^2. Mean velocity ratio of LVOT to aortic valve: 0.32. Valve area (Vmean): 0.81 cm^2. Indexed valve area (Vmean): 0.49 cm^2/m^2.    Mean gradient (S): 13 mm Hg. Peak gradient (S): 22 mm Hg.  ------------------------------------------------------------------- Aorta:  Aortic root: The aortic root was normal  in size.  ------------------------------------------------------------------- Mitral valve:   Normal thickness leaflets .  Doppler:  There was moderate regurgitation.    Peak gradient (D): 6 mm Hg.  ------------------------------------------------------------------- Left atrium:  The atrium was severely dilated.  ------------------------------------------------------------------- Atrial septum:  No defect or patent foramen ovale was identified.   ------------------------------------------------------------------- Right ventricle:  The cavity size was normal. Systolic function was mildly reduced.  ------------------------------------------------------------------- Pulmonic valve:    The valve appears to be grossly normal. Doppler:  There was mild regurgitation.  ------------------------------------------------------------------- Tricuspid valve:   Mildly thickened leaflets.  Doppler:  There was moderate-severe regurgitation.  ------------------------------------------------------------------- Right atrium:  The  atrium was moderately dilated.  ------------------------------------------------------------------- Pericardium:  There was no pericardial effusion.  ------------------------------------------------------------------- Systemic veins: Inferior vena cava: The vessel was normal in size. The respirophasic diameter changes were in the normal range (>= 50%), consistent with normal central venous pressure.  -------------------------------------------------------------------     Current medications diltiazem CD 120 daily Calcium carbonate 1500 mg once a day Catapres 0.2 twice a day Crestor 20 once a day Lasix; 40 mg once a day Ferrous sulfate 325 daily Metoprolol 25 twice a day  Review of systems Gen. Patient states she feels weak Respiratory short of breath Cardiac; no chest pain Abdomen; no abdominal pain or diarrhea GUno dysuria no hematuria  Physical examination Gen. As noted the patient's weight is 160.8 pounds about the best I can say about this is admitted stabl and down 1.8 pounds since 12/23 but this still is up quite a bit from when she arrived here Respiratory thoracic kyphosis air entry is shallow but clear Cardiac; to let us 6 systolic ejection murmur. Her JVP is elevated to the angle of the jaw at 45. Tall V waves is noted Abdomen; no liver no spleen no masses GU bladder is not obviously distended Extremities; bilateral 2-3+ edema extending up into her thighs although there appears to be less edema in her flanks and no coccyx edema is noted.  Impression/plan #1 hypokalemia I'm going to need to carefully replace this.Obviously she does not need Kayexalate every second dayalthough I suspect she may actually need this at a lesser frequency #2acute on chronic renal failure. This is not getting any better. She will need to see nephrology.I'm going to repeat her renal ultrasound to look at the left kidney and a bladder mass. this is likely ATN but the actual insult is  unclear.  #3 bladder mass, she may need to see urology as well #4probable right greater than left congestive heart failure, aortic stenosis. Likely needs an increase in her Lasix #5atrial fibrillation, heart rate is controlled  This situation continues to gradually deteriorate. I'm going to ask for a nephrology consults. We seemed to be holding our own barely.

## 2015-07-16 NOTE — Progress Notes (Addendum)
Patient ID: Tracy [name], female   DOB: 12-26-1927, 81 y.o.   MRN: [id number]                PROGRESS NOTE  DATE:  07/11/2015        FACILITY: [facility]                  LEVEL OF CARE:   SNF   Acute Visit/Routine Visit   CHIEF COMPLAINT:  Follow up acute on chronic renal failure.       HISTORY OF PRESENT ILLNESS:  This is a patient who was brought to the hospital with shortness of breath.  She was found to be in acute renal failure with a baseline creatinine in August of 1.38.  She was admitted with a creatinine of 3.5 and this generally increased into the high 3.8 range with a BUN of 71.  She was attempted to have diuresis due to disfiguring lower extremity edema.  However, her BUN and creatinine went up.  Therefore, she was not sent here on any diuretic.    A renal ultrasound showed a lesion in the posterior aspect of the urinary bladder measuring 5 x 3.9.  She had an unremarkable right kidney, mild left hydronephrosis.  No renal calculi.    CURRENT MEDICATIONS:  Medication list is reviewed.    I took her off the Norvasc and put her on Cardizem CD 120 q.d.     Os-Cal 600 daily.    Clonidine 0.2 b.i.d.     Ferrous sulfate 325 daily.    Lopressor 25 b.i.d.     Crestor 20 mg daily.    REVIEW OF SYSTEMS:    HEENT:   No visual complaints.     CHEST/RESPIRATORY:  The patient is on oxygen.  She was not on oxygen at home.  She is complaining of shortness of breath.         CARDIAC:  No chest pain.   GI:  No nausea, vomiting, or diarrhea.                GU:  No voiding difficulties.   MUSCULOSKELETAL:  Still complaining of right shoulder difficulties.   ENDOCRINE:  No diabetes history.      PHYSICAL EXAMINATION:   VITAL SIGNS:     PULSE:  64.   RESPIRATIONS:  20.    02 SATURATIONS:  99% on 3 L.   CHEST/RESPIRATORY:  She is kyphotic.  There is decreased air entry in the left lower lobe.  Right lung is shallow, but otherwise clearer.      CARDIOVASCULAR:     CARDIAC:  Midsystolic ejection murmur.   JVP is not visible at 90.   GASTROINTESTINAL:   LIVER/SPLEEN/KIDNEYS:  No liver, no spleen.  No tenderness.    CIRCULATION:   EDEMA/VARICOSITIES:  Extremities:  3+ edema bilaterally, extending up into her posterior thighs.  She has 3+ coccyx edema, as well.   I actually think her edema is somewhat better.       ASSESSMENT/PLAN:             Acute on chronic renal failure.  Once again, the etiology of this is not clear.  She has massive edema in her legs.  I had put her on 40 of Lasix.  This may have helped somewhat, although we are still in a markedly fluid-overloaded state.  She is going to need a follow-up with Nephrology.  She was seen by Dr. Lowanda Foster in  the hospital.    She has a history of bladder cancer, although she only had mild left-sided hydronephrosis.  She appears to have continued to have a bladder mass.    History of aortic stenosis.    In spite of the increase in her BUN and creatinine, I am going to continue the Lasix.  I will also wrap her legs.  Potassium seems to be going up.  I am going to put her on every-second-day Kayexalate.  Repeat her basic metabolic panel and CBC next Monday.

## 2015-07-18 ENCOUNTER — Ambulatory Visit (HOSPITAL_COMMUNITY)
Admission: RE | Admit: 2015-07-18 | Discharge: 2015-07-18 | Disposition: A | Discharge status: Home / Self Care | Payer: Medicare Other | Source: Ambulatory Visit | Attending: Internal Medicine | Admitting: Internal Medicine

## 2015-07-18 ENCOUNTER — Encounter (HOSPITAL_COMMUNITY)
Admission: AD | Admit: 2015-07-18 | Discharge: 2015-07-18 | Disposition: A | Discharge status: Home / Self Care | Payer: Medicare Other | Source: Skilled Nursing Facility | Attending: Internal Medicine | Admitting: Internal Medicine

## 2015-07-18 DIAGNOSIS — Z8551 Personal history of malignant neoplasm of bladder: Secondary | ICD-10-CM | POA: Insufficient documentation

## 2015-07-18 DIAGNOSIS — N329 Bladder disorder, unspecified: Secondary | ICD-10-CM | POA: Insufficient documentation

## 2015-07-18 DIAGNOSIS — N1330 Unspecified hydronephrosis: Secondary | ICD-10-CM | POA: Diagnosis present

## 2015-07-18 DIAGNOSIS — N261 Atrophy of kidney (terminal): Secondary | ICD-10-CM | POA: Insufficient documentation

## 2015-07-18 LAB — BASIC METABOLIC PANEL
Anion gap: 10 (ref 5–15)
BUN: 69 mg/dL — ABNORMAL HIGH (ref 6–20)
CALCIUM: 8.7 mg/dL — AB (ref 8.9–10.3)
CO2: 26 mmol/L (ref 22–32)
CREATININE: 3.86 mg/dL — AB (ref 0.44–1.00)
Chloride: 100 mmol/L — ABNORMAL LOW (ref 101–111)
GFR, EST AFRICAN AMERICAN: 11 mL/min — AB (ref >60)
GFR, EST NON AFRICAN AMERICAN: 10 mL/min — AB (ref >60)
Glucose, Bld: 110 mg/dL — ABNORMAL HIGH (ref 65–99)
Potassium: 4.1 mmol/L (ref 3.5–5.1)
SODIUM: 136 mmol/L (ref 135–145)

## 2015-07-20 ENCOUNTER — Encounter (HOSPITAL_COMMUNITY)
Admission: AD | Admit: 2015-07-20 | Discharge: 2015-07-20 | Disposition: A | Discharge status: Home / Self Care | Payer: Medicare Other | Source: Skilled Nursing Facility | Attending: Internal Medicine | Admitting: Internal Medicine

## 2015-07-20 ENCOUNTER — Non-Acute Institutional Stay (SKILLED_NURSING_FACILITY): Payer: Medicare Other | Admitting: Internal Medicine

## 2015-07-20 ENCOUNTER — Encounter: Payer: Self-pay | Admitting: Internal Medicine

## 2015-07-20 DIAGNOSIS — N183 Chronic kidney disease, stage 3 unspecified: Secondary | ICD-10-CM

## 2015-07-20 DIAGNOSIS — N189 Chronic kidney disease, unspecified: Secondary | ICD-10-CM

## 2015-07-20 DIAGNOSIS — E876 Hypokalemia: Secondary | ICD-10-CM

## 2015-07-20 DIAGNOSIS — I5031 Acute diastolic (congestive) heart failure: Secondary | ICD-10-CM | POA: Diagnosis not present

## 2015-07-20 LAB — BASIC METABOLIC PANEL
ANION GAP: 7 (ref 5–15)
BUN: 68 mg/dL — ABNORMAL HIGH (ref 6–20)
CALCIUM: 8.6 mg/dL — AB (ref 8.9–10.3)
CO2: 25 mmol/L (ref 22–32)
Chloride: 102 mmol/L (ref 101–111)
Creatinine, Ser: 3.64 mg/dL — ABNORMAL HIGH (ref 0.44–1.00)
GFR, EST AFRICAN AMERICAN: 12 mL/min — AB (ref >60)
GFR, EST NON AFRICAN AMERICAN: 10 mL/min — AB (ref >60)
GLUCOSE: 113 mg/dL — AB (ref 65–99)
POTASSIUM: 4.4 mmol/L (ref 3.5–5.1)
Sodium: 134 mmol/L — ABNORMAL LOW (ref 135–145)

## 2015-07-20 LAB — CBC
HEMATOCRIT: 25.9 % — AB (ref 36.0–46.0)
HEMOGLOBIN: 8.6 g/dL — AB (ref 12.0–15.0)
MCH: 27.8 pg (ref 26.0–34.0)
MCHC: 33.2 g/dL (ref 30.0–36.0)
MCV: 83.8 fL (ref 78.0–100.0)
Platelets: 131 10*3/uL — ABNORMAL LOW (ref 150–400)
RBC: 3.09 MIL/uL — AB (ref 3.87–5.11)
RDW: 23.1 % — ABNORMAL HIGH (ref 11.5–15.5)
WBC: 6.9 10*3/uL (ref 4.0–10.5)

## 2015-07-20 NOTE — Progress Notes (Signed)
Patient ID: Tracy [name], female   DOB: 1927/08/13, 81 y.o.   MRN: [id number]  Facility; [facility]  this is an acute visit  Chief complaint--acute visit follow-up chronic renal failure-CHF  HPI-; this is a patient who was admitted to hospital withacute on chronic renal failure and congestive heart failure in the face of moderate aortic stenosis and chronic atrial fibrillation. When she first arrived here she had anasarca extending up into her abdominal flanksa- \She came in apparently on 40 mg a day-Dr. Dellia Nims was concerned about some increased edema and increased it recently to 60 mg a day.  Her weight appears to be slowly increasing it was 160 early this week according the scales it is now 166-edema appears to be relatively baseline however.  She also had a high potassium of 5.5 she did receive Kayexalate and this brought the potassium down to 3.1-potassium was supplemented this has been discontinued secondary to normalization of her potassium is currently 4.4 as of today.  She does have a history of chronic kidney disease and a nephrology consult is pending-creatinine has been above 4 but it is actually slightly improved today at 3.64 with a BUN of 68.  Clinically she appears to be stable does not complain of any increased shortness of breath beyond baseline her edema appears to be relatively stable vital signs appear to be stable as well.  \ A renal ultrasound done while she was in the hospitalon 12/8 showed a unremarkable right kidney. There was very mild left hydronephrosis no left renal calculus There was a lesionfilling deficit within theleft aspect of the urinary bladder She has a known history of bladder cancer. He also has significant aortic stenosis Dr. Dellia Nims did order repeat ultrasound this week which actually showed mild improvement in the left hydronephrosis  Labs.  07/20/2015.  Sodium 134 potassium 4.4 BUN 68 creatinine 3.64.  WBC 6.9 hemoglobin 8.6 platelets 131.  BMP  Latest Ref Rng 07/16/2015 07/11/2015 07/09/2015  Glucose 65 - 99 mg/dL 106(H) 102(H) 103(H)  BUN 6 - 20 mg/dL 70(H) 80(H) 71(H)  Creatinine 0.44 - 1.00 mg/dL 4.14(H) 3.99(H) 3.85(H)  Sodium 135 - 145 mmol/L 138 135 137  Potassium 3.5 - 5.1 mmol/L 3.1(L) 5.5(H) 5.2(H)  Chloride 101 - 111 mmol/L 104 108 109  CO2 22 - 32 mmol/L 26 21(L) 21(L)  Calcium 8.9 - 10.3 mg/dL 8.6(L) 9.1 9.3   CBC Latest Ref Rng 07/16/2015 07/09/2015 07/03/2015  WBC 4.0 - 10.5 K/uL 6.5 5.9 4.9  Hemoglobin 12.0 - 15.0 g/dL 8.7(L) 9.2(L) 9.3(L)  Hematocrit 36.0 - 46.0 % 26.2(L) 28.0(L) 28.6(L)  Platelets 150 - 400 K/uL 131(L) 144(L) 136(L)         CLINICAL DATA:  Shortness of breath, bladder cancer   EXAM: CHEST  2 VIEW   COMPARISON:  CT chest dated 06/28/2015   FINDINGS: Small to moderate bilateral pleural effusions. Associated patchy bilateral lower lobe opacities, likely compressive atelectasis.   Known right lower lobe pulmonary nodule from recent chest radiograph/ CT is obscured.   No frank interstitial edema.  No pneumothorax.   The heart is top-normal in size/ mildly enlarged.   Degenerative changes of the visualized thoracolumbar spine.   IMPRESSION: Small to moderate bilateral pleural effusions, increased.   Associated patchy bilateral lower lobe opacities, likely compressive atelectasis.   No frank interstitial edema.                           *[facility]  [address] [place], [place] [address]                            [id number]  ------------------------------------------------------------------- Transthoracic Echocardiography  Patient:    Tracy [name], Tracy [name] MR #:       [id number] Study Date: 06/29/2015 Gender:     F Age:        81 Height:     160 cm Weight:     61.7 kg BSA:        1.67 m^2 Pt. Status: Room:       [id number]   Alger Simons, M.D.  REFERRING    Rozann Lesches, M.D.  ATTENDING     Mcinnis, Angus G  ADMITTING    Elgergawy, Dawood S  PERFORMING   [facility], Forestine Na  SONOGRAPHER  [employer], RCS  cc:  ------------------------------------------------------------------- LV EF: 55% -   60%  ------------------------------------------------------------------- Indications:      Dyspnea 786.09.  ------------------------------------------------------------------- Study Conclusions  - Left ventricle: The cavity size was normal. Wall thickness was   increased in a pattern of mild LVH. Systolic function was normal.   The estimated ejection fraction was in the range of 55% to 60%.   Wall motion was normal; there were no regional wall motion   abnormalities. The study was not technically sufficient to allow   evaluation of LV diastolic dysfunction due to atrial   fibrillation. Doppler parameters are consistent with high   ventricular filling pressure. - Aortic valve: Moderately calcified annulus. Trileaflet; mildly   thickened, mildly calcified leaflets. There was mild to moderate   stenosis. There was mild regurgitation. Peak velocity (S): 237   cm/s. Mean gradient (S): 13 mm Hg. Valve area (VTI): 0.73 cm^2.   Valve area (Vmax): 1 cm^2. Valve area (Vmean): 0.81 cm^2. - Mitral valve: There was moderate regurgitation. - Left atrium: The atrium was severely dilated. - Right ventricle: Systolic function was mildly reduced. - Right atrium: The atrium was moderately dilated. - Tricuspid valve: There was moderate-severe regurgitation. - Pulmonic valve: There was mild regurgitation. - Pulmonary arteries: PA peak pressure: 74 mm Hg (S).  Transthoracic echocardiography.  M-mode, complete 2D, spectral Doppler, and color Doppler.  Birthdate:  Patient birthdate: [birth year].  Age:  Patient is 81 yr old.  Sex:  Gender: female. BMI: 24.1 kg/m^2.  Blood pressure:     195/87  Patient status: Inpatient.  Study date:  Study date: 06/29/2015. Study time: 10:18 AM.  Location:   Bedside.  -------------------------------------------------------------------  ------------------------------------------------------------------- Left ventricle:  The cavity size was normal. Wall thickness was increased in a pattern of mild LVH. Systolic function was normal. The estimated ejection fraction was in the range of 55% to 60%. Wall motion was normal; there were no regional wall motion abnormalities. The study was not technically sufficient to allow evaluation of LV diastolic dysfunction due to atrial fibrillation. Doppler parameters are consistent with high ventricular filling pressure.  ------------------------------------------------------------------- Aortic valve:   Moderately calcified annulus. Trileaflet; mildly thickened, mildly calcified leaflets.  Doppler:   There was mild to moderate stenosis.   There was mild regurgitation.    VTI  ratio of LVOT to aortic valve: 0.29. Valve area (VTI): 0.73 cm^2. Indexed valve area (VTI): 0.44 cm^2/m^2. Peak velocity ratio of LVOT to aortic valve: 0.39. Valve area (Vmax): 1 cm^2. Indexed valve area (Vmax): 0.6 cm^2/m^2. Mean velocity ratio of LVOT to aortic valve: 0.32. Valve area (Vmean): 0.81 cm^2. Indexed valve area (Vmean): 0.49 cm^2/m^2.    Mean gradient (S): 13 mm Hg. Peak gradient (S): 22 mm Hg.  ------------------------------------------------------------------- Aorta:  Aortic root: The aortic root was normal in size.  ------------------------------------------------------------------- Mitral valve:   Normal thickness leaflets .  Doppler:  There was moderate regurgitation.    Peak gradient (D): 6 mm Hg.  ------------------------------------------------------------------- Left atrium:  The atrium was severely dilated.  ------------------------------------------------------------------- Atrial septum:  No defect or patent foramen ovale was identified.    ------------------------------------------------------------------- Right ventricle:  The cavity size was normal. Systolic function was mildly reduced.  ------------------------------------------------------------------- Pulmonic valve:    The valve appears to be grossly normal. Doppler:  There was mild regurgitation.  ------------------------------------------------------------------- Tricuspid valve:   Mildly thickened leaflets.  Doppler:  There was moderate-severe regurgitation.  ------------------------------------------------------------------- Right atrium:  The atrium was moderately dilated.  ------------------------------------------------------------------- Pericardium:  There was no pericardial effusion.  ------------------------------------------------------------------- Systemic veins: Inferior vena cava: The vessel was normal in size. The respirophasic diameter changes were in the normal range (>= 50%), consistent with normal central venous pressure.           CLINICAL DATA:  Abnormal chest x-ray   EXAM: CT CHEST WITHOUT CONTRAST   TECHNIQUE: Multidetector CT imaging of the chest was performed following the standard protocol without IV contrast.   COMPARISON:  06/28/2015 x-ray of the chest   FINDINGS: Sagittal images of the spine shows osteopenia and degenerative changes thoracic spine.   There is dextroscoliosis and mild kyphosis of thoracic spine.   Extensive atherosclerotic calcifications of thoracic aorta. Atherosclerotic calcifications of coronary arteries.   Study is limited without IV contrast. A precarinal lymph node measures 9 mm short-axis. No significant hilar adenopathy.   Central airways are patent. There is cardiomegaly. Bilateral small pleural effusion. There is right base atelectasis. Left base atelectasis or infiltrate.   As noted on chest x-ray there is a nodular lesion in right lower lobe posteriorly. This is best visualized in  sagittal image 20 measures 1.6 by 1.5 cm. Malignancy cannot be excluded. Further correlation with PET scan and/or biopsy is recommended. There is a nodule in superior segment of right lower lobe measures 8 mm. Second nodule in superior segment of right lower lobe measures 6 mm. A nodule in right upper lobe laterally just anterior to the fissure measures 7 mm. Metastatic disease cannot be excluded. There is a pleural-based nodule in left upper lobe posteriorly measures 8 mm.   There is no pulmonary edema. Atherosclerotic calcifications of abdominal aorta. No adrenal gland mass is noted in visualized upper abdomen. Atherosclerotic calcifications of splenic artery.   IMPRESSION: 1. As noted on chest x-ray there is a nodular lesion in right lower lobe posteriorly. This is best seen in sagittal image 20 measures 1.6 by 1.5 cm. Malignancy cannot be excluded. Further correlation with PET scan and/or biopsy is recommended. 2. No mediastinal or hilar adenopathy. 3. Additional smaller nodules are noted bilaterally. Metastatic disease cannot be excluded. 4. Atherosclerotic calcifications of thoracic and abdominal aorta. Atherosclerotic calcifications of coronary arteries. 5. Degenerative changes thoracic spine. 6. Bilateral small pleural effusion. There is atelectasis in right lower lobe posteriorly. Atelectasis or infiltrate in left lower lobe  posteriorly. 7. Cardiomegaly is noted.        Narrative                         *[place], [place] [address]                            [id number]  ------------------------------------------------------------------- Transthoracic Echocardiography  Patient:    Tracy [name], Tracy [name] MR #:       [id number] Study Date: 06/29/2015 Gender:     F Age:        42 Height:     160 cm Weight:     61.7 kg BSA:        1.67 m^2 Pt. Status: Room:       [id number]   Alger Simons, M.D.  REFERRING    Rozann Lesches, M.D.  ATTENDING    Mcinnis, Angus G  ADMITTING    Elgergawy, Dawood S  PERFORMING   [facility], Forestine Na  SONOGRAPHER  [employer], RCS  cc:  ------------------------------------------------------------------- LV EF: 55% -   60%  ------------------------------------------------------------------- Indications:      Dyspnea 786.09.  ------------------------------------------------------------------- Study Conclusions  - Left ventricle: The cavity size was normal. Wall thickness was   increased in a pattern of mild LVH. Systolic function was normal.   The estimated ejection fraction was in the range of 55% to 60%.   Wall motion was normal; there were no regional wall motion   abnormalities. The study was not technically sufficient to allow   evaluation of LV diastolic dysfunction due to atrial   fibrillation. Doppler parameters are consistent with high   ventricular filling pressure. - Aortic valve: Moderately calcified annulus. Trileaflet; mildly   thickened, mildly calcified leaflets. There was mild to moderate   stenosis. There was mild regurgitation. Peak velocity (S): 237   cm/s. Mean gradient (S): 13 mm Hg. Valve area (VTI): 0.73 cm^2.   Valve area (Vmax): 1 cm^2. Valve area (Vmean): 0.81 cm^2. - Mitral valve: There was moderate regurgitation. - Left atrium: The atrium was severely dilated. - Right ventricle: Systolic function was mildly reduced. - Right atrium: The atrium was moderately dilated. - Tricuspid valve: There was moderate-severe regurgitation. - Pulmonic valve: There was mild regurgitation. - Pulmonary arteries: PA peak pressure: 74 mm Hg (S).  Transthoracic echocardiography.  M-mode, complete 2D, spectral Doppler, and color Doppler.  Birthdate:  Patient birthdate: [birth year].  Age:  Patient is 81 yr old.  Sex:  Gender: female. BMI: 24.1 kg/m^2.  Blood pressure:     195/87  Patient status: Inpatient.   Study date:  Study date: 06/29/2015. Study time: 10:18 AM.  Location:  Bedside.  -------------------------------------------------------------------  ------------------------------------------------------------------- Left ventricle:  The cavity size was normal. Wall thickness was increased in a pattern of mild LVH. Systolic function was normal. The estimated ejection fraction was in the range of 55% to 60%. Wall motion was normal; there were no regional wall motion abnormalities. The study was not technically sufficient to allow evaluation of LV diastolic dysfunction due to atrial fibrillation. Doppler parameters are consistent with  high ventricular filling pressure.  ------------------------------------------------------------------- Aortic valve:   Moderately calcified annulus. Trileaflet; mildly thickened, mildly calcified leaflets.  Doppler:   There was mild to moderate stenosis.   There was mild regurgitation.    VTI ratio of LVOT to aortic valve: 0.29. Valve area (VTI): 0.73 cm^2. Indexed valve area (VTI): 0.44 cm^2/m^2. Peak velocity ratio of LVOT to aortic valve: 0.39. Valve area (Vmax): 1 cm^2. Indexed valve area (Vmax): 0.6 cm^2/m^2. Mean velocity ratio of LVOT to aortic valve: 0.32. Valve area (Vmean): 0.81 cm^2. Indexed valve area (Vmean): 0.49 cm^2/m^2.    Mean gradient (S): 13 mm Hg. Peak gradient (S): 22 mm Hg.  ------------------------------------------------------------------- Aorta:  Aortic root: The aortic root was normal in size.  ------------------------------------------------------------------- Mitral valve:   Normal thickness leaflets .  Doppler:  There was moderate regurgitation.    Peak gradient (D): 6 mm Hg.  ------------------------------------------------------------------- Left atrium:  The atrium was severely dilated.  ------------------------------------------------------------------- Atrial septum:  No defect or patent foramen ovale was  identified.   ------------------------------------------------------------------- Right ventricle:  The cavity size was normal. Systolic function was mildly reduced.  ------------------------------------------------------------------- Pulmonic valve:    The valve appears to be grossly normal. Doppler:  There was mild regurgitation.  ------------------------------------------------------------------- Tricuspid valve:   Mildly thickened leaflets.  Doppler:  There was moderate-severe regurgitation.  ------------------------------------------------------------------- Right atrium:  The atrium was moderately dilated.  ------------------------------------------------------------------- Pericardium:  There was no pericardial effusion.  ------------------------------------------------------------------- Systemic veins: Inferior vena cava: The vessel was normal in size. The respirophasic diameter changes were in the normal range (>= 50%), consistent with normal central venous pressure.  -------------------------------------------------------------------     Current medications diltiazem CD 120 daily Calcium carbonate 1500 mg once a day Catapres 0.2 twice a day Crestor 20 once a day Lasix; 60 mg once a day Ferrous sulfate 325 daily Metoprolol 25 twice a day  Review of systems Gen. Patient states she feels weak Respiratory does not complain of increased shortness of breath from baseline Cardiac; no chest pain Abdomen; no abdominal pain or diarrhea GUno dysuria no hematuria Muscle skeletal other than weakness is not complaining of joint pain. Neuro- is not complaining of dizziness headache or syncopal-type feelings  Physical examination Temperature 97.4 pulse 65 respirations 20 blood pressure 142/87 weight is 166.7 Gen. Elderly female in no acute distress Her skin is warm and dry  Respiratory thoracic kyphosis air entry is shallow but clear Cardiac; regular irregular rate  and rhythm with a 2/6 systolic murmur-she continues to have I would say2- 3+ edema up to her thighs-however her coccyx edema is not very remarkable Abdomen; soft nontender with positive bowel sounds Extremities;  Moves extremities 4 this appears to be baseline again does have fairly significant edema lower extremities. Neurologic appears grossly intact speech is clear.  Psych she is alert and oriented pleasant and appropriate-in fact she recognized me from my visits at another facility where she had a family member  Impression/plan #1 hypokalemia --this appears resolved status post supplementation potassium 4.4 today Will update this first laboratory day next week-again this has varied with episodes of hyperkalemia as well as  #2acute on chronic renal failure.  This continues to be a significant challenge-nephrology consult is pending. Updated renal ultrasound shows mild improvement in the left hydronephrosis as well as the bladder mass apparently at baseline--she will also need a urology consult.--Creatinine show slight improvement today Will update this first laboratory day next week  #3 bladder mass,as  Noted above #4probable right greater than left  congestive heart failure, aortic stenosis. Dr. Dellia Nims has just recently increased her Lasix again this is a challenging situation with her renal issues at this point continue to monitor weights closely and notify provider if continued weight gain-clinically she appears to be at baseline with no increase shortness of breath from baseline edema appears to be relatively unchanged as well #5atrial fibrillation, heart rate is controlled  [id number]   .

## 2015-07-22 DEATH — deceased

## 2015-07-23 ENCOUNTER — Encounter (HOSPITAL_COMMUNITY)
Admission: RE | Admit: 2015-07-23 | Discharge: 2015-07-23 | Disposition: A | Discharge status: Home / Self Care | Payer: Medicare Other | Source: Skilled Nursing Facility | Attending: Internal Medicine | Admitting: Internal Medicine

## 2015-07-23 LAB — BASIC METABOLIC PANEL
ANION GAP: 9 (ref 5–15)
BUN: 70 mg/dL — AB (ref 6–20)
CHLORIDE: 101 mmol/L (ref 101–111)
CO2: 25 mmol/L (ref 22–32)
Calcium: 8.7 mg/dL — ABNORMAL LOW (ref 8.9–10.3)
Creatinine, Ser: 3.82 mg/dL — ABNORMAL HIGH (ref 0.44–1.00)
GFR calc Af Amer: 11 mL/min — ABNORMAL LOW (ref >60)
GFR, EST NON AFRICAN AMERICAN: 10 mL/min — AB (ref >60)
GLUCOSE: 102 mg/dL — AB (ref 65–99)
POTASSIUM: 4.6 mmol/L (ref 3.5–5.1)
Sodium: 135 mmol/L (ref 135–145)

## 2015-07-27 ENCOUNTER — Non-Acute Institutional Stay (SKILLED_NURSING_FACILITY): Payer: Medicare Other | Admitting: Internal Medicine

## 2015-07-27 ENCOUNTER — Other Ambulatory Visit (HOSPITAL_COMMUNITY)
Admission: RE | Admit: 2015-07-27 | Discharge: 2015-07-27 | Disposition: A | Discharge status: Home / Self Care | Payer: Medicare Other | Source: Skilled Nursing Facility | Attending: Internal Medicine | Admitting: Internal Medicine

## 2015-07-27 ENCOUNTER — Ambulatory Visit (HOSPITAL_COMMUNITY)
Admission: RE | Admit: 2015-07-27 | Discharge: 2015-07-27 | Disposition: A | Discharge status: Home / Self Care | Payer: Medicare Other | Source: Skilled Nursing Facility | Attending: Internal Medicine | Admitting: Internal Medicine

## 2015-07-27 DIAGNOSIS — Z7189 Other specified counseling: Secondary | ICD-10-CM | POA: Diagnosis not present

## 2015-07-27 DIAGNOSIS — R0602 Shortness of breath: Secondary | ICD-10-CM | POA: Insufficient documentation

## 2015-07-27 DIAGNOSIS — N3289 Other specified disorders of bladder: Secondary | ICD-10-CM | POA: Diagnosis not present

## 2015-07-27 DIAGNOSIS — N183 Chronic kidney disease, stage 3 unspecified: Secondary | ICD-10-CM

## 2015-07-27 DIAGNOSIS — J90 Pleural effusion, not elsewhere classified: Secondary | ICD-10-CM | POA: Insufficient documentation

## 2015-07-27 DIAGNOSIS — N189 Chronic kidney disease, unspecified: Secondary | ICD-10-CM | POA: Diagnosis not present

## 2015-07-27 DIAGNOSIS — I5032 Chronic diastolic (congestive) heart failure: Secondary | ICD-10-CM | POA: Diagnosis not present

## 2015-07-27 DIAGNOSIS — R0989 Other specified symptoms and signs involving the circulatory and respiratory systems: Secondary | ICD-10-CM | POA: Insufficient documentation

## 2015-07-27 DIAGNOSIS — R05 Cough: Secondary | ICD-10-CM | POA: Insufficient documentation

## 2015-07-27 LAB — CBC WITH DIFFERENTIAL/PLATELET
BASOS ABS: 0 10*3/uL (ref 0.0–0.1)
BASOS PCT: 0 %
EOS ABS: 0 10*3/uL (ref 0.0–0.7)
Eosinophils Relative: 0 %
HCT: 25.3 % — ABNORMAL LOW (ref 36.0–46.0)
Hemoglobin: 8.3 g/dL — ABNORMAL LOW (ref 12.0–15.0)
Lymphocytes Relative: 5 %
Lymphs Abs: 0.4 10*3/uL — ABNORMAL LOW (ref 0.7–4.0)
MCH: 28 pg (ref 26.0–34.0)
MCHC: 32.8 g/dL (ref 30.0–36.0)
MCV: 85.5 fL (ref 78.0–100.0)
MONO ABS: 0.5 10*3/uL (ref 0.1–1.0)
MONOS PCT: 6 %
Neutro Abs: 7.4 10*3/uL (ref 1.7–7.7)
Neutrophils Relative %: 89 %
PLATELETS: 142 10*3/uL — AB (ref 150–400)
RBC: 2.96 MIL/uL — AB (ref 3.87–5.11)
RDW: 24.4 % — AB (ref 11.5–15.5)
WBC: 8.3 10*3/uL (ref 4.0–10.5)

## 2015-07-27 LAB — BASIC METABOLIC PANEL
Anion gap: 9 (ref 5–15)
BUN: 69 mg/dL — ABNORMAL HIGH (ref 6–20)
CALCIUM: 9 mg/dL (ref 8.9–10.3)
CO2: 25 mmol/L (ref 22–32)
CREATININE: 3.96 mg/dL — AB (ref 0.44–1.00)
Chloride: 99 mmol/L — ABNORMAL LOW (ref 101–111)
GFR calc Af Amer: 11 mL/min — ABNORMAL LOW (ref >60)
GFR calc non Af Amer: 9 mL/min — ABNORMAL LOW (ref >60)
GLUCOSE: 151 mg/dL — AB (ref 65–99)
Potassium: 4.9 mmol/L (ref 3.5–5.1)
Sodium: 133 mmol/L — ABNORMAL LOW (ref 135–145)

## 2015-07-27 NOTE — Progress Notes (Signed)
Patient ID: Tracy [name], female   DOB: 09-28-1927, 82 y.o.   MRN: [id number]   Facility; [facility]  this is an acute visit  Chief complaint--acute visit secondary to patient requesting essentially comfort care  HPI-; this is a patient who was admitted to hospital withacute on chronic renal failure and congestive heart failure in the face of moderate aortic stenosis and chronic atrial fibrillation. When she first arrived here she had anasarca extending up into her abdominal flanksa- \She came in apparently on 40 mg a day-Dr. Dellia Nims was concerned about some increased edema and increased it recently to 60 mg a day.  Her weight appears to be slowly increasing up from 155 on December 17 2 now 171.4.      She also had a high potassium of 5.5 she did receive Kayexalate and this brought the potassium down to 3.1-potassium was supplemented this has been discontinued secondary to normalization of her potassium--most recently 4.6 on January 2.  She does have a history of chronic kidney disease and a nephrology consult is pending-creatinine has been above 4 most recently on January second was 3.82.    \ A renal ultrasound done while she was in the hospitalon 12/8 showed a unremarkable right kidney. There was very mild left hydronephrosis no left renal calculus There was a lesionfilling deficit within theleft aspect of the urinary bladder She has a known history of bladder cancer. He also has significant aortic stenosis Dr. Dellia Nims did order repeat ultrasound last week which actually showed mild improvement in the left hydronephrosis  Patient also does have a bladder mass which is followed by urology she is not thought to be a good chemotherapy or radiation therapy candidate.  Patient has recently spoken to her family as well as nursing that she just wants to go want to go on to heaven--she says she is tired of living like this-it has been a long struggle-and she just wants to be essentially kept  comfortable with no further doctor visits to nephrology or urology or hospitalization  I did have an extensive discussion with her at bedside along with her family including her daughter.  She was quite adamant that she does not want any more aggressive measures and essentially wants to become comfortable-she is comfortable with getting a hospice consult.    Labs.  07/22/2014.  Sodium 135 potassium 4.6 BUN 70 creatinine 3.82  07/20/2015.  Sodium 134 potassium 4.4 BUN 68 creatinine 3.64.  WBC 6.9 hemoglobin 8.6 platelets 131.  BMP Latest Ref Rng 07/16/2015 07/11/2015 07/09/2015  Glucose 65 - 99 mg/dL 106(H) 102(H) 103(H)  BUN 6 - 20 mg/dL 70(H) 80(H) 71(H)  Creatinine 0.44 - 1.00 mg/dL 4.14(H) 3.99(H) 3.85(H)  Sodium 135 - 145 mmol/L 138 135 137  Potassium 3.5 - 5.1 mmol/L 3.1(L) 5.5(H) 5.2(H)  Chloride 101 - 111 mmol/L 104 108 109  CO2 22 - 32 mmol/L 26 21(L) 21(L)  Calcium 8.9 - 10.3 mg/dL 8.6(L) 9.1 9.3   CBC Latest Ref Rng 07/16/2015 07/09/2015 07/03/2015  WBC 4.0 - 10.5 K/uL 6.5 5.9 4.9  Hemoglobin 12.0 - 15.0 g/dL 8.7(L) 9.2(L) 9.3(L)  Hematocrit 36.0 - 46.0 % 26.2(L) 28.0(L) 28.6(L)  Platelets 150 - 400 K/uL 131(L) 144(L) 136(L)         CLINICAL DATA:  Shortness of breath, bladder cancer   EXAM: CHEST  2 VIEW   COMPARISON:  CT chest dated 06/28/2015   FINDINGS: Small to moderate bilateral pleural effusions. Associated patchy bilateral lower lobe opacities, likely  compressive atelectasis.   Known right lower lobe pulmonary nodule from recent chest radiograph/ CT is obscured.   No frank interstitial edema.  No pneumothorax.   The heart is top-normal in size/ mildly enlarged.   Degenerative changes of the visualized thoracolumbar spine.   IMPRESSION: Small to moderate bilateral pleural effusions, increased.   Associated patchy bilateral lower lobe opacities, likely compressive atelectasis.   No frank interstitial edema.                            [place], [place] [address]                            [id number]  ------------------------------------------------------------------- Transthoracic Echocardiography  Patient:    Tracy [name], Tracy [name] MR #:       [id number] Study Date: 06/29/2015 Gender:     F Age:        16 Height:     160 cm Weight:     61.7 kg BSA:        1.67 m^2 Pt. Status: Room:       [id number]   Alger Simons, M.D.  REFERRING    Rozann Lesches, M.D.  ATTENDING    Mcinnis, Angus G  ADMITTING    Elgergawy, Dawood S  PERFORMING   Chmg, Forestine Na  SONOGRAPHER  [employer], RCS  cc:  ------------------------------------------------------------------- LV EF: 55% -   60%  ------------------------------------------------------------------- Indications:      Dyspnea 786.09.  ------------------------------------------------------------------- Study Conclusions  - Left ventricle: The cavity size was normal. Wall thickness was   increased in a pattern of mild LVH. Systolic function was normal.   The estimated ejection fraction was in the range of 55% to 60%.   Wall motion was normal; there were no regional wall motion   abnormalities. The study was not technically sufficient to allow   evaluation of LV diastolic dysfunction due to atrial   fibrillation. Doppler parameters are consistent with high   ventricular filling pressure. - Aortic valve: Moderately calcified annulus. Trileaflet; mildly   thickened, mildly calcified leaflets. There was mild to moderate   stenosis. There was mild regurgitation. Peak velocity (S): 237   cm/s. Mean gradient (S): 13 mm Hg. Valve area (VTI): 0.73 cm^2.   Valve area (Vmax): 1 cm^2. Valve area (Vmean): 0.81 cm^2. - Mitral valve: There was moderate regurgitation. - Left atrium: The atrium was severely dilated. - Right ventricle: Systolic function was mildly reduced. - Right  atrium: The atrium was moderately dilated. - Tricuspid valve: There was moderate-severe regurgitation. - Pulmonic valve: There was mild regurgitation. - Pulmonary arteries: PA peak pressure: 74 mm Hg (S).  Transthoracic echocardiography.  M-mode, complete 2D, spectral Doppler, and color Doppler.  Birthdate:  Patient birthdate: 06/07/1928.  Age:  Patient is 82 yr old.  Sex:  Gender: female. BMI: 24.1 kg/m^2.  Blood pressure:     195/87  Patient status: Inpatient.  Study date:  Study date: 06/29/2015. Study time: 10:18 AM.  Location:  Bedside.  -------------------------------------------------------------------  ------------------------------------------------------------------- Left ventricle:  The cavity size  was normal. Wall thickness was increased in a pattern of mild LVH. Systolic function was normal. The estimated ejection fraction was in the range of 55% to 60%. Wall motion was normal; there were no regional wall motion abnormalities. The study was not technically sufficient to allow evaluation of LV diastolic dysfunction due to atrial fibrillation. Doppler parameters are consistent with high ventricular filling pressure.  ------------------------------------------------------------------- Aortic valve:   Moderately calcified annulus. Trileaflet; mildly thickened, mildly calcified leaflets.  Doppler:   There was mild to moderate stenosis.   There was mild regurgitation.    VTI ratio of LVOT to aortic valve: 0.29. Valve area (VTI): 0.73 cm^2. Indexed valve area (VTI): 0.44 cm^2/m^2. Peak velocity ratio of LVOT to aortic valve: 0.39. Valve area (Vmax): 1 cm^2. Indexed valve area (Vmax): 0.6 cm^2/m^2. Mean velocity ratio of LVOT to aortic valve: 0.32. Valve area (Vmean): 0.81 cm^2. Indexed valve area (Vmean): 0.49 cm^2/m^2.    Mean gradient (S): 13 mm Hg. Peak gradient (S): 22 mm Hg.  ------------------------------------------------------------------- Aorta:  Aortic root: The  aortic root was normal in size.  ------------------------------------------------------------------- Mitral valve:   Normal thickness leaflets .  Doppler:  There was moderate regurgitation.    Peak gradient (D): 6 mm Hg.  ------------------------------------------------------------------- Left atrium:  The atrium was severely dilated.  ------------------------------------------------------------------- Atrial septum:  No defect or patent foramen ovale was identified.   ------------------------------------------------------------------- Right ventricle:  The cavity size was normal. Systolic function was mildly reduced.  ------------------------------------------------------------------- Pulmonic valve:    The valve appears to be grossly normal. Doppler:  There was mild regurgitation.  ------------------------------------------------------------------- Tricuspid valve:   Mildly thickened leaflets.  Doppler:  There was moderate-severe regurgitation.  ------------------------------------------------------------------- Right atrium:  The atrium was moderately dilated.  ------------------------------------------------------------------- Pericardium:  There was no pericardial effusion.  ------------------------------------------------------------------- Systemic veins: Inferior vena cava: The vessel was normal in size. The respirophasic diameter changes were in the normal range (>= 50%), consistent with normal central venous pressure.           CLINICAL DATA:  Abnormal chest x-ray   EXAM: CT CHEST WITHOUT CONTRAST   TECHNIQUE: Multidetector CT imaging of the chest was performed following the standard protocol without IV contrast.   COMPARISON:  06/28/2015 x-ray of the chest   FINDINGS: Sagittal images of the spine shows osteopenia and degenerative changes thoracic spine.   There is dextroscoliosis and mild kyphosis of thoracic spine.   Extensive atherosclerotic  calcifications of thoracic aorta. Atherosclerotic calcifications of coronary arteries.   Study is limited without IV contrast. A precarinal lymph node measures 9 mm short-axis. No significant hilar adenopathy.   Central airways are patent. There is cardiomegaly. Bilateral small pleural effusion. There is right base atelectasis. Left base atelectasis or infiltrate.   As noted on chest x-ray there is a nodular lesion in right lower lobe posteriorly. This is best visualized in sagittal image 20 measures 1.6 by 1.5 cm. Malignancy cannot be excluded. Further correlation with PET scan and/or biopsy is recommended. There is a nodule in superior segment of right lower lobe measures 8 mm. Second nodule in superior segment of right lower lobe measures 6 mm. A nodule in right upper lobe laterally just anterior to the fissure measures 7 mm. Metastatic disease cannot be excluded. There is a pleural-based nodule in left upper lobe posteriorly measures 8 mm.   There is no pulmonary edema. Atherosclerotic calcifications of abdominal aorta. No adrenal gland mass is noted in visualized upper abdomen. Atherosclerotic calcifications of splenic artery.  IMPRESSION: 1. As noted on chest x-ray there is a nodular lesion in right lower lobe posteriorly. This is best seen in sagittal image 20 measures 1.6 by 1.5 cm. Malignancy cannot be excluded. Further correlation with PET scan and/or biopsy is recommended. 2. No mediastinal or hilar adenopathy. 3. Additional smaller nodules are noted bilaterally. Metastatic disease cannot be excluded. 4. Atherosclerotic calcifications of thoracic and abdominal aorta. Atherosclerotic calcifications of coronary arteries. 5. Degenerative changes thoracic spine. 6. Bilateral small pleural effusion. There is atelectasis in right lower lobe posteriorly. Atelectasis or infiltrate in left lower lobe posteriorly. 7. Cardiomegaly is noted.        Narrative                          *[place], [place] [address]                            [id number]  ------------------------------------------------------------------- Transthoracic Echocardiography  Patient:    Tracy [name], Tracy [name] MR #:       [id number] Study Date: 06/29/2015 Gender:     F Age:        28 Height:     160 cm Weight:     61.7 kg BSA:        1.67 m^2 Pt. Status: Room:       [id number]   Alger Simons, M.D.  REFERRING    Rozann Lesches, M.D.  ATTENDING    Mcinnis, Angus G  ADMITTING    Elgergawy, Dawood S  PERFORMING   Chmg, Forestine Na  SONOGRAPHER  [employer], RCS  cc:  ------------------------------------------------------------------- LV EF: 55% -   60%  ------------------------------------------------------------------- Indications:      Dyspnea 786.09.  ------------------------------------------------------------------- Study Conclusions  - Left ventricle: The cavity size was normal. Wall thickness was   increased in a pattern of mild LVH. Systolic function was normal.   The estimated ejection fraction was in the range of 55% to 60%.   Wall motion was normal; there were no regional wall motion   abnormalities. The study was not technically sufficient to allow   evaluation of LV diastolic dysfunction due to atrial   fibrillation. Doppler parameters are consistent with high   ventricular filling pressure. - Aortic valve: Moderately calcified annulus. Trileaflet; mildly   thickened, mildly calcified leaflets. There was mild to moderate   stenosis. There was mild regurgitation. Peak velocity (S): 237   cm/s. Mean gradient (S): 13 mm Hg. Valve area (VTI): 0.73 cm^2.   Valve area (Vmax): 1 cm^2. Valve area (Vmean): 0.81 cm^2. - Mitral valve: There was moderate regurgitation. - Left atrium: The atrium was severely dilated. - Right ventricle: Systolic function was mildly  reduced. - Right atrium: The atrium was moderately dilated. - Tricuspid valve: There was moderate-severe regurgitation. - Pulmonic valve: There was mild regurgitation. - Pulmonary arteries: PA peak pressure: 74 mm Hg (S).  Transthoracic echocardiography.  M-mode, complete 2D, spectral Doppler, and color Doppler.  Birthdate:  Patient birthdate: 06/01/1928.  Age:  Patient is 82 yr old.  Sex:  Gender: female. BMI: 24.1  kg/m^2.  Blood pressure:     195/87  Patient status: Inpatient.  Study date:  Study date: 06/29/2015. Study time: 10:18 AM.  Location:  Bedside.  -------------------------------------------------------------------  ------------------------------------------------------------------- Left ventricle:  The cavity size was normal. Wall thickness was increased in a pattern of mild LVH. Systolic function was normal. The estimated ejection fraction was in the range of 55% to 60%. Wall motion was normal; there were no regional wall motion abnormalities. The study was not technically sufficient to allow evaluation of LV diastolic dysfunction due to atrial fibrillation. Doppler parameters are consistent with high ventricular filling pressure.  ------------------------------------------------------------------- Aortic valve:   Moderately calcified annulus. Trileaflet; mildly thickened, mildly calcified leaflets.  Doppler:   There was mild to moderate stenosis.   There was mild regurgitation.    VTI ratio of LVOT to aortic valve: 0.29. Valve area (VTI): 0.73 cm^2. Indexed valve area (VTI): 0.44 cm^2/m^2. Peak velocity ratio of LVOT to aortic valve: 0.39. Valve area (Vmax): 1 cm^2. Indexed valve area (Vmax): 0.6 cm^2/m^2. Mean velocity ratio of LVOT to aortic valve: 0.32. Valve area (Vmean): 0.81 cm^2. Indexed valve area (Vmean): 0.49 cm^2/m^2.    Mean gradient (S): 13 mm Hg. Peak gradient (S): 22 mm Hg.  ------------------------------------------------------------------- Aorta:   Aortic root: The aortic root was normal in size.  ------------------------------------------------------------------- Mitral valve:   Normal thickness leaflets .  Doppler:  There was moderate regurgitation.    Peak gradient (D): 6 mm Hg.  ------------------------------------------------------------------- Left atrium:  The atrium was severely dilated.  ------------------------------------------------------------------- Atrial septum:  No defect or patent foramen ovale was identified.   ------------------------------------------------------------------- Right ventricle:  The cavity size was normal. Systolic function was mildly reduced.  ------------------------------------------------------------------- Pulmonic valve:    The valve appears to be grossly normal. Doppler:  There was mild regurgitation.  ------------------------------------------------------------------- Tricuspid valve:   Mildly thickened leaflets.  Doppler:  There was moderate-severe regurgitation.  ------------------------------------------------------------------- Right atrium:  The atrium was moderately dilated.  ------------------------------------------------------------------- Pericardium:  There was no pericardial effusion.  ------------------------------------------------------------------- Systemic veins: Inferior vena cava: The vessel was normal in size. The respirophasic diameter changes were in the normal range (>= 50%), consistent with normal central venous pressure.  -------------------------------------------------------------------     Current medications diltiazem CD 120 daily Calcium carbonate 1500 mg once a day Catapres 0.2 twice a day Crestor 20 once a day Lasix; 60 mg once a day Ferrous sulfate 325 daily Metoprolol 25 twice a day  Review of systems Gen. Patient states she feels weak and tired Respiratory does not complain of increased shortness of breath from baseline Cardiac; no  chest pain Abdomen; no abdominal pain or diarrhea GUno dysuria no hematuria Muscle skeletal other than weakness is not complaining of joint pain. Neuro- is not complaining of dizziness headache or syncopal-type feelings Psych-she does not appear overtly depressed-Fully cognizant  Physical examination Temperature 98.4 pulse 72 respirations 22 blood pressure 141/66 weight 171.4 Gen. Elderly female in no acute distress lying comfortably in bed Her skin is warm and dry  Respiratory thoracic kyphosis air entry is shallow --small amount of diffuse congestion-there is no labored breathing Cardiac; regular irregular rate and rhythm with a 2/6 systolic murmur-she continues to have I would say2- 3+ edema up to her thighs- Abdomen; soft nontender with positive bowel sounds Extremities;  Moves extremities 4-with significant lower extremity weakness- this appears to be baseline again does have fairly significant edema lower extremities. Neurologic appears grossly intact speech is clear.  Psych she is alert and oriented pleasant  and appropriate- Quite adamant about her wishes per discussion with me her family and nursing  Impression/plan  CHF complicated with chronic renal failure-some element of failure to thrive-as noted above patient has made clear that she essentially wants more comfort care with no aggressive follow-up by urology for her bladder mass-nephrology for her renal issues.  She also does not want any further hospitalization.  Per extensive discussion as noted above will order a hospice consult-.  This was discussed as well extensively with her family including her daughter who are in agreement.  She does have some mild chest congestion she is open to have an x-ray done for follow-up of this.  We will also order updated lab work tomorrow including a CBC and metabolic panel although down the road we may minimize this as well per patient's comfort level.  Clinically she does not  appear to be un stable but very weak and slowly declining-at this point continue to monitor vital signs pulse ox every shift.--And monitor comfort level with again emphasis on comfort.  We will continue current medications including the Lasix 60 mg a day for CHF edema and weight gain-as well as blood pressure medicine including Catapres-.  In regards to atrial fibrillation will continue her Lopressor as well as diltiazem as well.  We will discontinue  physical therapy patient states that she does not really want to go through that anymore and makes her uncomfortable  [id number]-of note greater than 40 minutes spent assessing patient-discussing her status with nursing staff as well as with her family-an extensive discussion with patient and family at bedside about patient's wishes for essentially comfort care.  Reviewing her chart-and coordinating a plan of care again with extensive patient and family in put--of note greater than 50% of time spent coordinating plan of care   Addendum-we have received the results of the chest x-ray which does not really show any new process it does show continued moderate bilateral pleural effusions which apparently are relatively unchanged   .

## 2015-07-29 ENCOUNTER — Encounter: Payer: Self-pay | Admitting: Internal Medicine

## 2015-08-01 ENCOUNTER — Non-Acute Institutional Stay (SKILLED_NURSING_FACILITY): Payer: Medicare Other | Admitting: Internal Medicine

## 2015-08-01 DIAGNOSIS — N184 Chronic kidney disease, stage 4 (severe): Secondary | ICD-10-CM

## 2015-08-01 DIAGNOSIS — Z7189 Other specified counseling: Secondary | ICD-10-CM

## 2015-08-05 NOTE — Progress Notes (Addendum)
Patient ID: Tracy [name], female   DOB: March 30, 1928, 82 y.o.   MRN: [id number]                PROGRESS NOTE  DATE:  08/01/2015             FACILITY: [facility]                  LEVEL OF CARE:   SNF   Acute Visit           CHIEF COMPLAINT:  Review of renal function, requested by the patient.      HISTORY OF PRESENT ILLNESS:  This is a patient whom we admitted to the building last month.  She is a pleasant, 82 year-old.  She was admitted with acute on chronic renal failure secondary to ATN.    She also has a bladder mass and mild hydronephrosis.  When I last saw her two weeks ago, I repeated her renal ultrasound.  I had asked for a renal consult here.    Last week, I was called to a report by our service that the patient had requested end-of-life conference.  The family was apparently present.    The patient states to me today that she wants to be certain that her wishes are known.  She tells me that she does not wish dialysis or anymore procedures.  She simply wants to "go home".  She seems to be of right mind.  She is focused and direct.  She states she is tired of the discomfort of the way she is living including her edema, difficulty swallowing.      LABORATORY DATA:   Last lab work showed a BUN of 69, creatinine 3.96.  This is stable to slightly improved.  Potassium was 4.9.    Hemoglobin was 8.3.    Estimated GFR was 9.    PHYSICAL EXAMINATION:   VITAL SIGNS:     PULSE:  85.   RESPIRATIONS:  20.     02 SATURATIONS:  95% on 2 L.   GENERAL APPEARANCE:  The patient is not in any distress.  Oxygen on.   CHEST/RESPIRATORY:  Tremendous kyphosis.  Decreased air entry bilaterally.      CARDIOVASCULAR:   CARDIAC:  Her heart sounds are almost inaudible.  Her JVP is elevated.  She has massive coccyx edema, edema extending up into her flanks which is pitting.      GASTROINTESTINAL:   ABDOMEN:  Soft, nontender.       SKIN:   INSPECTION:  Her lower legs are wrapped,  apparently without wounds.    ASSESSMENT/PLAN:                    Chronic renal failure, which is at dialysis stage.  The patient does not wish it.  In terms of comfort, I think it would be reasonable to try and increase her Lasix to see if we can get some of the edema off.  At this point, I do not think her overall renal function is important.  Given her end-of-life wishes, I do not think Nephrology is also needed at this point.    The patient has requested Hospice.  I discussed this with the Education officer, museum.  They are meeting on Friday.  She will need a [employer] form.

## 2015-08-06 ENCOUNTER — Other Ambulatory Visit: Payer: Self-pay | Admitting: *Deleted

## 2015-08-06 MED ORDER — MORPHINE SULFATE (CONCENTRATE) 20 MG/ML PO SOLN: ORAL | Status: AC

## 2015-08-06 NOTE — Telephone Encounter (Signed)
[facility] 

## 2015-08-22 DEATH — deceased

## 2015-09-06 ENCOUNTER — Ambulatory Visit: Payer: Medicare Other | Admitting: Cardiovascular Disease

## 2016-08-04 IMAGING — US US RENAL
1 series · 14 of 25 positions shown · non-contrast
Comparison: 06/28/2015

CLINICAL DATA: Left-sided hydronephrosis detected by prior
ultrasound. History of bladder carcinoma.

EXAM:
RENAL / URINARY TRACT ULTRASOUND COMPLETE

[Series 1: us renal · 0.22mm/px · 14 of 55 slices shown]
[im 1/55]
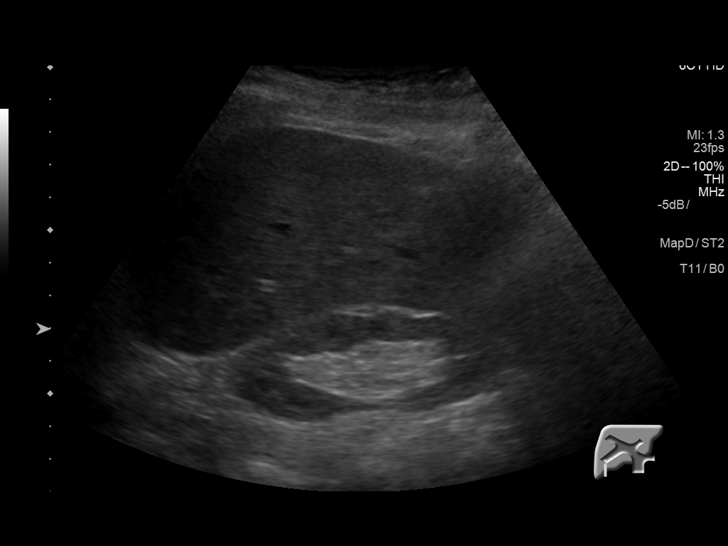
[im 5/55]
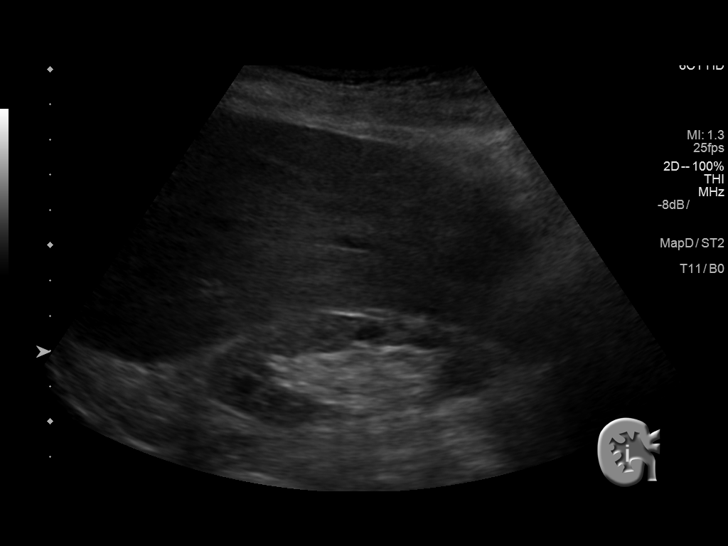
[im 10/55]
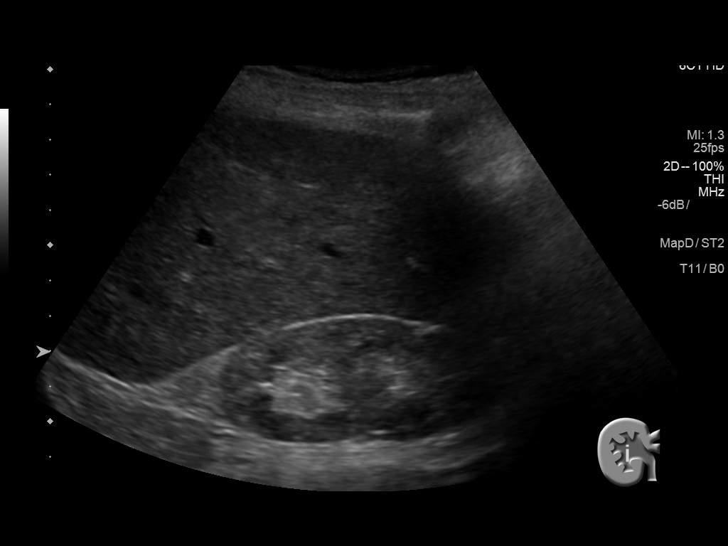
[im 14/55]
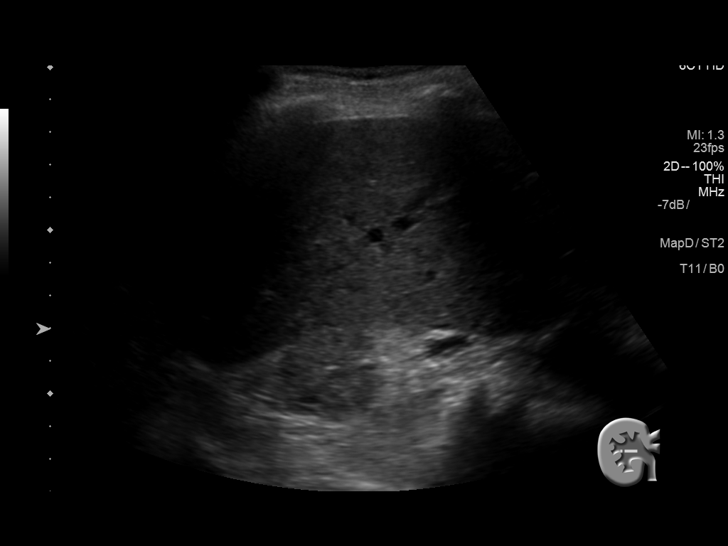
[im 19/55]
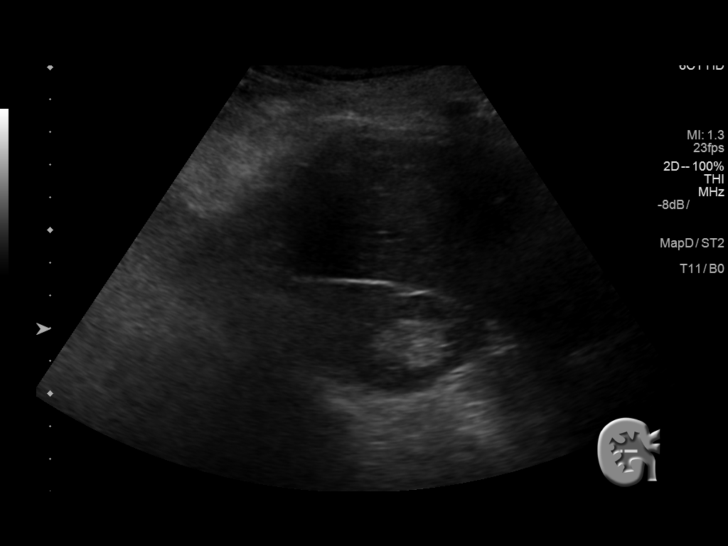
[im 21/55]
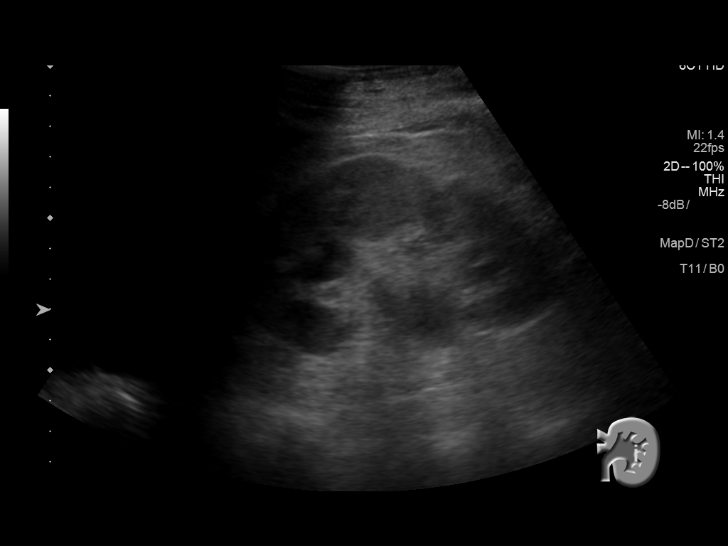
[im 25/55]
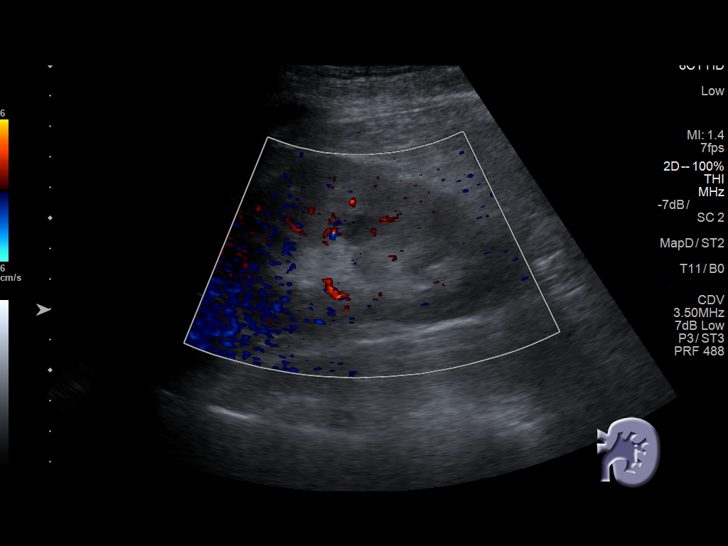
[im 30/55]
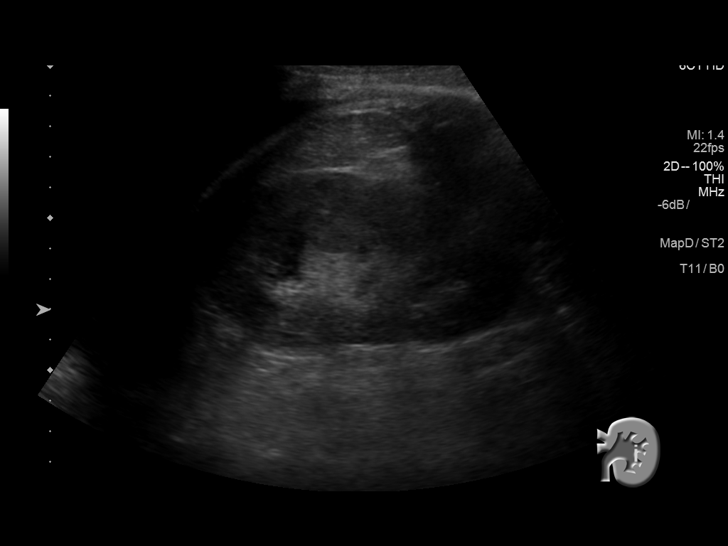
[im 34/55]
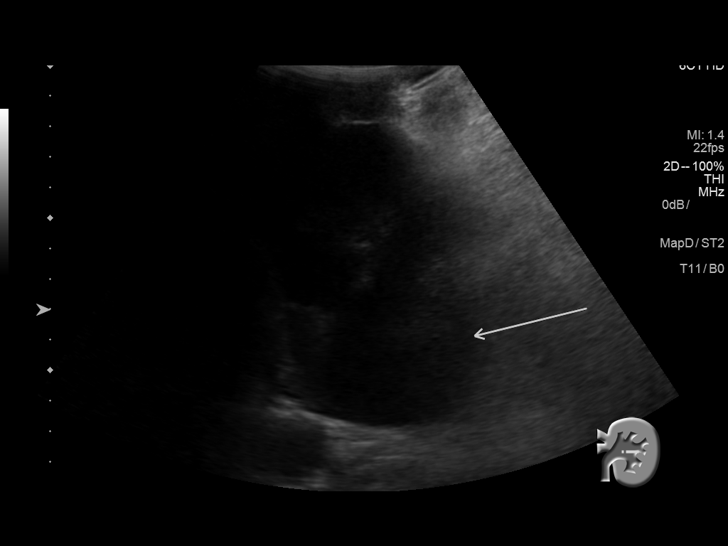
[im 37/55]
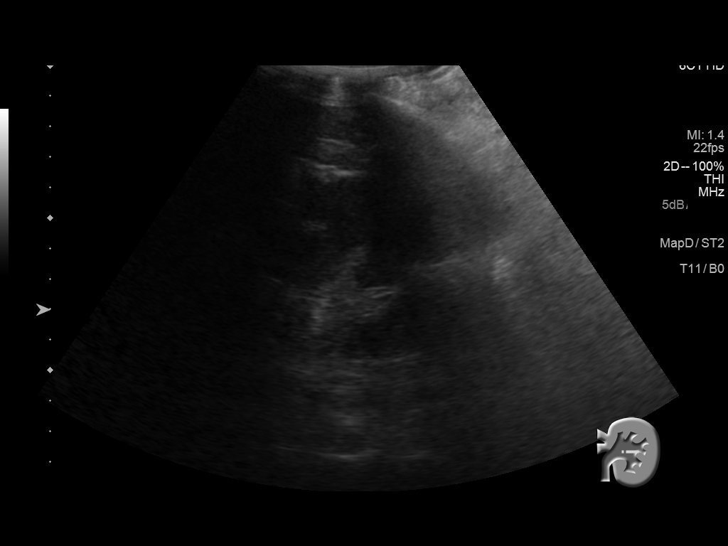
[im 41/55]
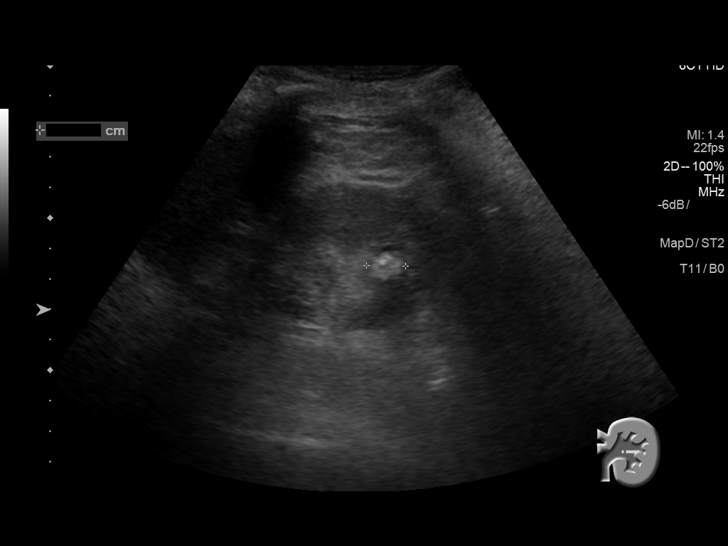
[im 46/55]
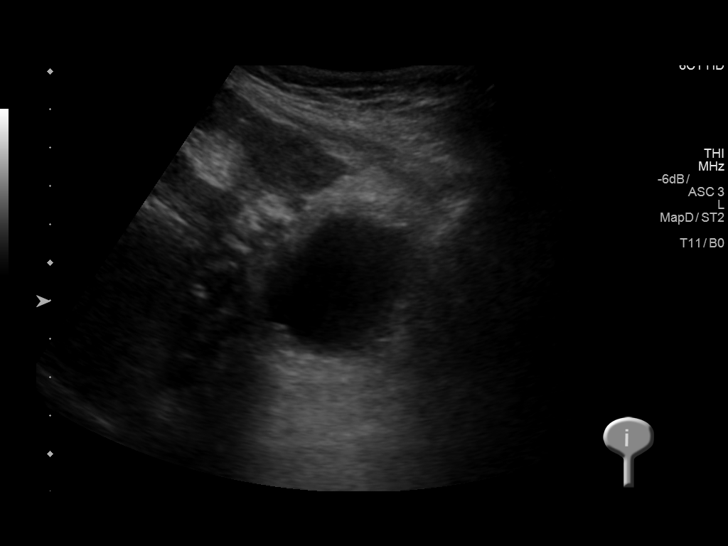
[im 50/55]
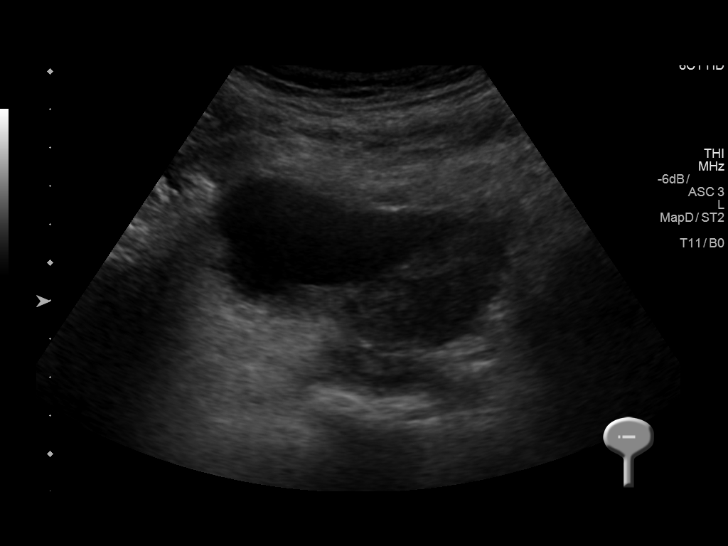
[im 55/55]
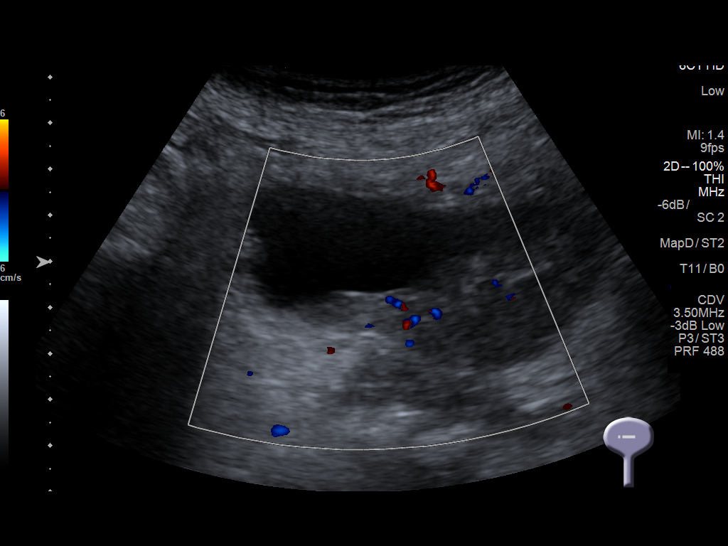

[14 of 25 positions shown; findings below may reference images not displayed]

FINDINGS: Right Kidney:

Length: 8.7 cm. Echogenic and atrophic right kidney shows stable
appearance since the prior study with cortical thinning also
present. Findings are consistent with chronic kidney disease. No
hydronephrosis.

Left Kidney:

Length: 11 cm. Mild left-sided hydronephrosis appears similar to
slightly improved compared to the prior study.

Bladder:

There remains an area of irregular wall thickening of the posterior
bladder wall which is eccentric and more prominent to the left of
midline. This remains suspicious for a large bladder mass
potentially measuring upwards of 5.5 cm.
IMPRESSION: 1. Stable to slightly improved appearance of mild left-sided
hydronephrosis.
2. Stable atrophic right kidney without hydronephrosis.
3. Stable appearance of probable bladder mass along the posterior
bladder wall and more prominent to the left of midline.

## 2016-08-31 IMAGING — DX DG CHEST 1V
1 series · 1 of 1 positions shown · non-contrast
Comparison: 07/04/2015 and earlier.

CLINICAL DATA: 87-year-old female with cough congestion and
shortness of breath. Initial encounter.

EXAM:
CHEST 1 VIEW

[chest ap]
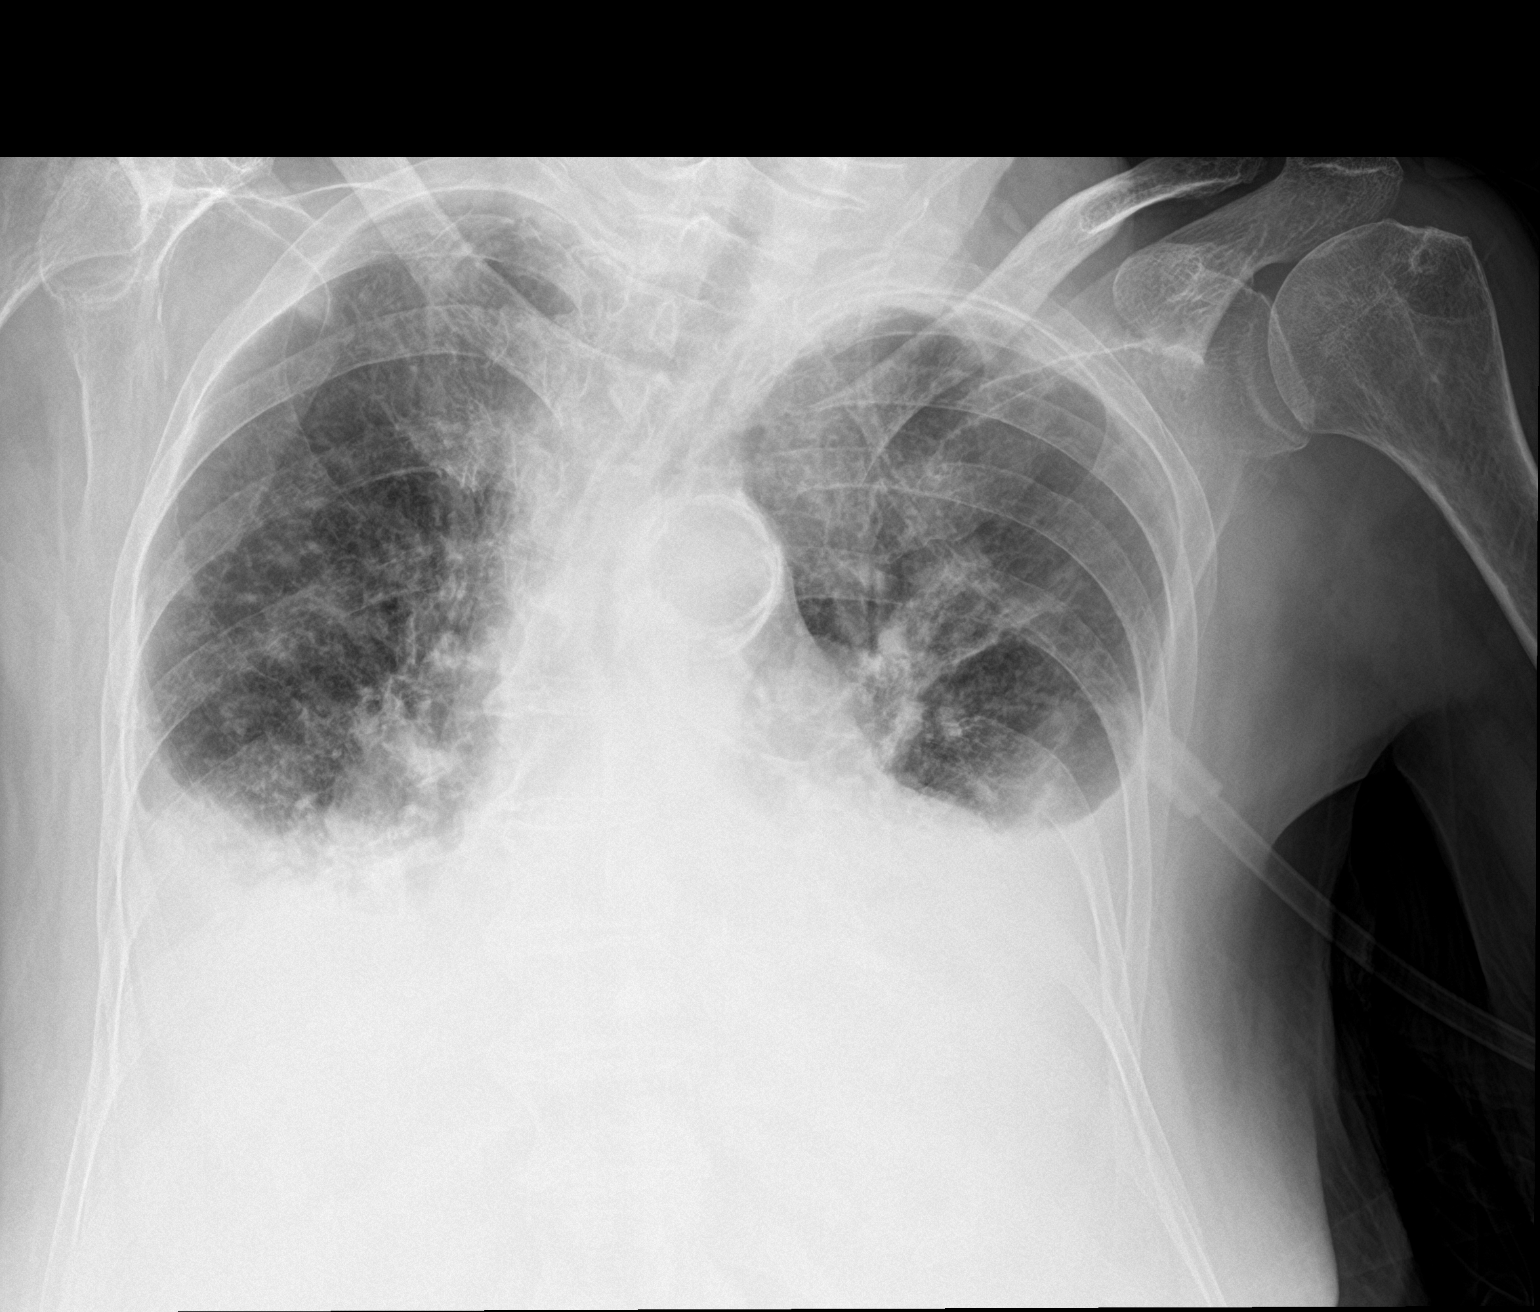

[1 of 1 positions shown; findings below may reference images not displayed]

FINDINGS: Seated AP view of the chest at 2869 hours. Continued bilateral
pleural effusions, moderate, with dense opacification of both lung
bases. Pulmonary vascularity is stable, no overt edema. Stable
visualized mediastinal contour peer E calcified aortic
atherosclerosis. No pneumothorax.
IMPRESSION: Continued and moderate volume bilateral pleural effusions. No new
cardiopulmonary abnormality identified.
